# Patient Record
Sex: Male | Born: 1940
Health system: Southern US, Community
[De-identification: ages and names within clinical notes are randomized; demographics above are authoritative.]

## PROBLEM LIST (undated history)

## (undated) DIAGNOSIS — E039 Hypothyroidism, unspecified: Secondary | ICD-10-CM

## (undated) DIAGNOSIS — Z951 Presence of aortocoronary bypass graft: Secondary | ICD-10-CM

## (undated) DIAGNOSIS — Z9889 Other specified postprocedural states: Secondary | ICD-10-CM

## (undated) DIAGNOSIS — R112 Nausea with vomiting, unspecified: Secondary | ICD-10-CM

## (undated) DIAGNOSIS — I2510 Atherosclerotic heart disease of native coronary artery without angina pectoris: Secondary | ICD-10-CM

## (undated) DIAGNOSIS — Z87442 Personal history of urinary calculi: Secondary | ICD-10-CM

## (undated) DIAGNOSIS — M1990 Unspecified osteoarthritis, unspecified site: Secondary | ICD-10-CM

## (undated) DIAGNOSIS — I10 Essential (primary) hypertension: Secondary | ICD-10-CM

## (undated) DIAGNOSIS — E785 Hyperlipidemia, unspecified: Secondary | ICD-10-CM

## (undated) DIAGNOSIS — G4733 Obstructive sleep apnea (adult) (pediatric): Secondary | ICD-10-CM

## (undated) HISTORY — PX: CHOLECYSTECTOMY: SHX55

## (undated) HISTORY — DX: Atherosclerotic heart disease of native coronary artery without angina pectoris: I25.10

## (undated) HISTORY — DX: Unspecified osteoarthritis, unspecified site: M19.90

## (undated) HISTORY — DX: Essential (primary) hypertension: I10

## (undated) HISTORY — DX: Obstructive sleep apnea (adult) (pediatric): G47.33

## (undated) HISTORY — DX: Hyperlipidemia, unspecified: E78.5

## (undated) HISTORY — PX: JOINT REPLACEMENT: SHX530

## (undated) HISTORY — DX: Presence of aortocoronary bypass graft: Z95.1

## (undated) HISTORY — DX: Hypothyroidism, unspecified: E03.9

---

## 1988-05-09 DIAGNOSIS — I2510 Atherosclerotic heart disease of native coronary artery without angina pectoris: Secondary | ICD-10-CM

## 1988-05-09 HISTORY — DX: Atherosclerotic heart disease of native coronary artery without angina pectoris: I25.10

## 1988-08-04 HISTORY — PX: CORONARY ANGIOPLASTY: SHX604

## 1988-10-21 HISTORY — PX: CARDIAC CATHETERIZATION: SHX172

## 1988-12-02 HISTORY — PX: CORONARY ANGIOPLASTY: SHX604

## 2000-03-20 ENCOUNTER — Encounter: Payer: Self-pay | Admitting: Cardiology

## 2000-03-20 ENCOUNTER — Encounter: Admission: RE | Admit: 2000-03-20 | Discharge: 2000-03-20 | Payer: Self-pay | Admitting: Cardiology

## 2000-03-28 ENCOUNTER — Ambulatory Visit (HOSPITAL_COMMUNITY): Admission: RE | Admit: 2000-03-28 | Discharge: 2000-03-28 | Payer: Self-pay | Admitting: Cardiology

## 2000-03-28 HISTORY — PX: CARDIAC CATHETERIZATION: SHX172

## 2000-05-09 DIAGNOSIS — Z951 Presence of aortocoronary bypass graft: Secondary | ICD-10-CM

## 2000-05-09 HISTORY — PX: CORONARY ARTERY BYPASS GRAFT: SHX141

## 2000-05-09 HISTORY — DX: Presence of aortocoronary bypass graft: Z95.1

## 2000-05-15 ENCOUNTER — Encounter: Payer: Self-pay | Admitting: Cardiothoracic Surgery

## 2000-05-17 ENCOUNTER — Inpatient Hospital Stay (HOSPITAL_COMMUNITY): Admission: RE | Admit: 2000-05-17 | Discharge: 2000-05-22 | Payer: Self-pay | Admitting: Cardiothoracic Surgery

## 2000-05-17 ENCOUNTER — Encounter: Payer: Self-pay | Admitting: Cardiothoracic Surgery

## 2000-05-18 ENCOUNTER — Encounter: Payer: Self-pay | Admitting: Cardiothoracic Surgery

## 2000-05-19 ENCOUNTER — Encounter: Payer: Self-pay | Admitting: Cardiothoracic Surgery

## 2000-05-21 ENCOUNTER — Encounter: Payer: Self-pay | Admitting: Cardiothoracic Surgery

## 2000-05-22 ENCOUNTER — Encounter: Payer: Self-pay | Admitting: Cardiothoracic Surgery

## 2000-06-12 ENCOUNTER — Encounter: Payer: Self-pay | Admitting: Cardiology

## 2000-06-12 ENCOUNTER — Encounter: Admission: RE | Admit: 2000-06-12 | Discharge: 2000-06-12 | Payer: Self-pay | Admitting: Cardiology

## 2001-06-06 ENCOUNTER — Ambulatory Visit (HOSPITAL_COMMUNITY): Admission: RE | Admit: 2001-06-06 | Discharge: 2001-06-06 | Payer: Self-pay | Admitting: Gastroenterology

## 2005-12-15 ENCOUNTER — Encounter: Admission: RE | Admit: 2005-12-15 | Discharge: 2005-12-15 | Payer: Self-pay | Admitting: Family Medicine

## 2010-01-07 HISTORY — PX: NM MYOVIEW LTD: HXRAD82

## 2010-03-11 ENCOUNTER — Encounter: Admission: RE | Admit: 2010-03-11 | Discharge: 2010-03-11 | Payer: Self-pay | Admitting: Cardiology

## 2010-03-17 ENCOUNTER — Ambulatory Visit (HOSPITAL_COMMUNITY): Admission: RE | Admit: 2010-03-17 | Discharge: 2010-03-17 | Payer: Self-pay | Admitting: Cardiology

## 2010-03-17 HISTORY — PX: CARDIAC CATHETERIZATION: SHX172

## 2010-07-20 LAB — T3: T3, Total: 67.7 ng/dl — ABNORMAL LOW (ref 80.0–204.0)

## 2010-07-20 LAB — T4: T4, Total: 8.2 ug/dL (ref 5.0–12.5)

## 2010-09-24 NOTE — H&P (Signed)
[place]. [facility]  Patient:    [name] [name], [name] [name]                         MRN: [id number] Adm. Date:  05/17/00 Attending:  Gwenith Daily. Tyrone Sage, M.D. Dictator:   Marlowe Kays, P.A. CC:         Desma Maxim, M.D.             Thereasa Solo. Little, M.D.                         History and Physical  DATE OF BIRTH:  12/07/1940  CHIEF COMPLAINT:  Coronary artery disease.  HISTORY OF PRESENT ILLNESS:  [name] [name] is a pleasant 70 year old white male referred by Dr. Clarene Duke for evaluation of CAD. The patient has known CAD first diagnosed in 1990, having had an angioplasty of the RCA in 1990. In 1999, he had a positive treadmill test, but refused catheterization. Since October 2001, he noticed increase in shortness of breath, especially with exertion. He also notices a burning sensation in his chest without radiation. No nausea or diaphoresis. A cardiac catheterization on March 28, 2000 by Dr. Clarene Duke demonstrated 3-vessel CAD with 80% middle AD, 50-60% proximal circumflex, normal OM1 and 80-90% proximal OM2. The RCA is diffusely diseased in the mid and distal portions with areas of obstruction up to 80% along the acute marginal. The PDA is normal. Overall ______ ventricular function is present. Based on the signs and symptoms, as well as the lab findings, Dr. Tyrone Sage recommended CABG as the best treatment. He complains of occasional tingling bilaterally in his hands. Other than the mention above, no other cardiac complaints are experienced by him.  PAST MEDICAL HISTORY: 1. 3-vessel CAD. 2. Hypertension. 3. Hypercholesterolemia. 4. Ejection fraction 55%. 5. COPD. 6. Status post hyperthyroid crisis, remote. 7. Hypothyroidism. 8. History of pneumonia, 1981.  MEDICATIONS:  Aspirin 81 mg p.o. q.d., lipitor 20 mg p.o. q.d., Monopril 10 mg p.o. q.d., tiazac 180 mg p.o. q.d., Toprol XL 25 mg p.o. q.d., and Levothroid 100 mcg.  PAST SURGICAL HISTORY:  Status  post cardiac catheterization March 28, 2000, status post cholecystectomy 1997, and status post PTCA of the RCA in 1990.  ALLERGIES:  The patient has a questionable reaction to dye (hyperactivity of the thyroid).  REVIEW OF SYSTEMS:  See HPI and past medical history for significant positives. No diabetes. No kidney disease.  FAMILY HISTORY:  Mother dead CAD, hypertension, history of breast cancer. Father dead CAD, hypertension, age 35.  SOCIAL HISTORY:  Married. She has one daughter with MVP. 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 He denies any tobacco or alcohol intake. In fact, he quit in 1990 the use of tobacco, as he used to smoke 1 pack a day for over 40 years.  PHYSICAL EXAMINATION:  GENERAL:  Well-developed, well-nourished, 70 year old, white male in no acute distress. Alert and oriented x 3.  VITAL SIGNS:  Blood pressure 140/80, pulse 64, respirations 16.  HEENT:  Normocephalic, atraumatic. PERRLA. EOMI. Funduscopic exam within normal limits.  NECK:   Supple, no JVD, bruits, thyromegaly or lymphadenopathy.  CHEST:  Symmetrical on inspiration.  LUNGS:  Clear to auscultation bilaterally with distant sounds. Respirations nonlabored.  CARDIOVASCULAR:  Regular rate and rhythm, a 2/6 harsh murmur loudest at the left sternal border between the third and fourth intercostal.  ABDOMEN:  Soft, nontender. Bowel sounds x 4,  no hepatosplenomegaly, masses palpable or abdominal bruits.  GU/RECTAL:  Deferred.  EXTREMITIES:  No cyanosis, clubbing or edema.  SKIN:  Shows no ulcerations or warm temperature. Normal hair pattern. PERIPHERAL PULSES:  Carotid 2+ bilaterally without bruits. Femoral 2+ bilaterally without bruits. Popliteal, dorsalis pedis and posterior tibialis 2+ bilaterally.  NEUROLOGIC:  Grossly normal. Normal gait. DTRs 2+ bilaterally. Muscle strength 5/5.  ASSESSMENT/PLAN:  A 70 year old white male with 3-vessel CAD who will undergo CABG on May 17, 2000 by Dr. Tyrone Sage. Dr. Tyrone Sage has seen and evaluated this patient prior to the admission and has explained the risks and benefits involved in the procedure and the patient has agreed to continue. DD:  05/17/00 TD:  05/17/00 Job: [id number]

## 2010-09-24 NOTE — Op Note (Signed)
[place]. [facility]  Patient:    [name] [name], [name] [name]                       MRN: [id number] Proc. Date: 05/17/00 Adm. Date:  [id number] Attending:  Waldo Laine CC:         Thereasa Solo. Little, M.D.   Operative Report  PREOPERATIVE DIAGNOSIS:  Coronary occlusive disease.  POSTOPERATIVE DIAGNOSIS:  Coronary occlusive disease.  OPERATION PERFORMED:  Coronary artery bypass grafting x 3 with left internal mammary artery to the left anterior descending coronary artery, reversed saphenous vein to the first obtuse marginal coronary artery, reversed saphenous vein graft to the posterior descending coronary artery. SURGEON:  Gwenith Daily. Tyrone Sage, M.D.  FIRST ASSISTANT:  Mikey Bussing, M.D.  SECOND ASSISTANT:  Loura Pardon, P.A.  ANESTHESIA:  General.  INDICATIONS FOR PROCEDURE:  The patient is a 70 year old male who presented with increasing anginal discomfort and sought medical attention from Dr. Clarene Duke who performed cardiac catheterization.  This demonstrated diffuse three vessel coronary artery disease with 70 to 80% stenosis of the LAD, proximal circumflex, a small second obtuse marginal of 80%, a right coronary artery with sequential 70 to 80% stenoses.  Overall ventricular function appeared preserved.  Because of the patients three-vessel coronary artery disease, coronary artery bypass grafting was recommended.  The patient agreed and signed informed consent.  Originally, he had been scheduled for surgery in December; however, called back and requested that we wait until January.  DESCRIPTION OF PROCEDURE:  The patient underwent general endotracheal anesthesia without incident.  The skin of the chest and legs was prepped with Betadine and draped in the usual sterile manner.  Vein was harvested first from the right lower extremity and was of excellent quality and caliiber.  A median sternotomy was performed and the left internal mammary artery  was dissected down as a pedicle graft.  The distal artery was divided and had good free flow.  The pericardium was opened.  Overall ventricular function appeared preserved.  The patient was systemically heparinized.  The ascending aorta and the right atrium were cannulated in the aortic root.  A bent cardioplegia needle was introduced into the ascending aorta.  The patient was placed on cardiopulmonary bypass at 2.4 L per minute per meter squared.  Sites for anastomosis were selected and dissected out of the epicardium.  The patients body temperature was then cooled to 30 degrees.  The aortic crossclamp was applied.  500 cc of cold blood potassium cardioplegia was administered with rapid diastolic arrest of the heart.  Myocardial and septal temperature was monitored throughout the crossclamp period.  Attention was turned first to the circumflex coronary artery.  The first OM was a very large dominant vessel on the lateral wall.  This vessel was opened and admitted a 1.5 mm probe without difficulty.  Using a running 7-0 Prolene and distal anastomosis was performed.  A much smaller second obtuse marginal was evident.  This appeared to be less than 1 mm in size and thought too small for bypass.  Attention was then turned to the posterior descending coronary artery, which was opened and admitted a 1.5 mm probe.  Using a running 7-0 Prolene suture, distal anastomosis was performed with a segment of reversed saphenous vein graft.  Attention was then turned to the left anterior descending coronary artery between the mid and distal third.  The vessel was opened and admitted a 1.5 mm probe.  Using  a running 8.0 Prolene, the left internal mammary artery was anastomosed to the left anterior descending coronary artery.  With release of the Edwards bulldog on the mammary artery, there was appropriate rise in myocardial septal temperature.  The aortic cross clamp was removed.  Total cross clamp time was  46 minutes.  The patient spontaneously converted to a sinus rhythm.   A partial occlusion clamp was placed on the ascending aorta.  Two punch aortotomies were performed.  Each of the two vein grafts was anastomosed to the ascending aorta.  Air was evacuated from the grafts.  The partial occlusion clamp was removed.  Sites of anastomosis were inspected and free of bleeding.  The patient was then ventilated and weaned from cardiopulmonary bypass without difficulty.  He remained hemodynamically stable on low-dose dopamine and was decannulated in the usual fashion.  Protamine sulfate was administered.  With the operative field hemostatic, two atrial and two ventricular pacing wires were applied. Graft markers were applied.  A left pleural tube and two mediastinal tubes were left in place.  The sternum was closed with #6 stainless steel wire, the fascia was closed with interrupted 0 Vicryl and running 3-0 Vicryl in the subcutaneous tissue and 4-0 subcuticular stitch in the skin edges.  Dry dressings were applied.  The patient was transferred to the surgical intensive care unit for further postoperative care.  It should be noted that the patients left pleural space was totally obliterated which is consistent with findings of scarring on his chest x-ray on the left. DD:  05/17/00 TD:  05/17/00 Job: [id number]

## 2010-09-24 NOTE — Cardiovascular Report (Signed)
[place]. [facility]  Patient:    [name] [name], [name] [name]                       MRN: [id number] Proc. Date: 03/28/00 Adm. Date:  [id number] Attending:  Loreli Dollar CC:         Desma Maxim, M.D.  Cardiac Catheterization Lab  CVTS   Cardiac Catheterization  INDICATIONS FOR TEST:  [name] [name] is a 70 year old male who had angioplasty to his RCA in 1990.  He had a positive treadmill in 1999 but refused cardiac catheterization at that time.  He now has developed exertional chest pain and comes in for outpatient cardiac catheterization.  PROCEDURE: 1. Left heart catheterization. 2. Selective right and left coronary arteriography. 3. Ventriculography in the RAO projection.  COMPLICATIONS:  None.  EQUIPMENT:  The 6-French Judkins configuration catheters.  DESCRIPTION OF PROCEDURE:  The patient was prepped and draped in the usual sterile fashion exposing the right groin.  Applying local anesthetic of 1% Xylocaine, the Seldinger technique was employed, and a 6-French introducer sheath was placed into the right femoral artery.  Selective right and left coronary arteriography and ventriculography in the RAO projection was performed.  RESULTS: 1. Hemodynamic monitoring:  Central aortic pressure was 135/68.  Left    ventricular pressure was 135/15 with no aortic valve gradient noted at the    time of pullback. 2. Ventriculography:  Ventriculography in the RAO projection revealed slight    dilatation of the left ventricular cavity.  There was normal left    ventricular systolic function.  The ejection fraction was greater than 55%.    The end diastolic pressure was 16. 3. Coronary arteriography:  There was dense calcification in the left main,    LAD, circumflex, and right coronary artery.    a. Left main:  Normal.    b. LAD:  The LAD had an area of mid 80% narrowing with proximal       irregularities.       The first diagonal also was small with mild  irregularity.    c. Circumflex:  The circumflex had mild irregularities in the mid and       distal portion.  The first OM was a large vessel and was free of       disease.  The second OM was small with a proximal 80-90% narrowing and       diffuse disease within the OM itself.    d. RCA:  The RCA was diffusely diseased in its mid and distal portion with       the maximum obstruction being about 80% at the acute margin of the       heart.  The PDA was free of disease.  The posterolateral branches were       free of disease.  CONCLUSIONS: 1. Dense calcific three-vessel coronary disease. 2. Normal left ventricular systolic function.  At this point, the patient will be referred to CVTS for consideration of bypass surgery.  He will be treated as an outpatient today since his lesions are noncritical although severe.DD:  03/28/00 TD:  03/28/00 Job: [id number]

## 2010-09-24 NOTE — Discharge Summary (Signed)
[name] [name]. [name]  Patient:    [name], [name] [name]                       MRN: [id number] Adm. Date:  [id number] Disc. Date: 05/22/00 Attending:  Waldo Laine Dictator:   Lissa Merlin, P.A. CC:         Thereasa Solo. Little, M.D.  Desma Maxim, M.D.   Discharge Summary  DATE OF BIRTH:  2040-07-06  CARDIOLOGIST:  Thereasa Solo. Little, M.D.  PRIMARY CARE PHYSICIAN:  Desma Maxim, M.D.  PRIMARY ADMISSION DIAGNOSES: 1. Unstable angina. 2. Progressive coronary artery disease. 3. Dyspnea on exertion.  PAST MEDICAL HISTORY:  1. Known three-vessel coronary artery disease with previous percutaneous interventions.  2. Ejection fraction 55%. 3. Hypertension.  4. Hypercholesterolemia.  5. Chronic obstructive pulmonary disease.  6. Hypothyroidism.  7. 40-pack-year smoker.  8. No history of history of hypertension.  PROCEDURES:  CABG x 3 on May 17, 2000, with the following grafts:  Left internal mammary artery to left anterior descending, saphenous vein graft to first obtuse marginal, saphenous vein graft to posterior descending.  NEW DIAGNOSES THIS ADMISSION: 1. Approximately 20% right pneumothorax diagnosed on May 19, 2000,    improved. 2. Mild postoperative anemia, improved.  BRIEF HISTORY:  [name] [name] is a pleasant, 70 year old, white male with known coronary artery disease and previous percutaneous interventions.  Since October of 2001, he complained of increasing shortness of breath, especially with exertion and a burning sensation in his chest.  A repeat cardiac catheterization on March 28, 2000, by Gaspar Garbe B. Little, M.D., showed progression of his disease.  He was referred to [facility] B. Tyrone Sage, M.D., for surgical evaluation.  After reviewing the catheterization data and examining [name] [name] Tyrone Sage, M.D., recommended CABG as the best treatment alternative.  The risks, benefits, details, and alternatives of surgery  were discussed and it was agreed to proceed.  HOSPITAL COURSE:  [name] [name] came to [facility] for the elective procedure as scheduled on May 17, 2000.  There were no complications.  He was transferred to the SICU in stable condition. Postoperatively, [name] [name] responded well to routine care and was transferred to unit 2000 on postoperative day #1.  On postoperative day #2, an approximately 20% right pneumothorax was observed on chest x-ray.  This did not result in any clinical respiratory problems.  [name] [name] continued to do well.  A follow-up chest x-ray showed improvement of the pneumothorax.  By postoperative day #4, [name] [name] is doing very well.  He feels well with no complaints.  He is walking and taking p.o. well.  His bowels are moving.  The vital signs are stable.  He is afebrile.  He is in sinus rhythm.  He is almost back to his preoperative weight.  The anemia is stable.  The physical exam is satisfactory.  The wounds are healing well.  It is anticipated that he will be suitable for discharge pending satisfactory morning rounds on Monday, May 22, 2000.  DISCHARGE MEDICATIONS: 1. Toprol XL 25 mg one p.o. q.a.m. 2. Synthroid 100 mcg one p.o. q.d. 3. Enteric-coated aspirin 81 mg one p.o. q.d. 4. Folic acid 1 mg p.o. q.d. 5. Lipitor 20 mg one p.o. q.d. 6. Tylox one to two p.o. q.4-6h. p.r.n. for pain.  ALLERGIES:  SEPTRA, DOXYCYCLINE, and CONTRAST DYE.  ACTIVITY:  [name] [name] is told to do no heavy lifting over 10 pounds and no  driving.  He is told to walk daily and use his incentive spirometer daily.  DIET:  He is told to maintain a low-fat, low-salt, and low-cholesterol diet.  WOUND CARE:  He is told he can shower and to use mild soap and water only.  To call the office if he notices anything unusual with his wound, such as increasing redness, swelling, drainage, or fever.  He is told to get a chest x-ray when he sees Gaspar Garbe B. Little, M.D., his  cardiologist, in two weeks and to bring it with him to see Ramon Dredge B. Tyrone Sage, M.D.  CONDITION ON DISCHARGE:  He is stable and improved.  FOLLOW-UP:  1. [name] [name] is instructed to call Gaspar Garbe B. Little, M.D., to arrange a two-week appointment.  2. [name] [name] will see Gwenith Daily. Tyrone Sage, M.D., in three weeks.  The office will call with the appointment day and time. He is told to get a chest x-ray when he sees Gaspar Garbe B. Little, M.D., his cardiologist, in two weeks and to bring it with him to see Ramon Dredge B. Tyrone Sage, M.D. DD:  05/21/00 TD:  05/21/00 Job: [id number]

## 2010-09-24 NOTE — Procedures (Signed)
[facility]  Patient:    [name] [name], [name] [name] Visit Number: [id number] MRN: [id number]          Service Type: Attending:  Verlin Grills, M.D. Dictated by:   Verlin Grills, M.D. Proc. Date: 06/06/01                             Procedure Report  REFERRING PHYSICIAN:  ______  PROCEDURE INDICATION:  Mr. [name] [name] is a 70 year old male born June 09, 1940.  Mr. [name] [name] is referred to me for his first screening colonoscopy with polypectomy to prevent colon cancer.  Mr. [name] [name] reviewed our colonoscopy education film in my office, and I discussed with him the complications associated with colonoscopy and polypectomy including a 15 per 1,000 risk of bleeding and 4 per 1,000 risk of colon perforation requiring surgical repair.  Mr. [name] [name] has signed the operative permit.  MEDICATION ALLERGIES:  SULFA, DOXYCYCLINE.  CHRONIC MEDICATIONS:  Levothroid, Lipitor, aspirin, multivitamin, Etodolac, Toprol, vitamin E.  PAST MEDICAL HISTORY:  Cholecystectomy, coronary artery bypass surgery, hypercholesterolemia, hypertension, hypothyroidism.  FAMILY HISTORY: Negative for colon cancer.  ENDOSCOPIST:  Verlin Grills, M.D.  PREMEDICATION:  Versed 7 mg, Demerol 50 mg.  ENDOSCOPE:  Olympus pediatric colonoscope.  PROCEDURE:  After obtaining informed consent, [name] [name] was placed in the left lateral decubitus position.  I administered intravenous Demerol and intravenous Versed to achieve conscious sedation for the procedure.  The patients blood pressure, oxygen saturation, and cardiac rhythm were monitored throughout the procedure and documented in the medical record.  Anal inspection was normal.  Digital rectal exam was normal.  The prostate was non-nodular.  The Olympus pediatric video colonoscope was introduced into the rectum and easily advanced to the cecum.  A normal-appearing ileocecal valve was intubated and the distal ileum inspected.   Colonic preparation for the exam today was excellent.  Rectum:  Normal.  Sigmoid colon and descending colon:  Normal.  Splenic flexure:  Normal.  Transverse colon:  Normal.  Hepatic flexure: Normal.  Ascending colon: Normal.  Cecum and ileocecal valve:  Normal.  Distal ileum:  Normal.  ASSESSMENT: 1. Normal screening proctocolonoscopy to the cecum with intubation of a normal-appearing ileocecal valve and distal ileal inspection.  No endoscopic evidence for the presence of colorectal neoplasia. Dictated by:   Verlin Grills, M.D. Attending:  Verlin Grills, M.D. DD:  06/06/01 TD:  06/06/01 Job: [id number]

## 2010-09-24 NOTE — H&P (Signed)
[place]. [facility]  Patient:    [name] [name], ROUTE                       MRN: [id number] Adm. Date:  [id number] Attending:  Loreli Dollar Dictator:   Kingsley Plan, P.A. CC:         Desma Maxim, M.D.   History and Physical  CHIEF COMPLAINT:  Elective cardiac catheterization.  HISTORY OF PRESENT ILLNESS:  Patient was seen in our office on March 20, 2000 for follow-up of a CAD.  Patient has not been seen since March 1999.  At that time, he had a treadmill test that was positive for inferolateral ischemia.  He was asymptomatic.  He refused any type of cardiac catheterization at that time, and has not been seen since.  He was seen several weeks ago at Dr. Fonnie Birkenhead office and had changes on his EKG, and was referred back here to our office.  He states he has had some burning in his chest, usually with anxiety or if he had a meal and then tried to do some physical activity such as carrying his garbage can to the street.  This burning sensation is similar to what he had in 1990 when he had his angioplasty to his RCA.  Dr. Clarene Duke reviewed his EKG, which showed left anterior fascicular block, but is very similar to prior cardiograms in our chart.  He is active but not exercising regularly.  He is not smoking cigarettes.  He is taking his medications ______.  His antihypertensive drugs, according to the patient, have recently been changed to Tiazac and Monopril because of a gradual increase in his blood pressure.  PAST MEDICAL HISTORY: 1. Cholecystectomy in 1997. 2. Angioplasty in 1999 x 2 with restenosis in three months, with last    angioplasty successful with no current anginal complaints. 3. LV function normal. 4. 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 Dagoberto Ligas. 5. Hyperlipidemia.  FAMILY HISTORY:  Positive for heart disease in his mother and father.  They were also both hypertensive.  Mother had  breast cancer.  SOCIAL HISTORY:  The patient is married, has one adult child.  Has two years of college.  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  No cigarettes, no alcohol.  REVIEW OF SYSTEMS:  Weight is stable.  No claudication.  Rare nocturia.  No dysuria.  Appetite good.  No fever or chills.  PHYSICAL EXAMINATION AT THAT TIME:  GENERAL:  Healthy male, no acute distress.  LUNGS:  Completely clear.  CARDIAC:  Regular rate and rhythm with no ectopic beats.  No murmur.  ABDOMEN:  Soft with no masses, no bruits.  EXTREMITIES:  Has good pulses in lower extremities.  No edema.  NECK:  Neck veins are not distended.  No carotid bruits.  ASSESSMENT AND PLAN: 1. Recurrent anginal pain with minimal activity, suspect angina. 2. Hypertension, under adequate control on current medications. 3. Hyperlipidemia. 4. Hypothyroidism on thyroid replacement.  Patient was seen and evaluated by Dr. Clarene Duke and cardiac catheterization was set up for November 20.  Dr. Clarene Duke discussed with both Dr. Dagoberto Ligas and the patient the potential recurrence of hyperthyroid crisis as a result of the contrast media, but there is no way to prophylaxis this.  PROCEDURE:  Left heart catheterization performed on March 28, 2000 by Dr. Caprice Kluver without any difficulty.  There was dense calcification, left main/LAD/circumflex/RCA.  Left  main was normal.  LAD showed mid 80% stenosis. Distal okay.  First diagonal was small with mild irregularities.  Circumflex with mild irregularities in the circumflex (mid and distally).  OM-1 is large and okay.  OM-2 is small with proximal 80-90% and entire OM diseased.  RCA diffusely diseased with mid and distal 80% stenosis.  PDA okay.  LV with normal systolic function with EDP of 16 and EF 55-60%.  RESULTS:  Three-vessel CAD with dense calcification.  Normal LV systolic function.  Patient is in need of bypass surgery and CVTS will consult today.  DISPOSITION:  Patient will be discharged  on the day of catheterization and will follow up with CVTS in their office and to be scheduled for surgery as soon as possible. DD:  03/28/00 TD:  03/28/00 Job: [id number]

## 2010-09-24 NOTE — Op Note (Signed)
[place]. [facility]  Patient:    RORAN, WEGNER                       MRN: [id number] Adm. Date:  [id number] Attending:  Waldo Laine                           Operative Report  NO DICTATION. DD:  05/17/00 TD:  05/17/00 Job: [id number]

## 2011-03-14 ENCOUNTER — Encounter (HOSPITAL_COMMUNITY): Payer: Medicare Other

## 2011-03-14 ENCOUNTER — Ambulatory Visit (HOSPITAL_COMMUNITY)
Admission: RE | Admit: 2011-03-14 | Discharge: 2011-03-14 | Disposition: A | Discharge status: Home / Self Care | Payer: Medicare Other | Source: Ambulatory Visit | Attending: Orthopedic Surgery | Admitting: Orthopedic Surgery

## 2011-03-14 ENCOUNTER — Encounter (HOSPITAL_COMMUNITY): Payer: Self-pay

## 2011-03-14 DIAGNOSIS — Z9089 Acquired absence of other organs: Secondary | ICD-10-CM | POA: Insufficient documentation

## 2011-03-14 DIAGNOSIS — J984 Other disorders of lung: Secondary | ICD-10-CM | POA: Insufficient documentation

## 2011-03-14 DIAGNOSIS — Z951 Presence of aortocoronary bypass graft: Secondary | ICD-10-CM | POA: Insufficient documentation

## 2011-03-14 DIAGNOSIS — Z01812 Encounter for preprocedural laboratory examination: Secondary | ICD-10-CM | POA: Insufficient documentation

## 2011-03-14 DIAGNOSIS — M1710 Unilateral primary osteoarthritis, unspecified knee: Secondary | ICD-10-CM | POA: Insufficient documentation

## 2011-03-14 LAB — CBC
Hemoglobin: 13.4 g/dL (ref 13.0–17.0)
MCH: 31.8 pg (ref 26.0–34.0)
MCV: 90.7 fL (ref 78.0–100.0)
Platelets: 209 10*3/uL (ref 150–400)
RBC: 4.21 MIL/uL — ABNORMAL LOW (ref 4.22–5.81)
WBC: 6 10*3/uL (ref 4.0–10.5)

## 2011-03-14 LAB — COMPREHENSIVE METABOLIC PANEL
ALT: 26 U/L (ref 0–53)
AST: 23 U/L (ref 0–37)
CO2: 27 mEq/L (ref 19–32)
Chloride: 101 mEq/L (ref 96–112)
GFR calc Af Amer: >90 mL/min (ref >90)
GFR calc non Af Amer: 85 mL/min — ABNORMAL LOW (ref >90)
Glucose, Bld: 90 mg/dL (ref 70–99)
Sodium: 136 mEq/L (ref 135–145)
Total Bilirubin: 0.5 mg/dL (ref 0.3–1.2)

## 2011-03-14 LAB — URINALYSIS, ROUTINE W REFLEX MICROSCOPIC
Glucose, UA: NEGATIVE mg/dL
Ketones, ur: NEGATIVE mg/dL
Leukocytes, UA: NEGATIVE
Nitrite: NEGATIVE
Protein, ur: NEGATIVE mg/dL
pH: 6 (ref 5.0–8.0)

## 2011-03-14 LAB — SURGICAL PCR SCREEN: Staphylococcus aureus: NEGATIVE

## 2011-03-14 NOTE — Pre-Procedure Instructions (Addendum)
ekg 11/24/10 on chart

## 2011-03-14 NOTE — Patient Instructions (Signed)
20 SHAFT CORIGLIANO  03/14/2011   Your procedure is scheduled on: 03/21/11  Report to [facility] at 12:00 PM.  Call this number if you have problems the morning of surgery: [phone]   Remember:   Do not eat food:After Midnight.  Do not drink clear liquids: 4 Hours before arrival.(8:00AM)  Take these medicines the morning of surgery with A SIP OF WATER:LEVOTHYROXINE AND             METOPROLOL   Do not wear jewelry, make-up or nail polish.  Do not wear lotions, powders, or perfumes. You may wear deodorant.  Do not shave 48 hours prior to surgery.  Do not bring valuables to the hospital.  Contacts, dentures or bridgework may not be worn into surgery.  Leave suitcase in the car. After surgery it may be brought to your room.  For patients admitted to the hospital, checkout time is 11:00 AM the day of discharge.   Patients discharged the day of surgery will not be allowed to drive home.  Name and phone number of your driver:   Special Instructions: Incentive Spirometry - Practice and bring it with you on the day of surgery. and CHG Shower Use Special Wash: 1/2 bottle night before surgery and 1/2 bottle morning of surgery.   Please read over the following fact sheets that you were given: Blood Transfusion Information, MRSA Information and Surgical Site Infection Prevention

## 2011-03-18 ENCOUNTER — Other Ambulatory Visit: Payer: Self-pay | Admitting: Orthopedic Surgery

## 2011-03-18 NOTE — H&P (Signed)
[name] [name] JR DOB: 09-28-1940  Date of Admission: 03/21/2011  Chief Complaint:  Left Knee pain  History of Present Illness(DREW L Koen Antilla, PA-C; 03/26/2011 11:45 AM) The patient is a 70 year old male who comes in today for a preoperative History and Physical. The patient is scheduled for a left total knee arthroplasty to be performed by Dr. Gus Rankin. Aluisio, MD at [facility] on 03/21/2011 .The patient is being followed for their left knee pain. Symptoms reported today include: swelling (on lateral side sometimes), giving way (patient states that he feels like it is unstable at times) and pain with weightbearing (or walking).    Allergies(DREW L Cal Gindlesperger, PA-C; 2011-03-26 11:56 AM) Doxy *TETRACYCLINES*. hematuria Iodinated Contrast. "wired"  Medications Levothyroxine Metoprolol ER 25mg  Aspirin 81mg  Enalapril 20mg  Atrovastatin 20mg  Vit D Multivitamin Fish Oil 360mg  Co-Q-10 300mg    Family History(DREW L Arianny Pun, PA-C; 03/26/2011 12:00 PM) Father. Deceased, Heart disease, Myocardial infarction, Alzheimer's disease. 36 Mother. Deceased, Heart disease, Myocardial infarction. 92   Social History(DREW L Damaris Geers, PA-C; 03-26-2011 12:01 PM) Tobacco use. Former smoker. Current work status. Retired. Marital status. Married. Children. 1. Living situation. Lives with spouse. Post-Surgical Plans. Plan for home, wife and brother-in-law   Past Surgical History(DREW L Arlisha Patalano, PA-C; 03/26/2011 11:50 AM) Cholecystectomy. 1997 Coronary Artery Bypass, Three. 2002   Other Problems(DREW L Alixander Rallis, PA-C; 26-Mar-2011 12:10 PM) Hypertension Coronary Artery Disease/Heart Disease Sleep Apnea. does not use CPAP Hypercholesterolemia Coronary Artery Bypass Graft Gallbladder Problems Hypothyroidism. secondary to treatment for hyperthyroidism Hyperthyroidism Osteoarthritis Measles. Childhood Mumps. Childhood History of  Pneumonia   Review of Systems(DREW L Crucita Lacorte, PA-C; 26-Mar-2011 12:03 PM) General:Not Present- Chills, Fever, Night Sweats, Fatigue, Weight Gain, Weight Loss and Memory Loss. Skin:Not Present- Hives, Itching, Rash, Eczema and Lesions. HEENT:Not Present- Tinnitus, Headache, Double Vision, Visual Loss, Hearing Loss and Dentures. Respiratory:Not Present- Shortness of breath with exertion, Shortness of breath at rest, Allergies, Coughing up blood and Chronic Cough. Cardiovascular:Present- Difficulty Breathing Lying Down (relieved once sitting up). Not Present- Chest Pain, Racing/skipping heartbeats, Murmur, Swelling and Palpitations. Gastrointestinal:Not Present- Bloody Stool, Heartburn, Abdominal Pain, Vomiting, Nausea, Constipation, Diarrhea, Difficulty Swallowing, Jaundice and Loss of appetitie. Male Genitourinary:Present- Blood in Urine (3-4 episodes in the past). Not Present- Urinary frequency, Weak urinary stream, Discharge, Flank Pain, Incontinence, Painful Urination, Urgency, Urinary Retention and Urinating at Night. Musculoskeletal:Present- Joint Swelling and Joint Pain. Not Present- Muscle Weakness, Muscle Pain, Back Pain, Morning Stiffness and Spasms. Neurological:Not Present- Tremor, Dizziness, Blackout spells, Paralysis, Difficulty with balance and Weakness. Psychiatric:Not Present- Insomnia. All other systems negative   Vitals(DREW L Adabella Stanis, PA-C; 26-Mar-2011 12:08 PM) Mar 26, 2011 12:07 PM Weight: 170 lb Height: 69 in Weight was reported by patient. Height was reported by patient. Body Surface Area: 1.94 m Body Mass Index: 25.1 kg/m BP: 162/88 (Sitting, Left Arm, Standard)  03/26/2011 12:06 PM Pulse: 64 (Regular) Resp.: 16 (Unlabored) BP: 170/94 (Sitting, Right Arm, Standard)  GENERAL: Patient is a 70 y.o. male, well-nourished, well-developed, no acute distress. Alert, oriented, cooperative. HENT:  Normocephalic, atraumatic. Pupils round and reactive.  EOMs intact. Glasses. NECK:  Supple, no bruits. CHEST:  Clear to anterior and posterior chest walls. No rhonchi, rales, wheezes. HEART:  Regular, rate and rhythm.  No murmurs.  S1 and S2 noted. ABDOMEN:  Soft, nontender, bowel sounds present. RECTAL/BREAST/GENITALIA:  Not done, not pertinent to present illness. EXTREMITIES:  Left Knee - slight varus, no effusion, marked crepitus, medial more tender than lateral. No instability.  Assessment & Plan(DREW L Dorothe Elmore, PA-C;  03/11/2011 12:04 PM) Osteoarthritis, knee (715.96)  Plan is for a left total knee arthroplasty. Risks and benefits of the surgery have been discussed with the patient and they elect to proceed with surgery.  There are on active contraindications to upcoming procedure such as ongoing infection or progressive neurological disease.

## 2011-03-20 ENCOUNTER — Encounter (HOSPITAL_COMMUNITY): Payer: Self-pay | Admitting: Orthopedic Surgery

## 2011-03-20 MED ORDER — BUPIVACAINE 0.25 % ON-Q PUMP DUAL CATH 300 ML
300.0000 mL | INJECTION | Status: DC
  Administered 2011-03-21: 300 mL
  Filled 2011-03-20: qty 300

## 2011-03-21 ENCOUNTER — Encounter (HOSPITAL_COMMUNITY)
Admission: RE | Disposition: A | Discharge status: Home Health | Payer: Self-pay | Source: Ambulatory Visit | Attending: Orthopedic Surgery

## 2011-03-21 ENCOUNTER — Inpatient Hospital Stay (HOSPITAL_COMMUNITY)
Admission: RE | Admit: 2011-03-21 | Discharge: 2011-03-24 | DRG: 470 | Disposition: A | Discharge status: Home Health | Payer: Medicare Other | Source: Ambulatory Visit | Attending: Orthopedic Surgery | Admitting: Orthopedic Surgery

## 2011-03-21 ENCOUNTER — Encounter (HOSPITAL_COMMUNITY): Payer: Self-pay | Admitting: Anesthesiology

## 2011-03-21 ENCOUNTER — Inpatient Hospital Stay (HOSPITAL_COMMUNITY): Payer: Medicare Other | Admitting: Anesthesiology

## 2011-03-21 ENCOUNTER — Encounter (HOSPITAL_COMMUNITY): Payer: Self-pay | Admitting: *Deleted

## 2011-03-21 DIAGNOSIS — T40605A Adverse effect of unspecified narcotics, initial encounter: Secondary | ICD-10-CM | POA: Diagnosis not present

## 2011-03-21 DIAGNOSIS — I2510 Atherosclerotic heart disease of native coronary artery without angina pectoris: Secondary | ICD-10-CM | POA: Diagnosis present

## 2011-03-21 DIAGNOSIS — R112 Nausea with vomiting, unspecified: Secondary | ICD-10-CM | POA: Diagnosis not present

## 2011-03-21 DIAGNOSIS — E039 Hypothyroidism, unspecified: Secondary | ICD-10-CM | POA: Diagnosis present

## 2011-03-21 DIAGNOSIS — G4730 Sleep apnea, unspecified: Secondary | ICD-10-CM | POA: Diagnosis present

## 2011-03-21 DIAGNOSIS — L299 Pruritus, unspecified: Secondary | ICD-10-CM | POA: Diagnosis not present

## 2011-03-21 DIAGNOSIS — M1712 Unilateral primary osteoarthritis, left knee: Secondary | ICD-10-CM

## 2011-03-21 DIAGNOSIS — M1710 Unilateral primary osteoarthritis, unspecified knee: Principal | ICD-10-CM | POA: Diagnosis present

## 2011-03-21 DIAGNOSIS — R319 Hematuria, unspecified: Secondary | ICD-10-CM | POA: Diagnosis not present

## 2011-03-21 DIAGNOSIS — E871 Hypo-osmolality and hyponatremia: Secondary | ICD-10-CM | POA: Diagnosis not present

## 2011-03-21 DIAGNOSIS — D62 Acute posthemorrhagic anemia: Secondary | ICD-10-CM | POA: Diagnosis not present

## 2011-03-21 DIAGNOSIS — I10 Essential (primary) hypertension: Secondary | ICD-10-CM | POA: Diagnosis present

## 2011-03-21 HISTORY — DX: Other specified postprocedural states: Z98.890

## 2011-03-21 HISTORY — DX: Nausea with vomiting, unspecified: R11.2

## 2011-03-21 HISTORY — PX: TOTAL KNEE ARTHROPLASTY: SHX125

## 2011-03-21 LAB — TYPE AND SCREEN
ABO/RH(D): A POS
Antibody Screen: NEGATIVE

## 2011-03-21 SURGERY — ARTHROPLASTY, KNEE, TOTAL
Anesthesia: Spinal | Site: Knee | Laterality: Left | Wound class: Clean

## 2011-03-21 MED ORDER — BISACODYL 5 MG PO TBEC: 10.0000 mg | DELAYED_RELEASE_TABLET | Freq: Every day | ORAL | Status: DC | PRN

## 2011-03-21 MED ORDER — TEMAZEPAM 15 MG PO CAPS: 15.0000 mg | ORAL_CAPSULE | Freq: Every evening | ORAL | Status: DC | PRN

## 2011-03-21 MED ORDER — FLEET ENEMA 7-19 GM/118ML RE ENEM: 1.0000 | ENEMA | Freq: Every day | RECTAL | Status: DC | PRN

## 2011-03-21 MED ORDER — DIPHENHYDRAMINE HCL 50 MG/ML IJ SOLN: 12.5000 mg | Freq: Four times a day (QID) | INTRAMUSCULAR | Status: DC | PRN

## 2011-03-21 MED ORDER — METOPROLOL SUCCINATE ER 25 MG PO TB24
25.0000 mg | ORAL_TABLET | ORAL | Status: DC
  Administered 2011-03-22 – 2011-03-24 (×3): 25 mg via ORAL
  Filled 2011-03-21 (×4): qty 1

## 2011-03-21 MED ORDER — FENTANYL CITRATE 0.05 MG/ML IJ SOLN
INTRAMUSCULAR | Status: DC | PRN
  Administered 2011-03-21: 100 ug via INTRAVENOUS

## 2011-03-21 MED ORDER — LEVOTHYROXINE SODIUM 112 MCG PO TABS
112.0000 ug | ORAL_TABLET | ORAL | Status: DC
  Administered 2011-03-22 – 2011-03-24 (×3): 112 ug via ORAL
  Filled 2011-03-21 (×4): qty 1

## 2011-03-21 MED ORDER — DIPHENHYDRAMINE HCL 12.5 MG/5ML PO ELIX
12.5000 mg | ORAL_SOLUTION | Freq: Four times a day (QID) | ORAL | Status: DC | PRN
  Administered 2011-03-22: 25 mg via ORAL
  Filled 2011-03-21: qty 5

## 2011-03-21 MED ORDER — KETAMINE HCL 10 MG/ML IJ SOLN
INTRAMUSCULAR | Status: DC | PRN
  Administered 2011-03-21: 20 mg via INTRAVENOUS

## 2011-03-21 MED ORDER — FENTANYL CITRATE 0.05 MG/ML IJ SOLN: 25.0000 ug | INTRAMUSCULAR | Status: DC | PRN

## 2011-03-21 MED ORDER — ACETAMINOPHEN 10 MG/ML IV SOLN
INTRAVENOUS | Status: DC | PRN
  Administered 2011-03-21: 1000 mg via INTRAVENOUS

## 2011-03-21 MED ORDER — OXYCODONE HCL 5 MG PO TABS
5.0000 mg | ORAL_TABLET | ORAL | Status: DC | PRN
  Administered 2011-03-22 (×4): 10 mg via ORAL
  Filled 2011-03-21 (×4): qty 2

## 2011-03-21 MED ORDER — BISACODYL 10 MG RE SUPP: 10.0000 mg | Freq: Every day | RECTAL | Status: DC | PRN

## 2011-03-21 MED ORDER — BUPIVACAINE HCL 0.75 % IJ SOLN
INTRAMUSCULAR | Status: DC | PRN
  Administered 2011-03-21: 14 mg

## 2011-03-21 MED ORDER — ONDANSETRON HCL 4 MG/2ML IJ SOLN
4.0000 mg | Freq: Four times a day (QID) | INTRAMUSCULAR | Status: DC | PRN
  Administered 2011-03-21 – 2011-03-22 (×2): 4 mg via INTRAVENOUS
  Filled 2011-03-21: qty 2

## 2011-03-21 MED ORDER — ONDANSETRON HCL 4 MG/2ML IJ SOLN
4.0000 mg | Freq: Four times a day (QID) | INTRAMUSCULAR | Status: DC | PRN
  Filled 2011-03-21: qty 2

## 2011-03-21 MED ORDER — DEXTROSE-NACL 5-0.9 % IV SOLN
INTRAVENOUS | Status: DC
  Administered 2011-03-21 – 2011-03-22 (×2): via INTRAVENOUS

## 2011-03-21 MED ORDER — CEFAZOLIN SODIUM 1-5 GM-% IV SOLN
INTRAVENOUS | Status: DC | PRN
  Administered 2011-03-21: 1 g via INTRAVENOUS

## 2011-03-21 MED ORDER — ACETAMINOPHEN 650 MG RE SUPP: 650.0000 mg | Freq: Four times a day (QID) | RECTAL | Status: DC | PRN

## 2011-03-21 MED ORDER — ONDANSETRON HCL 4 MG PO TABS: 4.0000 mg | ORAL_TABLET | Freq: Four times a day (QID) | ORAL | Status: DC | PRN

## 2011-03-21 MED ORDER — METHOCARBAMOL 100 MG/ML IJ SOLN
500.0000 mg | Freq: Four times a day (QID) | INTRAVENOUS | Status: DC | PRN
  Filled 2011-03-21 (×2): qty 5

## 2011-03-21 MED ORDER — DIPHENHYDRAMINE HCL 12.5 MG/5ML PO ELIX
12.5000 mg | ORAL_SOLUTION | ORAL | Status: DC | PRN
  Filled 2011-03-21 (×2): qty 5

## 2011-03-21 MED ORDER — METOCLOPRAMIDE HCL 5 MG PO TABS
5.0000 mg | ORAL_TABLET | Freq: Three times a day (TID) | ORAL | Status: DC | PRN
  Filled 2011-03-21: qty 2

## 2011-03-21 MED ORDER — MAGNESIUM HYDROXIDE 400 MG/5ML PO SUSP: 30.0000 mL | Freq: Two times a day (BID) | ORAL | Status: DC | PRN

## 2011-03-21 MED ORDER — PHENOL 1.4 % MT LIQD: 1.0000 | OROMUCOSAL | Status: DC | PRN

## 2011-03-21 MED ORDER — CEFAZOLIN SODIUM 1-5 GM-% IV SOLN
1.0000 g | Freq: Once | INTRAVENOUS | Status: DC
  Administered 2011-03-21: 1 g via INTRAVENOUS

## 2011-03-21 MED ORDER — RIVAROXABAN 10 MG PO TABS
10.0000 mg | ORAL_TABLET | ORAL | Status: DC
Start: 2011-03-22 — End: 2011-03-24
  Administered 2011-03-22 – 2011-03-24 (×3): 10 mg via ORAL
  Filled 2011-03-21 (×3): qty 1

## 2011-03-21 MED ORDER — DOCUSATE SODIUM 100 MG PO CAPS
100.0000 mg | ORAL_CAPSULE | Freq: Two times a day (BID) | ORAL | Status: DC
  Administered 2011-03-21 – 2011-03-24 (×6): 100 mg via ORAL
  Filled 2011-03-21 (×7): qty 1

## 2011-03-21 MED ORDER — METOCLOPRAMIDE HCL 5 MG/ML IJ SOLN: 5.0000 mg | Freq: Three times a day (TID) | INTRAMUSCULAR | Status: DC | PRN

## 2011-03-21 MED ORDER — ENALAPRIL MALEATE 20 MG PO TABS
20.0000 mg | ORAL_TABLET | Freq: Every day | ORAL | Status: DC
  Administered 2011-03-21: 20 mg via ORAL
  Filled 2011-03-21 (×2): qty 1

## 2011-03-21 MED ORDER — BUPIVACAINE ON-Q PAIN PUMP (FOR ORDER SET NO CHG)
INJECTION | Status: DC
  Filled 2011-03-21: qty 1

## 2011-03-21 MED ORDER — MORPHINE SULFATE (PF) 1 MG/ML IV SOLN
INTRAVENOUS | Status: DC
  Administered 2011-03-21: 23:00:00 via INTRAVENOUS
  Administered 2011-03-21: 1 mg via INTRAVENOUS
  Administered 2011-03-21: 12 mg via INTRAVENOUS
  Administered 2011-03-22: 3 mg via INTRAVENOUS
  Administered 2011-03-22: 9 mg via INTRAVENOUS
  Administered 2011-03-22: 1 mg via INTRAVENOUS
  Filled 2011-03-21: qty 25

## 2011-03-21 MED ORDER — POLYETHYLENE GLYCOL 3350 17 G PO PACK
17.0000 g | PACK | Freq: Every day | ORAL | Status: DC | PRN
  Filled 2011-03-21: qty 1

## 2011-03-21 MED ORDER — MIDAZOLAM HCL 5 MG/5ML IJ SOLN
INTRAMUSCULAR | Status: DC | PRN
  Administered 2011-03-21: 2 mg via INTRAVENOUS

## 2011-03-21 MED ORDER — ACETAMINOPHEN 10 MG/ML IV SOLN
1000.0000 mg | Freq: Four times a day (QID) | INTRAVENOUS | Status: AC
  Administered 2011-03-21 – 2011-03-22 (×4): 1000 mg via INTRAVENOUS
  Filled 2011-03-21 (×5): qty 100

## 2011-03-21 MED ORDER — CEFAZOLIN SODIUM 1-5 GM-% IV SOLN
1.0000 g | Freq: Four times a day (QID) | INTRAVENOUS | Status: AC
  Administered 2011-03-21 – 2011-03-22 (×3): 1 g via INTRAVENOUS
  Filled 2011-03-21 (×3): qty 50

## 2011-03-21 MED ORDER — METHOCARBAMOL 500 MG PO TABS
500.0000 mg | ORAL_TABLET | Freq: Four times a day (QID) | ORAL | Status: DC | PRN
  Administered 2011-03-22 – 2011-03-23 (×4): 500 mg via ORAL
  Filled 2011-03-21 (×4): qty 1

## 2011-03-21 MED ORDER — MENTHOL 3 MG MT LOZG: 1.0000 | LOZENGE | OROMUCOSAL | Status: DC | PRN

## 2011-03-21 MED ORDER — NALOXONE HCL 0.4 MG/ML IJ SOLN: 0.4000 mg | INTRAMUSCULAR | Status: DC | PRN

## 2011-03-21 MED ORDER — ACETAMINOPHEN 325 MG PO TABS: 650.0000 mg | ORAL_TABLET | Freq: Four times a day (QID) | ORAL | Status: DC | PRN

## 2011-03-21 MED ORDER — SODIUM CHLORIDE 0.9 % IR SOLN
Status: DC | PRN
  Administered 2011-03-21: 1000 mL

## 2011-03-21 MED ORDER — LACTATED RINGERS IV SOLN
INTRAVENOUS | Status: DC
  Administered 2011-03-21: 1000 mL via INTRAVENOUS
  Administered 2011-03-21 (×2): via INTRAVENOUS

## 2011-03-21 MED ORDER — SIMVASTATIN 40 MG PO TABS
40.0000 mg | ORAL_TABLET | Freq: Every day | ORAL | Status: DC
  Administered 2011-03-21 – 2011-03-24 (×3): 40 mg via ORAL
  Filled 2011-03-21 (×4): qty 1

## 2011-03-21 MED ORDER — PROPOFOL 10 MG/ML IV EMUL
INTRAVENOUS | Status: DC | PRN
  Administered 2011-03-21: 50 ug/kg/min via INTRAVENOUS

## 2011-03-21 MED ORDER — SODIUM CHLORIDE 0.9 % IJ SOLN: 9.0000 mL | INTRAMUSCULAR | Status: DC | PRN

## 2011-03-21 SURGICAL SUPPLY — 54 items
BAG SPEC THK2 15X12 ZIP CLS (MISCELLANEOUS) ×1
BAG ZIPLOCK 12X15 (MISCELLANEOUS) ×2 IMPLANT
BANDAGE ELASTIC 6 VELCRO ST LF (GAUZE/BANDAGES/DRESSINGS) ×2 IMPLANT
BANDAGE ESMARK 6X9 LF (GAUZE/BANDAGES/DRESSINGS) ×1 IMPLANT
BLADE SAG 18X100X1.27 (BLADE) ×2 IMPLANT
BLADE SAW SGTL 11.0X1.19X90.0M (BLADE) ×2 IMPLANT
BNDG CMPR 9X6 STRL LF SNTH (GAUZE/BANDAGES/DRESSINGS) ×1
BNDG ESMARK 6X9 LF (GAUZE/BANDAGES/DRESSINGS) ×2
BOWL SMART MIX CTS (DISPOSABLE) ×2 IMPLANT
CATH KIT ON-Q SILVERSOAK 5 (CATHETERS) ×1 IMPLANT
CATH KIT ON-Q SILVERSOAK 5IN (CATHETERS) ×2 IMPLANT
CEMENT HV SMART SET (Cement) ×4 IMPLANT
CLOTH BEACON ORANGE TIMEOUT ST (SAFETY) ×2 IMPLANT
CUFF TOURN SGL QUICK 34 (TOURNIQUET CUFF) ×2
CUFF TRNQT CYL 34X4X40X1 (TOURNIQUET CUFF) ×1 IMPLANT
DRAPE EXTREMITY T 121X128X90 (DRAPE) ×2 IMPLANT
DRAPE POUCH INSTRU U-SHP 10X18 (DRAPES) ×2 IMPLANT
DRAPE U-SHAPE 47X51 STRL (DRAPES) ×2 IMPLANT
DRSG ADAPTIC 3X8 NADH LF (GAUZE/BANDAGES/DRESSINGS) ×2 IMPLANT
DRSG PAD ABDOMINAL 8X10 ST (GAUZE/BANDAGES/DRESSINGS) ×1 IMPLANT
DURAPREP 26ML APPLICATOR (WOUND CARE) ×2 IMPLANT
ELECT REM PT RETURN 9FT ADLT (ELECTROSURGICAL) ×2
ELECTRODE REM PT RTRN 9FT ADLT (ELECTROSURGICAL) ×1 IMPLANT
EVACUATOR 1/8 PVC DRAIN (DRAIN) ×2 IMPLANT
FACESHIELD LNG OPTICON STERILE (SAFETY) ×10 IMPLANT
GLOVE BIO SURGEON STRL SZ7.5 (GLOVE) ×2 IMPLANT
GLOVE BIO SURGEON STRL SZ8 (GLOVE) ×2 IMPLANT
GLOVE BIOGEL PI IND STRL 8 (GLOVE) ×2 IMPLANT
GLOVE BIOGEL PI INDICATOR 8 (GLOVE) ×2
GOWN PREVENTION PLUS XLARGE (GOWN DISPOSABLE) ×2 IMPLANT
GOWN STRL REIN XL XLG (GOWN DISPOSABLE) ×2 IMPLANT
HANDPIECE INTERPULSE COAX TIP (DISPOSABLE) ×2
IMMOBILIZER KNEE 20 (SOFTGOODS) ×2
IMMOBILIZER KNEE 20 THIGH 36 (SOFTGOODS) ×1 IMPLANT
KIT BASIN OR (CUSTOM PROCEDURE TRAY) ×2 IMPLANT
MANIFOLD NEPTUNE II (INSTRUMENTS) ×2 IMPLANT
NS IRRIG 1000ML POUR BTL (IV SOLUTION) ×2 IMPLANT
PACK TOTAL JOINT (CUSTOM PROCEDURE TRAY) ×2 IMPLANT
PAD ABD 7.5X8 STRL (GAUZE/BANDAGES/DRESSINGS) ×2 IMPLANT
PADDING CAST COTTON 6X4 STRL (CAST SUPPLIES) ×6 IMPLANT
PADDING WEBRIL 6 STERILE (GAUZE/BANDAGES/DRESSINGS) ×1 IMPLANT
POSITIONER SURGICAL ARM (MISCELLANEOUS) ×2 IMPLANT
SET HNDPC FAN SPRY TIP SCT (DISPOSABLE) ×1 IMPLANT
SPONGE GAUZE 4X4 12PLY (GAUZE/BANDAGES/DRESSINGS) ×2 IMPLANT
STRIP CLOSURE SKIN 1/2X4 (GAUZE/BANDAGES/DRESSINGS) ×4 IMPLANT
SUCTION FRAZIER 12FR DISP (SUCTIONS) ×2 IMPLANT
SUT MNCRL AB 4-0 PS2 18 (SUTURE) ×2 IMPLANT
SUT PDS AB 1 CT1 27 (SUTURE) ×6 IMPLANT
SUT VIC AB 2-0 CT1 27 (SUTURE) ×6
SUT VIC AB 2-0 CT1 TAPERPNT 27 (SUTURE) ×3 IMPLANT
TOWEL OR 17X26 10 PK STRL BLUE (TOWEL DISPOSABLE) ×4 IMPLANT
TRAY FOLEY CATH 14FRSI W/METER (CATHETERS) ×2 IMPLANT
WATER STERILE IRR 1500ML POUR (IV SOLUTION) ×2 IMPLANT
WRAP KNEE MAXI GEL POST OP (GAUZE/BANDAGES/DRESSINGS) ×3 IMPLANT

## 2011-03-21 NOTE — Interval H&P Note (Signed)
History and Physical Interval Note:   03/21/2011   2:11 PM   Rodman Key  has presented today for surgery, with the diagnosis of Osteoarthritis of Left Knee  The various methods of treatment have been discussed with the patient and family. After consideration of risks, benefits and other options for treatment, the patient has consented to  Procedure(s): TOTAL KNEE ARTHROPLASTY as a surgical intervention .  The patients' history has been reviewed, patient examined, no change in status, stable for surgery.  I have reviewed the patients' chart and labs.  Questions were answered to the patient's satisfaction.     Loanne Drilling  MD  Pt examined. History and physical unchanged  Gus Rankin. Abdulahi Schor, MD    03/21/2011, 2:12 PM

## 2011-03-21 NOTE — Preoperative (Signed)
Beta Blockers   Reason not to administer Beta Blockers:Not Applicable 

## 2011-03-21 NOTE — H&P (View-Only) (Signed)
[name] [name] DOB: 11/28/1940  Date of Admission: 03/21/2011  Chief Complaint:  Left Knee pain  History of Present Illness(DREW L Joe Gee, PA-C; 03/11/2011 11:45 AM) The patient is a 69 year old male who comes in today for a preoperative History and Physical. The patient is scheduled for a left total knee arthroplasty to be performed by Dr. Frank V. Aluisio, MD at [facility] on 03/21/2011 .The patient is being followed for their left knee pain. Symptoms reported today include: swelling (on lateral side sometimes), giving way (patient states that he feels like it is unstable at times) and pain with weightbearing (or walking).    Allergies(DREW L Krissie Merrick, PA-C; 03/11/2011 11:56 AM) Doxy *TETRACYCLINES*. hematuria Iodinated Contrast. "wired"  Medications Levothyroxine 112mcg Metoprolol ER 25mg Aspirin 81mg Enalapril 20mg Atrovastatin 20mg Vit D Multivitamin Fish Oil 360mg Co-Q-10 300mg   Family History(DREW L Damen Windsor, PA-C; 03/11/2011 12:00 PM) Father. Deceased, Heart disease, Myocardial infarction, Alzheimer's disease. 72 Mother. Deceased, Heart disease, Myocardial infarction. 67   Social History(DREW L Alarik Radu, PA-C; 03/11/2011 12:01 PM) Tobacco use. Former smoker. Current work status. Retired. Marital status. Married. Children. 1. Living situation. Lives with spouse. Post-Surgical Plans. Plan for home, wife and brother-in-law   Past Surgical History(DREW L Lilliana Turner, PA-C; 03/11/2011 11:50 AM) Cholecystectomy. 1997 Coronary Artery Bypass, Three. 2002   Other Problems(DREW L Oluwadamilola Deliz, PA-C; 03/11/2011 12:10 PM) Hypertension Coronary Artery Disease/Heart Disease Sleep Apnea. does not use CPAP Hypercholesterolemia Coronary Artery Bypass Graft Gallbladder Problems Hypothyroidism. secondary to treatment for hyperthyroidism Hyperthyroidism Osteoarthritis Measles. Childhood Mumps. Childhood History of  Pneumonia   Review of Systems(DREW L Edwina Grossberg, PA-C; 03/11/2011 12:03 PM) General:Not Present- Chills, Fever, Night Sweats, Fatigue, Weight Gain, Weight Loss and Memory Loss. Skin:Not Present- Hives, Itching, Rash, Eczema and Lesions. HEENT:Not Present- Tinnitus, Headache, Double Vision, Visual Loss, Hearing Loss and Dentures. Respiratory:Not Present- Shortness of breath with exertion, Shortness of breath at rest, Allergies, Coughing up blood and Chronic Cough. Cardiovascular:Present- Difficulty Breathing Lying Down (relieved once sitting up). Not Present- Chest Pain, Racing/skipping heartbeats, Murmur, Swelling and Palpitations. Gastrointestinal:Not Present- Bloody Stool, Heartburn, Abdominal Pain, Vomiting, Nausea, Constipation, Diarrhea, Difficulty Swallowing, Jaundice and Loss of appetitie. Male Genitourinary:Present- Blood in Urine (3-4 episodes in the past). Not Present- Urinary frequency, Weak urinary stream, Discharge, Flank Pain, Incontinence, Painful Urination, Urgency, Urinary Retention and Urinating at Night. Musculoskeletal:Present- Joint Swelling and Joint Pain. Not Present- Muscle Weakness, Muscle Pain, Back Pain, Morning Stiffness and Spasms. Neurological:Not Present- Tremor, Dizziness, Blackout spells, Paralysis, Difficulty with balance and Weakness. Psychiatric:Not Present- Insomnia. All other systems negative   Vitals(DREW L Hilman Kissling, PA-C; 03/11/2011 12:08 PM) 03/11/2011 12:07 PM Weight: 170 lb Height: 69 in Weight was reported by patient. Height was reported by patient. Body Surface Area: 1.94 m Body Mass Index: 25.1 kg/m BP: 162/88 (Sitting, Left Arm, Standard)  03/11/2011 12:06 PM Pulse: 64 (Regular) Resp.: 16 (Unlabored) BP: 170/94 (Sitting, Right Arm, Standard)  GENERAL: Patient is a 69 y.o. male, well-nourished, well-developed, no acute distress. Alert, oriented, cooperative. HENT:  Normocephalic, atraumatic. Pupils round and reactive.  EOMs intact. Glasses. NECK:  Supple, no bruits. CHEST:  Clear to anterior and posterior chest walls. No rhonchi, rales, wheezes. HEART:  Regular, rate and rhythm.  No murmurs.  S1 and S2 noted. ABDOMEN:  Soft, nontender, bowel sounds present. RECTAL/BREAST/GENITALIA:  Not done, not pertinent to present illness. EXTREMITIES:  Left Knee - slight varus, no effusion, marked crepitus, medial more tender than lateral. No instability.  Assessment & Plan(DREW L Vashti Bolanos, PA-C;   03/11/2011 12:04 PM) Osteoarthritis, knee (715.96)  Plan is for a left total knee arthroplasty. Risks and benefits of the surgery have been discussed with the patient and they elect to proceed with surgery.  There are on active contraindications to upcoming procedure such as ongoing infection or progressive neurological disease.  

## 2011-03-21 NOTE — Anesthesia Postprocedure Evaluation (Signed)
  Anesthesia Post-op Note  Patient: [name] [name]  Procedure(s) Performed:  TOTAL KNEE ARTHROPLASTY  Patient Location: PACU  Anesthesia Type: Spinal  Level of Consciousness: awake and alert   Airway and Oxygen Therapy: Patient Spontanous Breathing  Post-op Pain: mild  Post-op Assessment: Post-op Vital signs reviewed, Patient's Cardiovascular Status Stable, Respiratory Function Stable, Patent Airway and No signs of Nausea or vomiting  Post-op Vital Signs: stable  Complications: No apparent anesthesia complications

## 2011-03-21 NOTE — Anesthesia Preprocedure Evaluation (Addendum)
Anesthesia Evaluation  Patient identified by MRN, date of birth, ID band Patient awake    Reviewed: Allergy & Precautions, H&P , NPO status , Patient's Chart, lab work & pertinent test results  Airway Mallampati: III TM Distance: <3 FB Neck ROM: Full    Dental   Pulmonary neg pulmonary ROS, sleep apnea ,    Pulmonary exam normal       Cardiovascular hypertension, + CAD, + CABG and neg cardio ROS Regular Normal    Neuro/Psych Negative Neurological ROS  Negative Psych ROS   GI/Hepatic negative GI ROS, Neg liver ROS,   Endo/Other  Negative Endocrine ROSHypothyroidism   Renal/GU negative Renal ROS  Genitourinary negative   Musculoskeletal negative musculoskeletal ROS (+)   Abdominal   Peds negative pediatric ROS (+)  Hematology negative hematology ROS (+)   Anesthesia Other Findings   Reproductive/Obstetrics negative OB ROS                          Anesthesia Physical Anesthesia Plan  ASA: III  Anesthesia Plan: Spinal   Post-op Pain Management:    Induction:   Airway Management Planned:   Additional Equipment:   Intra-op Plan:   Post-operative Plan:   Informed Consent:   Plan Discussed with:   Anesthesia Plan Comments:         Anesthesia Quick Evaluation

## 2011-03-21 NOTE — Anesthesia Procedure Notes (Signed)

## 2011-03-21 NOTE — Transfer of Care (Signed)
Immediate Anesthesia Transfer of Care Note  Patient: [name] [name]  Procedure(s) Performed:  TOTAL KNEE ARTHROPLASTY  Patient Location: PACU  Anesthesia Type: Regional  Level of Consciousness: alert  and sedated  Airway & Oxygen Therapy: Patient Spontanous Breathing and Patient connected to face mask oxygen  Post-op Assessment: Report given to PACU RN and Post -op Vital signs reviewed and stable  Post vital signs: Reviewed and stable  Complications: No apparent anesthesia complications

## 2011-03-21 NOTE — Op Note (Signed)
Pre-operative diagnosis- Osteoarthritis  Left knee(s)  Post-operative diagnosis- Osteoarthritis Left knee(s)  Surgeon- Gus Rankin. Robbin Escher, MD  Assistant- Avel Peace, PA-C   Anesthesia-  Spinal EBL-* No blood loss amount entered *  Drains Hemovac  Tourniquet time-  Total Tourniquet Time Documented: Thigh (Left) - 40 minutes   Complications- None  Condition-PACU - hemodynamically stable.   Brief Clinical Note   [name] [name] is a 70 y.o. year old male with end stage OA of his left knee with progressively worsening pain and dysfunction. He has constant pain, with activity and at rest and significant functional deficits with difficulties even with ADLs. He has had extensive non-op management including analgesics, injections of cortisone  and home exercise program, but remains in significant pain with significant dysfunction. Radiographs show bone on bone arthritis of the medial and patellofemoral compartments with significant spurs and significant varus deformity. He presents now for left Total Knee Arthroplasty.    Procedure in detail---   The patient is brought into the operating room and positioned supine on the operating table. After successful administration of  Spinal,   a tourniquet is placed high on the  Left thigh(s) and the lower extremity is prepped and draped in the usual sterile fashion. Time out is performed by the operating team and then the  Left lower extremity is wrapped in Esmarch, knee flexed and the tourniquet inflated to 300 mmHg.       A midline incision is made with a ten blade through the subcutaneous tissue to the level of the extensor mechanism. A fresh blade is used to make a medial parapatellar arthrotomy. Soft tissue over the proximal medial tibia is subperiosteally elevated to the joint line with a knife and into the semimembranosus bursa with a Cobb elevator. Soft tissue over the proximal lateral tibia is elevated with attention being paid to avoiding the patellar  tendon on the tibial tubercle. The patella is everted, knee flexed 90 degrees and the ACL and PCL are removed. Findings are bone on bone medial and patellofemoral with large marginal osteophytes medial femur and tibia.        The drill is used to create a starting hole in the distal femur and the canal is thoroughly irrigated with sterile saline to remove the fatty contents. The 5 degree Left  valgus alignment guide is placed into the femoral canal and the distal femoral cutting block is pinned to remove 11 mm off the distal femur. Resection is made with an oscillating saw.      The tibia is subluxed forward and the menisci are removed. The extramedullary alignment guide is placed referencing proximally at the medial aspect of the tibial tubercle and distally along the second metatarsal axis and tibial crest. The block is pinned to remove 2mm off the more deficient medial  side. Resection is made with an oscillating saw. Size 4is the most appropriate size for the tibia and the proximal tibia is prepared with the modular drill and keel punch for that size.      The femoral sizing guide is placed and size 4 is most appropriate. Rotation is marked off the epicondylar axis and confirmed by creating a rectangular flexion gap at 90 degrees. The size 4 cutting block is pinned in this rotation and the anterior, posterior and chamfer cuts are made with the oscillating saw. The intercondylar block is then placed and that cut is made.      Trial size 4 tibial component, trial size 4 posterior  stabilized femur and a 10  mm posterior stabilized rotating platform insert trial is placed. Full extension is achieved with excellent varus/valgus and anterior/posterior balance throughout full range of motion. The patella is everted and thickness measured to be 26  mm. Free hand resection is taken to 14 mm, a 41 template is placed, lug holes are drilled, trial patella is placed, and it tracks normally. Osteophytes are removed off  the posterior femur with the trial in place. All trials are removed and the cut bone surfaces prepared with pulsatile lavage. Cement is mixed and once ready for implantation, the size 4 tibial implant, size   4posterior stabilized femoral component, and the size 41 patella are cemented in place and the patella is held with the clamp. The trial insert is placed and the knee held in full extension. All extruded cement is removed and once the cement is hard the permanent 10 mm posterior stabilized rotating platform insert is placed into the tibial tray.      The wound is copiously irrigated with saline solution and the extensor mechanism closed over a hemovac drain with #1 PDS suture. The tourniquet is released for a total tourniquet time of 42  minutes. Flexion against gravity is 135 degrees and the patella tracks normally. Subcutaneous tissue is closed with 2.0 vicryl and subcuticular with running 4.0 Monocryl. The catheter for the Marcaine pain pump is placed and the pump is initiated. The incision is cleaned and dried and steri-strips and a bulky sterile dressing are applied. The limb is placed into a knee immobilizer and the patient is awakened and transported to recovery in stable condition.      Please note that a surgical assistant was a medical necessity for this procedure in order to perform it in a safe and expeditious manner. Surgical assistant was necessary to retract the ligaments and vital neurovascular structures to prevent injury to them and also necessary for proper positioning of the limb to allow for anatomic placement of the prosthesis.   Gus Rankin Olamide Lahaie, MD    03/21/2011, 3:42 PM

## 2011-03-22 LAB — CBC
MCV: 92.6 fL (ref 78.0–100.0)
Platelets: 182 10*3/uL (ref 150–400)
RDW: 12.2 % (ref 11.5–15.5)
WBC: 9 10*3/uL (ref 4.0–10.5)

## 2011-03-22 LAB — BASIC METABOLIC PANEL
Chloride: 103 mEq/L (ref 96–112)
Creatinine, Ser: 0.76 mg/dL (ref 0.50–1.35)
GFR calc Af Amer: >90 mL/min (ref >90)
Sodium: 134 mEq/L — ABNORMAL LOW (ref 135–145)

## 2011-03-22 LAB — ABO/RH: ABO/RH(D): A POS

## 2011-03-22 MED ORDER — MORPHINE SULFATE 2 MG/ML IJ SOLN
1.0000 mg | INTRAMUSCULAR | Status: DC | PRN
  Administered 2011-03-22: 2 mg via INTRAVENOUS
  Filled 2011-03-22: qty 1

## 2011-03-22 MED ORDER — ENALAPRIL MALEATE 20 MG PO TABS
20.0000 mg | ORAL_TABLET | Freq: Every day | ORAL | Status: DC
  Administered 2011-03-22 – 2011-03-23 (×2): 20 mg via ORAL
  Filled 2011-03-22 (×3): qty 1

## 2011-03-22 MED ORDER — DEXTROSE-NACL 5-0.9 % IV SOLN: INTRAVENOUS | Status: DC

## 2011-03-22 NOTE — Progress Notes (Signed)
Subjective: 1 Day Post-Op Procedure(s) (LRB): TOTAL KNEE ARTHROPLASTY (Left) Patient reports pain as mild.   Patient seen in rounds with Dr. Lequita Halt. Patient has complaints of some pain but doing fairly well this morning.  We will start therapy today. Plan is to go home after hospital stay.  Objective: Vital signs in last 24 hours: Temp:  [97 F (36.1 C)-97.9 F (36.6 C)] 97.6 F (36.4 C) (11/13 0600) Pulse Rate:  [53-91] 91  (11/13 0624) Resp:  [14-18] 18  (11/13 0800) BP: (102-154)/(64-81) 110/64 mmHg (11/13 0624) SpO2:  [97 %-100 %] 97 % (11/13 0800) Weight:  [78.472 kg (173 lb)] 173 lb (78.472 kg) (11/13 0412)  Intake/Output from previous day:  Intake/Output Summary (Last 24 hours) at 03/22/11 0924 Last data filed at 03/22/11 0624  Gross per 24 hour  Intake 4706.67 ml  Output   1275 ml  Net 3431.67 ml    Intake/Output this shift:     Basename 03/22/11 0405  HGB 10.2*    Basename 03/22/11 0405  WBC 9.0  RBC 3.25*  HCT 30.1*  PLT 182    Basename 03/22/11 0405  NA 134*  K 4.7  CL 103  CO2 27  BUN 16  CREATININE 0.76  GLUCOSE 140*  CALCIUM 8.5   No results found for this basename: LABPT:2,INR:2 in the last 72 hours  Exam - Neurovascular intact Sensation intact distally Intact pulses distally Dressing - clean, dry Motor function intact - moving foot and toes well on exam.  Hemovac pulled without difficulty.  Assessment/Plan: 1 Day Post-Op Procedure(s) (LRB): TOTAL KNEE ARTHROPLASTY (Left)  Advance diet Up with therapy Discharge home with home health when met goals Past Medical History  Diagnosis Date  . Nausea   . Coronary artery disease   . Hypertension   . Sleep apnea     UNABLE TO ADJUST TO USING C-PAP  . Hypothyroidism   . Hematuria     OCASIONAL HEMATURIA UNK CAUSE  . Arthritis   . PONV (postoperative nausea and vomiting)     DVT Prophylaxis - Xarelto / Coumadin Protocol Weight-Bearing as tolerated to left leg Keep foley until  tomorrow. No vaccines.  PERKINS, ALEXZANDREW 03/22/2011, 9:24 AM

## 2011-03-22 NOTE — Progress Notes (Signed)
Physical Therapy Treatment Patient Details Name: [name] [name] MRN: [id number] DOB: [birth year] Today's Date: 03/22/2011 [id number] eg PT Assessment/Plan  PT - Assessment/Plan PT Plan: Discharge plan remains appropriate;Frequency remains appropriate PT Frequency: 7X/week Follow Up Recommendations: Home health PT;24 hour supervision/assistance Equipment Recommended: Rolling walker with 5" wheels;3 in 1 bedside comode PT Goals  Acute Rehab PT Goals PT Goal Formulation: With patient Time For Goal Achievement: 7 days Pt will go Supine/Side to Sit: with supervision PT Goal: Supine/Side to Sit - Progress: Progressing toward goal Pt will go Sit to Supine/Side: with supervision PT Goal: Sit to Supine/Side - Progress: Progressing toward goal Pt will Transfer Sit to Stand/Stand to Sit: with supervision;with upper extremity assist PT Transfer Goal: Sit to Stand/Stand to Sit - Progress: Progressing toward goal Pt will Ambulate: >150 feet;with supervision;with rolling walker PT Goal: Ambulate - Progress: Progressing toward goal Pt will Perform Home Exercise Program: with supervision, verbal cues required/provided PT Goal: Perform Home Exercise Program - Progress: Progressing toward goal  PT Treatment Precautions/Restrictions  Precautions Precautions: Knee Required Braces or Orthoses: Yes Knee Immobilizer: Discontinue once straight leg raise with < 10 degree lag Restrictions Weight Bearing Restrictions: No Mobility (including Balance) Bed Mobility Bed Mobility: Yes Supine to Sit: 4: Min assist (30) Sit to Supine - Right: 4: Min assist;HOB flat Transfers Transfers: Yes Sit to Stand: 4: Min assist;With upper extremity assist;From bed Stand to Sit: 4: Min assist;With upper extremity assist;With armrests;To bed Ambulation/Gait Ambulation/Gait: Yes Ambulation/Gait Assistance: 4: Min assist Ambulation/Gait Assistance Details (indicate cue type and reason): VC for sequence Ambulation  Distance (Feet): 80 Feet Assistive device: Rolling walker Gait Pattern: Step-to pattern  Posture/Postural Control Posture/Postural Control: No significant limitations Exercise  Total Joint Exercises Quad Sets: Left;AROM;10 reps;Supine Short Arc Quad: AAROM;Left;10 reps;Supine Heel Slides: AAROM;Left;10 reps;Supine Straight Leg Raises: AAROM;Left;10 reps;Supine End of Session PT - End of Session Equipment Utilized During Treatment: Left knee immobilizer Activity Tolerance: Patient tolerated treatment well;Patient limited by pain Patient left: with call bell in reach;in bed Nurse Communication: Mobility status for transfers;Mobility status for ambulation General Cognition: WFL for tasks performed  [name] 03/22/2011, 5:34 PM

## 2011-03-22 NOTE — Progress Notes (Signed)
Physical Therapy Evaluation Patient Details Name: [name] [name] MRN: [id number] DOB: [birth year] Today's Date: 03/22/2011 [phone] Ev2 Problem List:  Patient Active Problem List  Diagnoses  . Osteoarthritis of left knee    Past Medical History:  Past Medical History  Diagnosis Date  . Nausea   . Coronary artery disease   . Hypertension   . Sleep apnea     UNABLE TO ADJUST TO USING C-PAP  . Hypothyroidism   . Hematuria     OCASIONAL HEMATURIA UNK CAUSE  . Arthritis   . PONV (postoperative nausea and vomiting)    Past Surgical History:  Past Surgical History  Procedure Date  . Coronary artery bypass graft     3 VESSELS 2002  . Cholecystectomy     1997    PT Assessment/Plan/Recommendation PT Assessment PT Recommendation/Assessment: Patient will need skilled PT in the acute care venue PT Problem List: Decreased strength;Decreased range of motion;Decreased activity tolerance;Decreased knowledge of use of DME;Pain;Decreased knowledge of precautions PT Therapy Diagnosis : Difficulty walking;Acute pain PT Plan PT Frequency: 7X/week PT Treatment/Interventions: DME instruction;Gait training;Stair training;Functional mobility training;Patient/family education;Therapeutic exercise PT Recommendation Recommendations for Other Services: OT consult Follow Up Recommendations: Home health PT;24 hour supervision/assistance Equipment Recommended: Rolling walker with 5" wheels;3 in 1 bedside comode PT Goals  Acute Rehab PT Goals PT Goal Formulation: With patient Time For Goal Achievement: 7 days Pt will go Supine/Side to Sit: with supervision PT Goal: Supine/Side to Sit - Progress: Progressing toward goal Pt will go Sit to Supine/Side: with supervision PT Goal: Sit to Supine/Side - Progress: Progressing toward goal Pt will Transfer Sit to Stand/Stand to Sit: with supervision;with upper extremity assist PT Transfer Goal: Sit to Stand/Stand to Sit - Progress: Progressing toward  goal Pt will Ambulate: >150 feet;with supervision;with rolling walker PT Goal: Ambulate - Progress: Progressing toward goal Pt will Perform Home Exercise Program: with supervision, verbal cues required/provided PT Goal: Perform Home Exercise Program - Progress: Progressing toward goal  PT Evaluation Precautions/Restrictions  Precautions Precautions: Knee Required Braces or Orthoses: Yes Knee Immobilizer: Discontinue once straight leg raise with < 10 degree lag Restrictions Weight Bearing Restrictions: No Prior Functioning  Home Living Lives With: Spouse Receives Help From: Family Type of Home: House Home Layout: One level Home Access: Stairs to enter Entrance Stairs-Rails: Right Entrance Stairs-Number of Steps: 5 Home Adaptive Equipment: Straight cane Prior Function Level of Independence: Independent with basic ADLs;Independent with gait;Independent with transfers Able to Take Stairs?: Yes Driving: Yes Vocation: Retired Financial risk analyst Arousal/Alertness: Awake/alert Orientation Level: Oriented X4 Sensation/Coordination Sensation Light Touch: Appears Intact Coordination Gross Motor Movements are Fluid and Coordinated: Yes Fine Motor Movements are Fluid and Coordinated:  (Bil UE) Extremity Assessment RUE Assessment RUE Assessment: Within Functional Limits LUE Assessment LUE Assessment: Within Functional Limits RLE Assessment RLE Assessment: Within Functional Limits LLE Assessment LLE Assessment:  (pt able SLR with no assist,knee flex TBA, pt ready to get OO) Mobility (including Balance) Bed Mobility Bed Mobility: Yes Supine to Sit: 4: Min assist;HOB elevated (Comment degrees) (30) Transfers Transfers: Yes Sit to Stand: 4: Min assist;With upper extremity assist;From bed;From elevated surface Stand to Sit: 4: Min assist;To chair/3-in-1;With upper extremity assist;With armrests Ambulation/Gait Ambulation/Gait: Yes Ambulation/Gait Assistance: 4: Min  assist Ambulation/Gait Assistance Details (indicate cue type and reason): VC for sequence Ambulation Distance (Feet): 100 Feet Assistive device: Rolling walker Gait Pattern: Step-to pattern  Posture/Postural Control Posture/Postural Control: No significant limitations Exercise    End of Session PT - End of Session  Equipment Utilized During Treatment: Left knee immobilizer Activity Tolerance: Patient tolerated treatment well;Patient limited by pain Patient left: in chair;with call bell in reach Nurse Communication: Mobility status for transfers;Mobility status for ambulation General Cognition: WFL for tasks performed  Blanchard Kelch [id number]/13/2012, 5:24 PM

## 2011-03-23 LAB — CBC
MCV: 91.9 fL (ref 78.0–100.0)
Platelets: 155 10*3/uL (ref 150–400)
RDW: 12.2 % (ref 11.5–15.5)
WBC: 8.9 10*3/uL (ref 4.0–10.5)

## 2011-03-23 LAB — BASIC METABOLIC PANEL
Chloride: 100 mEq/L (ref 96–112)
Creatinine, Ser: 0.9 mg/dL (ref 0.50–1.35)
GFR calc Af Amer: >90 mL/min (ref >90)
Potassium: 4.1 mEq/L (ref 3.5–5.1)
Sodium: 132 mEq/L — ABNORMAL LOW (ref 135–145)

## 2011-03-23 MED ORDER — HYDROCODONE-ACETAMINOPHEN 5-325 MG PO TABS
1.0000 | ORAL_TABLET | ORAL | Status: DC | PRN
  Administered 2011-03-23 – 2011-03-24 (×5): 2 via ORAL
  Filled 2011-03-23 (×5): qty 2

## 2011-03-23 NOTE — Progress Notes (Signed)
Occupational Therapy Evaluation Patient Details Name: [name] [name] MRN: [id number] DOB: 05-20-1940 Today's Date: 03/23/2011 Time 10:52-11:25am Problem List:  Patient Active Problem List  Diagnoses  . Osteoarthritis of left knee    Past Medical History:  Past Medical History  Diagnosis Date  . Nausea   . Coronary artery disease   . Hypertension   . Sleep apnea     UNABLE TO ADJUST TO USING C-PAP  . Hypothyroidism   . Hematuria     OCASIONAL HEMATURIA UNK CAUSE  . Arthritis   . PONV (postoperative nausea and vomiting)    Past Surgical History:  Past Surgical History  Procedure Date  . Coronary artery bypass graft     3 VESSELS 2002  . Cholecystectomy     1997    OT Assessment/Plan/Recommendation OT Assessment Clinical Impression Statement: Pt plans to d/c home with 24 hour supervision and PRN assist from wife. Currently requires supervision for transfers and min assist for LB ADL's and self care tasks. Will benefit from 3:1 and otherwise report no further OT needs, will sign off OT. OT Recommendation/Assessment: Patient does not need any further OT services  Equipment Recommended: 3 in 1 bedside comode OT Goals    OT Evaluation Precautions/Restrictions  Precautions Precautions: Knee Required Braces or Orthoses: Yes Knee Immobilizer: Discontinue once straight leg raise with < 10 degree lag Restrictions Weight Bearing Restrictions: No Prior Functioning Home Living Bathroom Shower/Tub: Tub/shower unit;Other (comment) (Pt reports can access tub bench/chair) Bathroom Toilet: Standard Bathroom Accessibility: Yes How Accessible: Accessible via walker Prior Function Level of Independence: Independent with basic ADLs;Independent with transfers ADL ADL Grooming: Performed;Wash/dry hands;Supervision/safety Grooming Details (indicate cue type and reason): VC's for safety with RW and hand positioning Where Assessed - Grooming: Standing at sink;Other (comment) (in  bathroom) Lower Body Bathing: Simulated;Minimal assistance Lower Body Bathing Details (indicate cue type and reason): Pt with decreased ROM L LE, pt/wife educated on LB bathing at [employer] at sink @ home when d/c'd. Pt's wife reports able to provide any needed assistance Where Assessed - Lower Body Bathing: Sitting, bed Upper Body Dressing: Performed;Minimal assistance Upper Body Dressing Details (indicate cue type and reason): Secondary to IV assistance with gown (buttoning around IV) Where Assessed - Upper Body Dressing: Sitting, chair Lower Body Dressing: Performed;Minimal assistance Lower Body Dressing Details (indicate cue type and reason): Don/Doff socks and apply KI L LE Where Assessed - Lower Body Dressing: Supine, head of bed up Toilet Transfer: Performed;Supervision/safety Toilet Transfer Details (indicate cue type and reason): VC's for safety and sequencing with transfer and hand placement Toilet Transfer Method: Ambulating Toilet Transfer Equipment: Raised toilet seat with arms (or 3-in-1 over toilet);Other (comment) (RW) Toileting - Clothing Manipulation: Performed;Supervision/safety Toileting - Clothing Manipulation Details (indicate cue type and reason): VC's for safety and sequencing with transfer and hand placement Where Assessed - Toileting Clothing Manipulation: Sit to stand from 3-in-1 or toilet Toileting - Hygiene: Performed;Supervision/safety Toileting - Hygiene Details (indicate cue type and reason): VC's for safety and sequencing with transfer and hand placement Where Assessed - Toileting Hygiene: Sit on 3-in-1 or toilet Tub/Shower Transfer: Other (comment) (pt has tub/shower plans to sponge bathe initally) Equipment Used: Rolling walker;Other (comment) (L LE KI) ADL Comments: Pt plans to d/c home with 24 hour supervision and PRN assist for lower body ADL's and self care tasks. Pt is currently supervision toileting transfers and Min A LB dress/bathe. They have access to  tub seat/bench and report no further OT needs at this time.  Vision/Perception  Vision - History Baseline Vision: Wears glasses all the time Patient Visual Report: No change from baseline Cognition Cognition Arousal/Alertness: Awake/alert Overall Cognitive Status: Appears within functional limits for tasks assessed Orientation Level: Oriented X4 Sensation/Coordination Sensation Light Touch: Appears Intact Extremity Assessment RUE Assessment RUE Assessment: Within Functional Limits LUE Assessment LUE Assessment: Within Functional Limits Mobility  Bed Mobility Bed Mobility: No Supine to Sit: 4: Min assist Supine to Sit Details (indicate cue type and reason): Min assist to guide L LE to EOB Transfers Transfers: Yes Sit to Stand: 5: Supervision;From elevated surface;With upper extremity assist Sit to Stand Details (indicate cue type and reason): VCs hand placement Stand to Sit: 5: Supervision;To chair/3-in-1 Stand to Sit Details: VCs hand placement Exercises Total Joint Exercises Ankle Circles/Pumps: AROM;15 reps;Supine Quad Sets: AROM;Left;10 reps;Seated Short Arc Quad: AAROM;Left;Strengthening;10 reps;Seated Heel Slides: Left;AAROM;10 reps;Seated Straight Leg Raises: AAROM;Left;10 reps;Seated End of Session OT - End of Session Equipment Utilized During Treatment: Gait belt;Left knee immobilizer;Other (comment) (RW) Activity Tolerance: Patient tolerated treatment well Patient left: in chair;with call bell in reach;with family/visitor present General Behavior During Session: [facility] for tasks performed Cognition: [facility] for tasks performed   Alm Bustard 03/23/2011, 1:40 PM

## 2011-03-23 NOTE — Progress Notes (Signed)
Physical Therapy Treatment Patient Details Name: [name] [name] MRN: [id number] DOB: 1941-04-11 Today's Date: 03/23/2011 13:45-14:00 G  PT Assessment/Plan  PT - Assessment/Plan Comments on Treatment Session: Progressing well PT Plan: Discharge plan remains appropriate PT Frequency: 7X/week Follow Up Recommendations: Home health PT;24 hour supervision/assistance Equipment Recommended: 3 in 1 bedside comode PT Goals  Acute Rehab PT Goals PT Goal Formulation: With patient/family Time For Goal Achievement: 7 days Pt will go Supine/Side to Sit: with supervision PT Goal: Supine/Side to Sit - Progress: Progressing toward goal Pt will go Sit to Supine/Side: with supervision PT Goal: Sit to Supine/Side - Progress: Progressing toward goal Pt will Transfer Sit to Stand/Stand to Sit: with modified independence PT Transfer Goal: Sit to Stand/Stand to Sit - Progress: Met Pt will Ambulate: >150 feet;with rolling walker;with modified independence PT Goal: Ambulate - Progress: Progressing toward goal Pt will Perform Home Exercise Program: with supervision, verbal cues required/provided PT Goal: Perform Home Exercise Program - Progress: Progressing toward goal Additional Goals Additional Goal #1: Pt will ascend/descend 5 stairs with one rail, one crutch and Supervision PT Goal: Additional Goal #1 - Progress: Met  PT Treatment Precautions/Restrictions  Precautions Precautions: Knee Required Braces or Orthoses: Yes Knee Immobilizer: Discontinue once straight leg raise with < 10 degree lag Restrictions Weight Bearing Restrictions: No Mobility (including Balance) Bed Mobility Bed Mobility: Yes Supine to Sit: 4: Min assist Supine to Sit Details (indicate cue type and reason): assist for LLE Sit to Supine - Right: 4: Min assist;HOB flat Sit to Supine - Right Details (indicate cue type and reason): min A for LLE Transfers Transfers: Yes Sit to Stand: From elevated surface;With upper  extremity assist;6: Modified independent (Device/Increase time) Sit to Stand Details (indicate cue type and reason): VCs hand placement Stand to Sit: 6: Modified independent (Device/Increase time);With upper extremity assist;To bed Stand to Sit Details: VCs hand placement Ambulation/Gait Ambulation/Gait: Yes Ambulation/Gait Assistance: 6: Modified independent (Device/Increase time) Ambulation/Gait Assistance Details (indicate cue type and reason): VCs for flexed neck Ambulation Distance (Feet): 35 Feet Assistive device: Rolling walker Gait Pattern: Step-to pattern Stairs: Yes Stairs Assistance: 5: Supervision Stairs Assistance Details (indicate cue type and reason): VCs sequencing Stair Management Technique: One rail Right (with one crutch Left) Number of Stairs: 5     Exercise  Total Joint Exercises Ankle Circles/Pumps: AROM;15 reps;Supine Quad Sets: AROM;Left;10 reps;Seated Short Arc Quad: AAROM;Left;Strengthening;10 reps;Seated Heel Slides: Left;AAROM;10 reps;Seated Straight Leg Raises: AAROM;Left;10 reps;Seated End of Session PT - End of Session Equipment Utilized During Treatment: Gait belt;Left knee immobilizer Activity Tolerance: Patient limited by fatigue Patient left: in bed;with call bell in reach;with family/visitor present General Behavior During Session: [facility] for tasks performed Cognition: [facility] for tasks performed  [name] [name] PT 03/23/2011  [phone]  [name] [name] 03/23/2011, 2:15 PM

## 2011-03-23 NOTE — Progress Notes (Signed)
Physical Therapy Treatment Patient Details Name: [name] [name] MRN: [id number] DOB: 1940/06/13 Today's Date: 03/23/2011 11:35-12:05 G, TE  PT Assessment/Plan  PT - Assessment/Plan Comments on Treatment Session: Increased gait distance, progressing well PT Plan: Discharge plan remains appropriate PT Frequency: 7X/week Follow Up Recommendations: Home health PT;24 hour supervision/assistance Equipment Recommended: Rolling walker with 5" wheels;3 in 1 bedside comode PT Goals  Acute Rehab PT Goals PT Goal Formulation: With patient/family Time For Goal Achievement: 7 days Pt will go Supine/Side to Sit: with supervision PT Goal: Supine/Side to Sit - Progress: Other (comment) Pt will go Sit to Supine/Side: with supervision PT Goal: Sit to Supine/Side - Progress:  (not assessed) Pt will Transfer Sit to Stand/Stand to Sit: with modified independence PT Transfer Goal: Sit to Stand/Stand to Sit - Progress: Progressing toward goal Pt will Ambulate: >150 feet;with rolling walker;with modified independence PT Goal: Ambulate - Progress: Progressing toward goal Pt will Perform Home Exercise Program: with supervision, verbal cues required/provided PT Goal: Perform Home Exercise Program - Progress: Progressing toward goal  PT Treatment Precautions/Restrictions  Precautions Precautions: Knee Required Braces or Orthoses: Yes Knee Immobilizer: Discontinue once straight leg raise with < 10 degree lag Restrictions Weight Bearing Restrictions: No Mobility (including Balance) Bed Mobility Bed Mobility: No Transfers Transfers: Yes Sit to Stand: 5: Supervision;From elevated surface;With upper extremity assist Sit to Stand Details (indicate cue type and reason): VCs hand placement Stand to Sit: 5: Supervision;To chair/3-in-1 Stand to Sit Details: VCs hand placement Ambulation/Gait Ambulation/Gait: Yes Ambulation/Gait Assistance: 5: Supervision Ambulation/Gait Assistance Details (indicate cue  type and reason): VCs for flexed neck Ambulation Distance (Feet): 160 Feet Assistive device: Rolling walker Gait Pattern: Step-to pattern    Exercise  Total Joint Exercises Ankle Circles/Pumps: AROM;15 reps;Supine Quad Sets: AROM;Left;10 reps;Seated Short Arc Quad: AAROM;Left;Strengthening;10 reps;Seated Heel Slides: Left;AAROM;10 reps;Seated Straight Leg Raises: AAROM;Left;10 reps;Seated End of Session PT - End of Session Equipment Utilized During Treatment: Left knee immobilizer Activity Tolerance: Patient tolerated treatment well Patient left: with call bell in reach;with family/visitor present;in chair General Behavior During Session: [facility] for tasks performed Cognition: [facility] for tasks performed  Ralene Bathe Kistler PT 03/23/2011  [phone]   [name] 03/23/2011, 12:19 PM

## 2011-03-23 NOTE — Progress Notes (Signed)
Subjective: 2 Days Post-Op Procedure(s) (LRB): TOTAL KNEE ARTHROPLASTY (Left) Patient reports pain as mild.   Patient seen in rounds with Dr. Lequita Halt. Patient has complaints of itching with oxycodone  Objective: Vital signs in last 24 hours: Temp:  [98 F (36.7 C)-99.3 F (37.4 C)] 99.3 F (37.4 C) (11/14 0525) Pulse Rate:  [62-74] 74  (11/14 0525) Resp:  [14-18] 14  (11/14 0525) BP: (106-138)/(60-71) 138/71 mmHg (11/14 0525) SpO2:  [92 %-97 %] 94 % (11/14 0525)  Intake/Output from previous day:  Intake/Output Summary (Last 24 hours) at 03/23/11 0739 Last data filed at 03/23/11 0640  Gross per 24 hour  Intake    480 ml  Output   1375 ml  Net   -895 ml    Intake/Output this shift:     Basename 03/23/11 0436 03/22/11 0405  HGB 9.5* 10.2*    Basename 03/23/11 0436 03/22/11 0405  WBC 8.9 9.0  RBC 2.98* 3.25*  HCT 27.4* 30.1*  PLT 155 182    Basename 03/23/11 0436 03/22/11 0405  NA 132* 134*  K 4.1 4.7  CL 100 103  CO2 28 27  BUN 14 16  CREATININE 0.90 0.76  GLUCOSE 126* 140*  CALCIUM 8.8 8.5   No results found for this basename: LABPT:2,INR:2 in the last 72 hours  Exam - Neurovascular intact Incision: no drainage Compartment soft Dressing/Incision - clean, no drainage Motor function intact - moving foot and toes well on exam.   Assessment/Plan: 2 Days Post-Op Procedure(s) (LRB): TOTAL KNEE ARTHROPLASTY (Left)  Advance diet Up with therapy D/C IV fluids Plan for discharge tomorrow Change oxycodone to Norco   Past Medical History  Diagnosis Date  . Nausea   . Coronary artery disease   . Hypertension   . Sleep apnea     UNABLE TO ADJUST TO USING C-PAP  . Hypothyroidism   . Hematuria     OCASIONAL HEMATURIA UNK CAUSE  . Arthritis   . PONV (postoperative nausea and vomiting)     DVT Prophylaxis - Xarelto  Protocol Weight-Bearing as tolerated to left leg  Chaise Mahabir V 03/23/2011, 7:39 AM

## 2011-03-23 NOTE — Progress Notes (Signed)
CM consult done. See CM notes in shadow chart.  Lillyauna Jenkinson Wyche RN BSN CCM [phone] 03/23/2011    

## 2011-03-24 ENCOUNTER — Encounter (HOSPITAL_COMMUNITY): Payer: Self-pay | Admitting: Orthopedic Surgery

## 2011-03-24 LAB — CBC
HCT: 27.2 % — ABNORMAL LOW (ref 39.0–52.0)
RBC: 2.96 MIL/uL — ABNORMAL LOW (ref 4.22–5.81)
RDW: 12.3 % (ref 11.5–15.5)
WBC: 9.2 10*3/uL (ref 4.0–10.5)

## 2011-03-24 MED ORDER — RIVAROXABAN 10 MG PO TABS: 10.0000 mg | ORAL_TABLET | ORAL | Status: DC

## 2011-03-24 MED ORDER — HYDROCODONE-ACETAMINOPHEN 5-325 MG PO TABS: 1.0000 | ORAL_TABLET | ORAL | Status: AC | PRN

## 2011-03-24 MED ORDER — METHOCARBAMOL 500 MG PO TABS: 500.0000 mg | ORAL_TABLET | Freq: Four times a day (QID) | ORAL | Status: AC | PRN

## 2011-03-24 NOTE — Progress Notes (Signed)
Patient given discharge instructions and prescriptions. Verbalized understanding of instructions, medications reviewed. Iv site removed, catheter tip intact. No bleeding from site. Patient left unit in wheelchair accompanied by staff and family member

## 2011-03-24 NOTE — Discharge Summary (Signed)
Physician Discharge Summary   Patient ID: [name] [name] MRN: [id number] DOB/AGE: Jan 04, 1941 70 y.o.  Admit date: 03/21/2011 Discharge date: 03/24/2011  Primary Diagnosis: Osteoarthritis left knee   Admission Diagnoses: Past Medical History  Diagnosis Date  . Nausea   . Coronary artery disease   . Hypertension   . Sleep apnea     UNABLE TO ADJUST TO USING C-PAP  . Hypothyroidism   . Hematuria     OCASIONAL HEMATURIA UNK CAUSE  . Arthritis   . PONV (postoperative nausea and vomiting)     Discharge Diagnoses:  Principal Problem:  *Osteoarthritis of left knee Postop Hyponatremia  Postop Acute Blood Loss Anemia  Procedure: Procedure(s) (LRB): TOTAL KNEE ARTHROPLASTY (Left)   Consults: none  HPI:  [name] [name] is a 70 y.o. year old male with end stage OA of his left knee with progressively worsening pain and dysfunction. He has constant pain, with activity and at rest and significant functional deficits with difficulties even with ADLs. He has had extensive non-op management including analgesics, injections of cortisone and home exercise program, but remains in significant pain with significant dysfunction. Radiographs show bone on bone arthritis of the medial and patellofemoral compartments with significant spurs and significant varus deformity. He presents now for left Total Knee Arthroplasty.     Laboratory Data: Results for orders placed during the hospital encounter of 03/21/11  TYPE AND SCREEN      Component Value Range   ABO/RH(D) A POS     Antibody Screen NEG     Sample Expiration 03/24/2011    ABO/RH      Component Value Range   ABO/RH(D) A POS    CBC      Component Value Range   WBC 9.0  4.0 - 10.5 (K/uL)   RBC 3.25 (*) 4.22 - 5.81 (MIL/uL)   Hemoglobin 10.2 (*) 13.0 - 17.0 (g/dL)   HCT 40.9 (*) 81.1 - 52.0 (%)   MCV 92.6  78.0 - 100.0 (fL)   MCH 31.4  26.0 - 34.0 (pg)   MCHC 33.9  30.0 - 36.0 (g/dL)   RDW 91.4  78.2 - 95.6 (%)   Platelets 182   150 - 400 (K/uL)  BASIC METABOLIC PANEL      Component Value Range   Sodium 134 (*) 135 - 145 (mEq/L)   Potassium 4.7  3.5 - 5.1 (mEq/L)   Chloride 103  96 - 112 (mEq/L)   CO2 27  19 - 32 (mEq/L)   Glucose, Bld 140 (*) 70 - 99 (mg/dL)   BUN 16  6 - 23 (mg/dL)   Creatinine, Ser 2.13  0.50 - 1.35 (mg/dL)   Calcium 8.5  8.4 - 08.6 (mg/dL)   GFR calc non Af Amer >90  >90 (mL/min)   GFR calc Af Amer >90  >90 (mL/min)  CBC      Component Value Range   WBC 8.9  4.0 - 10.5 (K/uL)   RBC 2.98 (*) 4.22 - 5.81 (MIL/uL)   Hemoglobin 9.5 (*) 13.0 - 17.0 (g/dL)   HCT 57.8 (*) 46.9 - 52.0 (%)   MCV 91.9  78.0 - 100.0 (fL)   MCH 31.9  26.0 - 34.0 (pg)   MCHC 34.7  30.0 - 36.0 (g/dL)   RDW 62.9  52.8 - 41.3 (%)   Platelets 155  150 - 400 (K/uL)  BASIC METABOLIC PANEL      Component Value Range   Sodium 132 (*) 135 - 145 (mEq/L)  Potassium 4.1  3.5 - 5.1 (mEq/L)   Chloride 100  96 - 112 (mEq/L)   CO2 28  19 - 32 (mEq/L)   Glucose, Bld 126 (*) 70 - 99 (mg/dL)   BUN 14  6 - 23 (mg/dL)   Creatinine, Ser 2.13  0.50 - 1.35 (mg/dL)   Calcium 8.8  8.4 - 08.6 (mg/dL)   GFR calc non Af Amer 84 (*) >90 (mL/min)   GFR calc Af Amer >90  >90 (mL/min)  CBC      Component Value Range   WBC 9.2  4.0 - 10.5 (K/uL)   RBC 2.96 (*) 4.22 - 5.81 (MIL/uL)   Hemoglobin 9.3 (*) 13.0 - 17.0 (g/dL)   HCT 57.8 (*) 46.9 - 52.0 (%)   MCV 91.9  78.0 - 100.0 (fL)   MCH 31.4  26.0 - 34.0 (pg)   MCHC 34.2  30.0 - 36.0 (g/dL)   RDW 62.9  52.8 - 41.3 (%)   Platelets 173  150 - 400 (K/uL)    Office Visit on 03/14/2011  Component Date Value Range Status  . aPTT (seconds) 03/14/2011 35  24-37 Final  . WBC (K/uL) 03/14/2011 6.0  4.0-10.5 Final  . RBC (MIL/uL) 03/14/2011 4.21* 4.22-5.81 Final  . Hemoglobin (g/dL) 24/40/1027 25.3  66.4-40.3 Final  . HCT (%) 03/14/2011 38.2* 39.0-52.0 Final  . MCV (fL) 03/14/2011 90.7  78.0-100.0 Final  . MCH (pg) 03/14/2011 31.8  26.0-34.0 Final  . MCHC (g/dL) 47/42/5956 38.7   56.4-33.2 Final  . RDW (%) 03/14/2011 12.3  11.5-15.5 Final  . Platelets (K/uL) 03/14/2011 209  150-400 Final  . Sodium (mEq/L) 03/14/2011 136  135-145 Final  . Potassium (mEq/L) 03/14/2011 4.5  3.5-5.1 Final  . Chloride (mEq/L) 03/14/2011 101  96-112 Final  . CO2 (mEq/L) 03/14/2011 27  19-32 Final  . Glucose, Bld (mg/dL) 95/18/8416 90  60-63 Final  . BUN (mg/dL) 01/60/1093 27* 2-35 Final  . Creatinine, Ser (mg/dL) 57/32/2025 4.27  0.62-3.76 Final  . Calcium (mg/dL) 28/31/5176 16.0  7.3-71.0 Final  . Total Protein (g/dL) 62/69/4854 7.7  6.2-7.0 Final  . Albumin (g/dL) 35/00/9381 4.0  8.2-9.9 Final  . AST (U/L) 03/14/2011 23  0-37 Final  . ALT (U/L) 03/14/2011 26  0-53 Final  . Alkaline Phosphatase (U/L) 03/14/2011 55  39-117 Final  . Total Bilirubin (mg/dL) 37/16/9678 0.5  9.3-8.1 Final  . GFR calc non Af Amer (mL/min) 03/14/2011 85* >90 Final  . GFR calc Af Amer (mL/min) 03/14/2011 >90  >90 Final   Comment:                                 The eGFR has been calculated                          using the CKD EPI equation.                          This calculation has not been                          validated in all clinical                          situations.  eGFR's persistently                          <90 mL/min signify                          possible Chronic Kidney Disease.  [name] Prothrombin Time (seconds) 03/14/2011 14.0  11.6-15.2 Final  . INR  03/14/2011 1.06  0.00-1.49 Final  . MRSA, PCR  03/14/2011 NEGATIVE  NEGATIVE Final  . Staphylococcus aureus  03/14/2011 NEGATIVE  NEGATIVE Final   Comment:                                 The Xpert SA Assay (FDA                          approved for NASAL specimens                          only), is one component of                          a comprehensive surveillance                          program.  It is not intended                          to diagnose infection nor to                          guide or  monitor treatment.  . Color, Urine  03/14/2011 YELLOW  YELLOW Final  . Appearance  03/14/2011 CLEAR  CLEAR Final  . Specific Gravity, Urine  03/14/2011 1.025  1.005-1.030 Final  . pH  03/14/2011 6.0  5.0-8.0 Final  . Glucose, UA (mg/dL) 96/08/5407 NEGATIVE  NEGATIVE Final  . Hgb urine dipstick  03/14/2011 NEGATIVE  NEGATIVE Final  . Bilirubin Urine  03/14/2011 NEGATIVE  NEGATIVE Final  . Ketones, ur (mg/dL) 81/19/1478 NEGATIVE  NEGATIVE Final  . Protein, ur (mg/dL) 29/56/2130 NEGATIVE  NEGATIVE Final  . Urobilinogen, UA (mg/dL) 86/57/8469 0.2  6.2-9.5 Final  . Nitrite  03/14/2011 NEGATIVE  NEGATIVE Final  . Leukocytes, UA  03/14/2011 NEGATIVE  NEGATIVE Final   MICROSCOPIC NOT DONE ON URINES WITH NEGATIVE PROTEIN, BLOOD, LEUKOCYTES, NITRITE, OR GLUCOSE <1000 mg/dL.    X-Rays:Dg Chest 2 View  03/14/2011  *RADIOLOGY REPORT*  Clinical Data: Preop knee replacement  CHEST - 2 VIEW  Comparison: Chest radiograph 03/11/2010  Findings: Sternotomy wires overlie normal cardiac silhouette. There is scarring at the left lung base which is similar to prior. No effusion, infiltrate, or pneumothorax. Cholecystectomy clips.  IMPRESSION:  1.  No acute cardiopulmonary process. 2. Post CABG and cholecystectomy  Original Report Authenticated By: Genevive Bi, M.D.    EKG:No orders found for this or any previous visit.   Hospital Course: Patient was admitted to [facility] and taken to the OR and underwent the above state procedure without complications.  Patient tolerated the procedure well and was later transferred to the recovery room and then to the orthopaedic floor for postoperative care.  They were given PO and IV analgesics for pain control  following their surgery.  They were given 24 hours of postoperative antibiotics and started on DVT prophylaxis.   PT and OT were ordered for total joint protocol.  Discharge planning consulted to help with postop disposition and equipment needs.  Patient had a  decent night on the evening of surgery and started to get up with therapy on day one.  PCA was discontinued and they were weaned over to PO meds.  Hemovac drain was pulled without difficulty.  Continued to progress with therapy into day two and walking over 30 feet.  Dressing was changed on day two and the incision was healing well.  By day three, the patient had progressed with therapy and meeting goals and walking190 Feet.  Incision was healing well.  Patient was seen in rounds and was ready to go home.    Discharge Medications: Hydrocodone, Robaxin, Xarelto  Diet:general and low sodium  Activity:WBAT   Follow-up:in 2 weeks  Disposition: Home  Discharged Condition: good   Discharge Orders    Future Orders Please Complete By Expires   Diet - low sodium heart healthy      Call MD / Call 911      Comments:   If you experience chest pain or shortness of breath, CALL 911 and be transported to the hospital emergency room.  If you develope a fever above 101 F, pus (white drainage) or increased drainage or redness at the wound, or calf pain, call your surgeon's office.   Constipation Prevention      Comments:   Drink plenty of fluids.  Prune juice may be helpful.  You may use a stool softener, such as Colace (over the counter) 100 mg twice a day.  Use MiraLax (over the counter) for constipation as needed.   Increase activity slowly as tolerated      Weight Bearing as taught in Physical Therapy      Comments:   Use a walker or crutches as instructed.   Discharge instructions      Comments:   Pick up stool softner and laxative for home. Do not submerge incision under water. May shower.    Driving restrictions      Comments:   No driving   Lifting restrictions      Comments:   No lifting   TED hose      Comments:   Use stockings (TED hose) for 2 weeks on both leg(s).  You may remove them at night for sleeping.   Change dressing      Comments:   Change dressing daily with  sterile 4 x 4 inch gauze dressing and apply TED hose.     Current Discharge Medication List    START taking these medications   Details  HYDROcodone-acetaminophen (NORCO) 5-325 MG per tablet Take 1-2 tablets by mouth every 4 (four) hours as needed for pain. Qty: 80 tablet, Refills: 0    methocarbamol (ROBAXIN) 500 MG tablet Take 1 tablet (500 mg total) by mouth every 6 (six) hours as needed. Qty: 80 tablet, Refills: 0    rivaroxaban (XARELTO) 10 MG TABS tablet Take 1 tablet (10 mg total) by mouth daily. Qty: 18 tablet, Refills: 0      CONTINUE these medications which have NOT CHANGED   Details  atorvastatin (LIPITOR) 20 MG tablet Take 20 mg by mouth at bedtime.      enalapril (VASOTEC) 20 MG tablet Take 20 mg by mouth at bedtime.      levothyroxine (SYNTHROID, LEVOTHROID) 112  MCG tablet Take 112 mcg by mouth every morning.      metoprolol succinate (TOPROL-XL) 25 MG 24 hr tablet Take 25 mg by mouth every morning.        STOP taking these medications     aspirin 81 MG chewable tablet      cholecalciferol (VITAMIN D) 1000 UNITS tablet      Coenzyme Q10 (COQ-10 PO)      Multiple Vitamins-Minerals (MULTIVITAMINS THER. W/MINERALS) TABS      Omega-3 Fatty Acids (OMEGA-3 FISH OIL) 1200 MG CAPS        Follow-up Information    Follow up with ALUISIO,FRANK V. Make an appointment on 04/05/2011.   Contact information:   [facility] [address], [address] [place] [address] [phone]          Signed: Patrica Duel 03/24/2011, 10:41 AM

## 2011-03-24 NOTE — Progress Notes (Signed)
Physical Therapy Treatment Patient Details Name: [name] [name] MRN: [id number] DOB: 1940/05/18 Today's Date: 03/24/2011 9:40-10:05, Elouise Munroe PT Assessment/Plan  PT - Assessment/Plan Comments on Treatment Session: Progressing well PT Plan: Discharge plan remains appropriate PT Frequency: 7X/week Follow Up Recommendations: Home health PT;24 hour supervision/assistance Equipment Recommended: 3 in 1 bedside comode;Rolling walker with 5" wheels (Have been issued) PT Goals  Acute Rehab PT Goals PT Goal Formulation: With patient/family Time For Goal Achievement: 7 days Pt will go Supine/Side to Sit: with supervision PT Goal: Supine/Side to Sit - Progress: Progressing toward goal Pt will go Sit to Supine/Side: with supervision PT Goal: Sit to Supine/Side - Progress: Progressing toward goal Pt will Transfer Sit to Stand/Stand to Sit: with modified independence PT Transfer Goal: Sit to Stand/Stand to Sit - Progress: Progressing toward goal Pt will Ambulate: >150 feet;with rolling walker;with modified independence PT Goal: Ambulate - Progress: Progressing toward goal Pt will Perform Home Exercise Program: with supervision, verbal cues required/provided PT Goal: Perform Home Exercise Program - Progress: Met Additional Goals Additional Goal #1: Pt will ascend/descend 5 stairs with one rail, one crutch and Supervision PT Goal: Additional Goal #1 - Progress: Met  PT Treatment Precautions/Restrictions  Precautions Precautions: Knee Required Braces or Orthoses: Yes Knee Immobilizer: Discontinue once straight leg raise with < 10 degree lag Restrictions Weight Bearing Restrictions: No Mobility (including Balance) Transfers Transfers: Yes Sit to Stand: 5: Supervision Stand to Sit: 6: Modified independent (Device/Increase time);With upper extremity assist;To bed Ambulation/Gait Ambulation/Gait: Yes Ambulation/Gait Assistance: 5: Supervision Ambulation/Gait Assistance Details (indicate cue  type and reason): cues for step length and RW placement Ambulation Distance (Feet): 190 Feet Assistive device: Rolling walker Gait Pattern: Step-to pattern    Exercise  Total Joint Exercises Ankle Circles/Pumps: AROM;Both;15 reps Quad Sets: AROM;Strengthening;Left;10 reps Heel Slides: AAROM;Strengthening;Left;10 reps Straight Leg Raises: AAROM;Strengthening;Left;10 reps End of Session PT - End of Session Equipment Utilized During Treatment: Left knee immobilizer Activity Tolerance: Patient tolerated treatment well Patient left: in bed;with call bell in reach Nurse Communication: Mobility status for transfers;Mobility status for ambulation General Behavior During Session: [facility] for tasks performed Cognition: [facility] for tasks performed  [facility] LUBECK 03/24/2011, 10:23 AM

## 2011-04-11 ENCOUNTER — Ambulatory Visit: Payer: Medicare Other | Attending: Orthopedic Surgery | Admitting: Physical Therapy

## 2011-04-11 DIAGNOSIS — Z96659 Presence of unspecified artificial knee joint: Secondary | ICD-10-CM | POA: Insufficient documentation

## 2011-04-11 DIAGNOSIS — M25569 Pain in unspecified knee: Secondary | ICD-10-CM | POA: Insufficient documentation

## 2011-04-11 DIAGNOSIS — M25669 Stiffness of unspecified knee, not elsewhere classified: Secondary | ICD-10-CM | POA: Insufficient documentation

## 2011-04-11 DIAGNOSIS — IMO0001 Reserved for inherently not codable concepts without codable children: Secondary | ICD-10-CM | POA: Insufficient documentation

## 2011-04-12 ENCOUNTER — Ambulatory Visit: Payer: Medicare Other | Admitting: Physical Therapy

## 2011-04-13 ENCOUNTER — Ambulatory Visit: Payer: Medicare Other | Admitting: Physical Therapy

## 2011-04-15 ENCOUNTER — Ambulatory Visit: Payer: Medicare Other | Admitting: Physical Therapy

## 2011-04-18 ENCOUNTER — Ambulatory Visit: Payer: Medicare Other

## 2011-04-20 ENCOUNTER — Ambulatory Visit: Payer: Medicare Other | Admitting: Physical Therapy

## 2011-04-22 ENCOUNTER — Ambulatory Visit: Payer: Medicare Other | Admitting: Physical Therapy

## 2011-04-25 ENCOUNTER — Ambulatory Visit: Payer: Medicare Other

## 2011-04-27 ENCOUNTER — Ambulatory Visit: Payer: Medicare Other | Admitting: Physical Therapy

## 2011-04-29 ENCOUNTER — Ambulatory Visit: Payer: Medicare Other | Admitting: Physical Therapy

## 2011-04-29 ENCOUNTER — Ambulatory Visit (INDEPENDENT_AMBULATORY_CARE_PROVIDER_SITE_OTHER): Payer: Medicare Other

## 2011-04-29 DIAGNOSIS — Z23 Encounter for immunization: Secondary | ICD-10-CM

## 2011-05-04 ENCOUNTER — Ambulatory Visit: Payer: Medicare Other | Admitting: Physical Therapy

## 2011-05-06 ENCOUNTER — Ambulatory Visit: Payer: Medicare Other | Admitting: Physical Therapy

## 2011-05-11 ENCOUNTER — Ambulatory Visit: Payer: Medicare Other | Attending: Orthopedic Surgery

## 2011-05-11 DIAGNOSIS — M25669 Stiffness of unspecified knee, not elsewhere classified: Secondary | ICD-10-CM | POA: Insufficient documentation

## 2011-05-11 DIAGNOSIS — M25569 Pain in unspecified knee: Secondary | ICD-10-CM | POA: Insufficient documentation

## 2011-05-11 DIAGNOSIS — IMO0001 Reserved for inherently not codable concepts without codable children: Secondary | ICD-10-CM | POA: Insufficient documentation

## 2011-05-11 DIAGNOSIS — Z96659 Presence of unspecified artificial knee joint: Secondary | ICD-10-CM | POA: Insufficient documentation

## 2011-05-13 ENCOUNTER — Ambulatory Visit: Payer: Medicare Other

## 2011-05-18 ENCOUNTER — Ambulatory Visit: Payer: Medicare Other

## 2011-05-20 ENCOUNTER — Ambulatory Visit: Payer: Medicare Other | Admitting: Physical Therapy

## 2011-05-24 ENCOUNTER — Ambulatory Visit: Payer: Medicare Other | Admitting: Physical Therapy

## 2011-05-26 ENCOUNTER — Encounter: Payer: Medicare Other | Admitting: Physical Therapy

## 2011-05-26 ENCOUNTER — Ambulatory Visit: Payer: Medicare Other

## 2012-04-27 ENCOUNTER — Ambulatory Visit (INDEPENDENT_AMBULATORY_CARE_PROVIDER_SITE_OTHER): Payer: Medicare Other | Admitting: Family Medicine

## 2012-04-27 DIAGNOSIS — Z23 Encounter for immunization: Secondary | ICD-10-CM

## 2012-04-30 ENCOUNTER — Other Ambulatory Visit: Payer: Self-pay | Admitting: Orthopedic Surgery

## 2012-04-30 MED ORDER — DEXAMETHASONE SODIUM PHOSPHATE 10 MG/ML IJ SOLN: 10.0000 mg | Freq: Once | INTRAMUSCULAR | Status: DC

## 2012-04-30 MED ORDER — BUPIVACAINE LIPOSOME 1.3 % IJ SUSP: 20.0000 mL | Freq: Once | INTRAMUSCULAR | Status: DC

## 2012-04-30 NOTE — Progress Notes (Signed)
Preoperative surgical orders have been place into the Epic hospital system for Rodman Key on 04/30/2012, 1:13 PM  by Patrica Duel for surgery on 06/01/2012.  Preop Total Knee orders including Experal, IV Tylenol, and IV Decadron as long as there are no contraindications to the above medications. Avel Peace, PA-C

## 2012-05-21 ENCOUNTER — Encounter (HOSPITAL_COMMUNITY): Payer: Self-pay | Admitting: Pharmacy Technician

## 2012-05-24 ENCOUNTER — Encounter (HOSPITAL_COMMUNITY): Payer: Self-pay

## 2012-05-24 ENCOUNTER — Encounter (HOSPITAL_COMMUNITY)
Admission: RE | Admit: 2012-05-24 | Discharge: 2012-05-24 | Disposition: A | Discharge status: Home / Self Care | Payer: Medicare Other | Source: Ambulatory Visit | Attending: Orthopedic Surgery | Admitting: Orthopedic Surgery

## 2012-05-24 ENCOUNTER — Ambulatory Visit (HOSPITAL_COMMUNITY)
Admission: RE | Admit: 2012-05-24 | Discharge: 2012-05-24 | Disposition: A | Discharge status: Home / Self Care | Payer: Medicare Other | Source: Ambulatory Visit | Attending: Orthopedic Surgery | Admitting: Orthopedic Surgery

## 2012-05-24 DIAGNOSIS — Z01810 Encounter for preprocedural cardiovascular examination: Secondary | ICD-10-CM | POA: Insufficient documentation

## 2012-05-24 DIAGNOSIS — J984 Other disorders of lung: Secondary | ICD-10-CM | POA: Insufficient documentation

## 2012-05-24 DIAGNOSIS — Z01812 Encounter for preprocedural laboratory examination: Secondary | ICD-10-CM | POA: Insufficient documentation

## 2012-05-24 DIAGNOSIS — M1710 Unilateral primary osteoarthritis, unspecified knee: Secondary | ICD-10-CM | POA: Insufficient documentation

## 2012-05-24 LAB — SURGICAL PCR SCREEN: Staphylococcus aureus: NEGATIVE

## 2012-05-24 LAB — URINALYSIS, ROUTINE W REFLEX MICROSCOPIC
Bilirubin Urine: NEGATIVE
Glucose, UA: NEGATIVE mg/dL
Hgb urine dipstick: NEGATIVE
Ketones, ur: NEGATIVE mg/dL
Protein, ur: NEGATIVE mg/dL

## 2012-05-24 LAB — COMPREHENSIVE METABOLIC PANEL
ALT: 30 U/L (ref 0–53)
CO2: 28 mEq/L (ref 19–32)
Calcium: 10.1 mg/dL (ref 8.4–10.5)
Chloride: 102 mEq/L (ref 96–112)
GFR calc Af Amer: >90 mL/min (ref >90)
GFR calc non Af Amer: 84 mL/min — ABNORMAL LOW (ref >90)
Glucose, Bld: 103 mg/dL — ABNORMAL HIGH (ref 70–99)
Sodium: 138 mEq/L (ref 135–145)
Total Bilirubin: 0.4 mg/dL (ref 0.3–1.2)

## 2012-05-24 LAB — CBC
Hemoglobin: 14 g/dL (ref 13.0–17.0)
MCH: 30.9 pg (ref 26.0–34.0)
MCV: 89.2 fL (ref 78.0–100.0)
RBC: 4.53 MIL/uL (ref 4.22–5.81)
WBC: 6.1 10*3/uL (ref 4.0–10.5)

## 2012-05-24 NOTE — Pre-Procedure Instructions (Signed)
CBC, CMET, PT, PTT, UA, CXR WERE DONE TODAY -PREOP AT [facility] AS PER ORDERS DR. Lequita Halt AND ANESTHESIOLOGIST'S GUIDELINES.  PT HAS EKG REPORT AND CARDIOLOGY OFFICE NOTE ON CHART 12/21/11 DR. A. LITTLE.  PT'S HEART RATE IRREGULAR TODAY-EKG REPEATED-FREQ PVC'S-BIGEMINY.  TODAY'S EKG AND CARDIOLOGIST OFFICE NOTE AND PREVIOUS EKG 12/21/11 SHOWN TO DR. ROSE--DR. ROSE SAID EKG OK FOR SURGERY-BUT TO FAX THE EKG TO PT'S CARDIOLOGIST.  EKG WAS FAXED TO DR. HARDING AT [facility]--DR. A. LITTLE HAS RETIRED- DR. HARDING WILL BE FOLLOWING PT'S CARDIAC CARE--AND HAD ALREADY SENT A NOTE OF CARDIAC CLEARANCE TO DR. Lequita Halt FOR PT'S KNEE SURGERY -THAT NOTE IS ON CHART.

## 2012-05-24 NOTE — Patient Instructions (Signed)
YOUR SURGERY IS SCHEDULED AT [facility]  ON:  Friday 1/24  REPORT TO [facility] AT:  5:15 AM      PHONE # FOR SHORT STAY IS [phone]  DO NOT EAT OR DRINK ANYTHING AFTER MIDNIGHT THE NIGHT BEFORE YOUR SURGERY.  YOU MAY BRUSH YOUR TEETH, RINSE OUT YOUR MOUTH--BUT NO WATER, NO FOOD, NO CHEWING GUM, NO MINTS, NO CANDIES, NO CHEWING TOBACCO.  PLEASE TAKE THE FOLLOWING MEDICATIONS THE AM OF YOUR SURGERY WITH A FEW SIPS OF WATER:  LEVOTHYROXINE AND METOPROLOL  IF YOU USE INHALERS--USE YOUR INHALERS THE AM OF YOUR SURGERY AND BRING INHALERS TO THE HOSPITAL -TAKE TO SURGERY.    IF YOU ARE DIABETIC:  DO NOT TAKE ANY DIABETIC MEDICATIONS THE AM OF YOUR SURGERY.  IF YOU TAKE INSULIN IN THE EVENINGS--PLEASE ONLY TAKE 1/2 NORMAL EVENING DOSE THE NIGHT BEFORE YOUR SURGERY.  NO INSULIN THE AM OF YOUR SURGERY.  IF YOU HAVE SLEEP APNEA AND USE CPAP OR BIPAP--PLEASE BRING THE MASK AND THE TUBING.  DO NOT BRING YOUR MACHINE.  DO NOT BRING VALUABLES, MONEY, CREDIT CARDS.  DO NOT WEAR JEWELRY, MAKE-UP, NAIL POLISH AND NO METAL PINS OR CLIPS IN YOUR HAIR. CONTACT LENS, DENTURES / PARTIALS, GLASSES SHOULD NOT BE WORN TO SURGERY AND IN MOST CASES-HEARING AIDS WILL NEED TO BE REMOVED.  BRING YOUR GLASSES CASE, ANY EQUIPMENT NEEDED FOR YOUR CONTACT LENS. FOR PATIENTS ADMITTED TO THE HOSPITAL--CHECK OUT TIME THE DAY OF DISCHARGE IS 11:00 AM.  ALL INPATIENT ROOMS ARE PRIVATE - WITH BATHROOM, TELEPHONE, TELEVISION AND WIFI INTERNET.  IF YOU ARE BEING DISCHARGED THE SAME DAY OF YOUR SURGERY--YOU CAN NOT DRIVE YOURSELF HOME--AND SHOULD NOT GO HOME ALONE BY TAXI OR BUS.  NO DRIVING OR OPERATING MACHINERY FOR 24 HOURS FOLLOWING ANESTHESIA / PAIN MEDICATIONS.  PLEASE MAKE ARRANGEMENTS FOR SOMEONE TO BE WITH YOU AT HOME THE FIRST 24 HOURS AFTER SURGERY. RESPONSIBLE DRIVER'S NAME___________________________                                               PHONE #   _______________________                       PLEASE READ OVER ANY  FACT SHEETS THAT YOU WERE GIVEN: MRSA INFORMATION, BLOOD TRANSFUSION INFORMATION, INCENTIVE SPIROMETER INFORMATION. FAILURE TO FOLLOW THESE INSTRUCTIONS MAY RESULT IN THE CANCELLATION OF YOUR SURGERY.   PATIENT SIGNATURE_________________________________

## 2012-05-25 NOTE — Pre-Procedure Instructions (Signed)
SPOKE BY PHONE WITH DR. HARDING'S NURSE TO ASK IF EKG REPORT RECEIVED--SHE STATES IT MAY HAVE BEEN RECEIVED IN MEDICAL RECORDS-BUT NOT MADE IT'S WAY TO DR. HARDING--I REFAXED EKG TO DR. HARDING'S NURSE AT THE FAX NUMBER SHE GAVE ME.

## 2012-05-31 ENCOUNTER — Other Ambulatory Visit: Payer: Self-pay | Admitting: Orthopedic Surgery

## 2012-05-31 NOTE — H&P (Signed)
[name] [name]  DOB: [birth year] Married / Language: English / Race: White Male  Date of Admission:  06/01/2012  Chief Complaint:  Right Knee Pain  History of Present Illness The patient is a 72 year old male who comes in for a preoperative History and Physical. The patient is scheduled for a right total knee arthroplasty to be performed by Dr. Gus Rankin. Aluisio, MD at [facility] on 06/01/2012. The patient is being followed for their right knee pain and osteoarthritis. He states the right knee has been bothering him moreso lately. He says of course, it is better today. The right knee is starting to bother him as bad as the left knee did in the past. He is having pain that is limiting his activities. He said with regards to the left knee he has done great but feels like the right knee is holding him back now. Injections never helped with the left knee and have not helped with the right knee. He is at a stage where he is ready to get the knee fixed. They have been treated conservatively in the past for the above stated problem and despite conservative measures, they continue to have progressive pain and severe functional limitations and dysfunction. They have failed non-operative management including home exercise, medications, and injections. It is felt that they would benefit from undergoing total joint replacement. Risks and benefits of the procedure have been discussed with the patient and they elect to proceed with surgery. There are no active contraindications to surgery such as ongoing infection or rapidly progressive neurological disease.   Problem List Primary osteoarthritis of one knee (715.16) S/P Left total knee arthroplasty (V43.65)   Allergies Iodinated Contrast. "wired" Sulfanomides Doxy *TETRACYCLINES*. hematuria   Family History Father. Deceased, Heart disease, Myocardial infarction, Alzheimer's disease. 20 Mother. Deceased, Heart disease,  Myocardial infarction. 67 Heart Disease. mother and father Cancer. mother and sister   Social History Tobacco use. Former smoker. No alcohol use Post-Surgical Plans. Plan for home Alcohol use. former drinker Children. 1. 1 Number of flights of stairs before winded. 2-3 Marital status. Married. 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 Former smoker. former smoker Tobacco / smoke exposure. no Living situation. Lives with spouse. live with spouse Drug/Alcohol Rehab (Currently). 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 no Exercise. Exercises weekly; does gym / weights   Medication History Enalapril Maleate ( Oral) Specific dose unknown - Active. Synthroid ( Oral) Specific dose unknown - Active. Metoprolol Succinate ( Oral) Specific dose unknown - Active. Lipitor ( Oral) Specific dose unknown - Active. Aspirin EC Low Strength ( Oral) Specific dose unknown - Active. Multivitamins ( Oral) Specific dose unknown - Active. Fish Oil Burp-Less ( Oral) Specific dose unknown - Active. Co Q-10 (300MG  Capsule, Oral) Active.   Past Surgical History Cholecystectomy. Date: 01/1996. Coronary Artery Bypass, Three. 2002 Gallbladder Surgery. laporoscopic Coronary Artery Bypass Graft. Date: 05/1999. 3 vessels Total Knee Replacement - Left. Date: 03/21/2011.   Medical History Measles. Childhood Mumps. Childhood Sleep Apnea. does not use CPAP History of Pneumonia Coronary Artery Disease/Heart Disease Osteoarthritis Hypothyroidism. secondary to treatment for hyperthyroidism Coronary Artery Bypass Graft Hypercholesterolemia Hypertension Gallbladder Problems Coronary artery disease   Review of Systems General:Not Present- Chills, Fever, Night Sweats, Fatigue, Weight Gain, Weight Loss and Memory Loss. Skin:Not Present- Hives, Itching, Rash, Eczema and Lesions. HEENT:Not Present- Tinnitus, Headache,  Double Vision, Visual Loss, Hearing Loss and Dentures. Respiratory:Not Present- Shortness of breath with exertion, Shortness  of breath at rest, Allergies, Coughing up blood and Chronic Cough. Cardiovascular:Not Present- Chest Pain, Racing/skipping heartbeats, Difficulty Breathing Lying Down, Murmur, Swelling and Palpitations. Gastrointestinal:Not Present- Bloody Stool, Heartburn, Abdominal Pain, Vomiting, Nausea, Constipation, Diarrhea, Difficulty Swallowing, Jaundice and Loss of appetitie. Male Genitourinary:Not Present- Urinary frequency, Blood in Urine, Weak urinary stream, Discharge, Flank Pain, Incontinence, Painful Urination, Urgency, Urinary Retention and Urinating at Night. Musculoskeletal:Not Present- Muscle Weakness, Muscle Pain, Joint Swelling, Joint Pain, Back Pain, Morning Stiffness and Spasms. Neurological:Not Present- Tremor, Dizziness, Blackout spells, Paralysis, Difficulty with balance and Weakness. Psychiatric:Not Present- Insomnia.   Vitals Weight: 176 lb Height: 68 in Weight was reported by patient. Height was reported by patient. Body Surface Area: 1.96 m Body Mass Index: 26.76 kg/m Pulse: 72 (Irregular) Resp.: 12 (Unlabored) BP: 144/82 (Sitting, Left Arm, Standard) Pulse rate is regular except for an occassional skipped or dropped beat on palpation.   Physical Exam The physical exam findings are as follows:  Note: Patient is a 72 year old male with continued right knee pain.   General Mental Status - Alert, cooperative and good historian. General Appearance- pleasant. Not in acute distress. Orientation- Oriented X3. Build & Nutrition- Well nourished and Well developed.   Head and Neck Head- normocephalic, atraumatic . Neck Global Assessment- supple. no bruit auscultated on the right and no bruit auscultated on the left.   Eye Vision- Wears corrective lenses. Pupil- Bilateral- Regular and Round. Motion- Bilateral-  EOMI.   Chest and Lung Exam Auscultation: Breath sounds:- clear at anterior chest wall and - clear at posterior chest wall. Adventitious sounds:- No Adventitious sounds.   Cardiovascular Auscultation:Rhythm- Regular rate and rhythm. Heart Sounds- S1 WNL and S2 WNL. Murmurs & Other Heart Sounds:Auscultation of the heart reveals - No Murmurs.   Abdomen Palpation/Percussion:Tenderness- Abdomen is non-tender to palpation. Rigidity (guarding)- Abdomen is soft. Auscultation:Auscultation of the abdomen reveals - Bowel sounds normal.   Male Genitourinary  Not done, not pertinent to present illness  Musculoskeletal  Well developed male alert and oriented in no apparent distress. Left knee looks fantastic. Absolutely no swelling. Range of motion 0 to 125 degrees. There is no instability. The right knee slight varus. Range 5 to 120. Tenderness medial greater than lateral with no instability noted. Pulses, sensation and motor are intact both lower extremities.  RADIOGRAPHS: AP both knees and lateral show the prosthesis on the left is in excellent position with no periprosthetic abnormalities. On the right he has bone on bone arthritis in the medial and patellofemoral compartments.  Assessment & Plan Primary osteoarthritis of one knee (715.16) Impression: Right Knee  Note: Plan is for a Right Total Knee Replacement by Dr. Lequita Halt.  Plan is to go to home.  PCP - Dr. Lupita Raider Cardiology - Dr. Herbie Baltimore - Patient has been seen preoperatively and felt to be stable for surgery. Urology - Dr. Isabel Caprice  Signed electronically by Roberts Gaudy, PA-C

## 2012-06-01 ENCOUNTER — Inpatient Hospital Stay (HOSPITAL_COMMUNITY): Payer: Medicare Other | Admitting: Registered Nurse

## 2012-06-01 ENCOUNTER — Encounter (HOSPITAL_COMMUNITY): Payer: Self-pay | Admitting: *Deleted

## 2012-06-01 ENCOUNTER — Inpatient Hospital Stay (HOSPITAL_COMMUNITY)
Admission: RE | Admit: 2012-06-01 | Discharge: 2012-06-03 | DRG: 470 | Disposition: A | Discharge status: Home Health | Payer: Medicare Other | Source: Ambulatory Visit | Attending: Orthopedic Surgery | Admitting: Orthopedic Surgery

## 2012-06-01 ENCOUNTER — Encounter (HOSPITAL_COMMUNITY): Payer: Self-pay | Admitting: Registered Nurse

## 2012-06-01 ENCOUNTER — Encounter (HOSPITAL_COMMUNITY)
Admission: RE | Disposition: A | Discharge status: Home Health | Payer: Self-pay | Source: Ambulatory Visit | Attending: Orthopedic Surgery

## 2012-06-01 DIAGNOSIS — Z87891 Personal history of nicotine dependence: Secondary | ICD-10-CM

## 2012-06-01 DIAGNOSIS — G4730 Sleep apnea, unspecified: Secondary | ICD-10-CM | POA: Diagnosis present

## 2012-06-01 DIAGNOSIS — E7800 Pure hypercholesterolemia, unspecified: Secondary | ICD-10-CM | POA: Diagnosis present

## 2012-06-01 DIAGNOSIS — I2510 Atherosclerotic heart disease of native coronary artery without angina pectoris: Secondary | ICD-10-CM | POA: Diagnosis present

## 2012-06-01 DIAGNOSIS — Z951 Presence of aortocoronary bypass graft: Secondary | ICD-10-CM

## 2012-06-01 DIAGNOSIS — Z9089 Acquired absence of other organs: Secondary | ICD-10-CM

## 2012-06-01 DIAGNOSIS — M1710 Unilateral primary osteoarthritis, unspecified knee: Principal | ICD-10-CM | POA: Diagnosis present

## 2012-06-01 DIAGNOSIS — Y92009 Unspecified place in unspecified non-institutional (private) residence as the place of occurrence of the external cause: Secondary | ICD-10-CM

## 2012-06-01 DIAGNOSIS — Z882 Allergy status to sulfonamides status: Secondary | ICD-10-CM

## 2012-06-01 DIAGNOSIS — Z79899 Other long term (current) drug therapy: Secondary | ICD-10-CM

## 2012-06-01 DIAGNOSIS — Z8249 Family history of ischemic heart disease and other diseases of the circulatory system: Secondary | ICD-10-CM

## 2012-06-01 DIAGNOSIS — E032 Hypothyroidism due to medicaments and other exogenous substances: Secondary | ICD-10-CM | POA: Diagnosis present

## 2012-06-01 DIAGNOSIS — I10 Essential (primary) hypertension: Secondary | ICD-10-CM | POA: Diagnosis present

## 2012-06-01 DIAGNOSIS — M179 Osteoarthritis of knee, unspecified: Secondary | ICD-10-CM | POA: Diagnosis present

## 2012-06-01 DIAGNOSIS — Z888 Allergy status to other drugs, medicaments and biological substances status: Secondary | ICD-10-CM

## 2012-06-01 DIAGNOSIS — Z96659 Presence of unspecified artificial knee joint: Secondary | ICD-10-CM

## 2012-06-01 DIAGNOSIS — Z7982 Long term (current) use of aspirin: Secondary | ICD-10-CM

## 2012-06-01 HISTORY — PX: TOTAL KNEE ARTHROPLASTY: SHX125

## 2012-06-01 LAB — TYPE AND SCREEN

## 2012-06-01 SURGERY — ARTHROPLASTY, KNEE, TOTAL
Anesthesia: Spinal | Site: Knee | Laterality: Right | Wound class: Clean

## 2012-06-01 MED ORDER — METHOCARBAMOL 500 MG PO TABS
500.0000 mg | ORAL_TABLET | Freq: Four times a day (QID) | ORAL | Status: DC | PRN
  Administered 2012-06-02 (×2): 500 mg via ORAL
  Filled 2012-06-01 (×2): qty 1

## 2012-06-01 MED ORDER — ACETAMINOPHEN 650 MG RE SUPP: 650.0000 mg | Freq: Four times a day (QID) | RECTAL | Status: DC | PRN

## 2012-06-01 MED ORDER — FLEET ENEMA 7-19 GM/118ML RE ENEM: 1.0000 | ENEMA | Freq: Once | RECTAL | Status: AC | PRN

## 2012-06-01 MED ORDER — ATORVASTATIN CALCIUM 20 MG PO TABS
20.0000 mg | ORAL_TABLET | Freq: Every day | ORAL | Status: DC
  Administered 2012-06-02: 20 mg via ORAL
  Filled 2012-06-01 (×3): qty 1

## 2012-06-01 MED ORDER — ONDANSETRON HCL 4 MG/2ML IJ SOLN
4.0000 mg | Freq: Four times a day (QID) | INTRAMUSCULAR | Status: DC | PRN
  Administered 2012-06-01: 4 mg via INTRAVENOUS
  Filled 2012-06-01: qty 2

## 2012-06-01 MED ORDER — CEFAZOLIN SODIUM 1-5 GM-% IV SOLN
1.0000 g | Freq: Four times a day (QID) | INTRAVENOUS | Status: AC
Start: 2012-06-01 — End: 2012-06-01
  Administered 2012-06-01 (×2): 1 g via INTRAVENOUS
  Filled 2012-06-01 (×2): qty 50

## 2012-06-01 MED ORDER — DIPHENHYDRAMINE HCL 12.5 MG/5ML PO ELIX: 12.5000 mg | ORAL_SOLUTION | ORAL | Status: DC | PRN

## 2012-06-01 MED ORDER — STERILE WATER FOR IRRIGATION IR SOLN
Status: DC | PRN
  Administered 2012-06-01: 3000 mL

## 2012-06-01 MED ORDER — BUPIVACAINE ON-Q PAIN PUMP (FOR ORDER SET NO CHG)
INJECTION | Status: DC
  Filled 2012-06-01: qty 1

## 2012-06-01 MED ORDER — LACTATED RINGERS IV SOLN: INTRAVENOUS | Status: DC

## 2012-06-01 MED ORDER — LACTATED RINGERS IV SOLN
INTRAVENOUS | Status: DC | PRN
  Administered 2012-06-01 (×2): via INTRAVENOUS

## 2012-06-01 MED ORDER — SODIUM CHLORIDE 0.9 % IR SOLN
Status: DC | PRN
  Administered 2012-06-01: 1000 mL

## 2012-06-01 MED ORDER — DOCUSATE SODIUM 100 MG PO CAPS
100.0000 mg | ORAL_CAPSULE | Freq: Two times a day (BID) | ORAL | Status: DC
  Administered 2012-06-01 – 2012-06-03 (×4): 100 mg via ORAL

## 2012-06-01 MED ORDER — ACETAMINOPHEN 10 MG/ML IV SOLN
1000.0000 mg | Freq: Four times a day (QID) | INTRAVENOUS | Status: AC
  Administered 2012-06-01 – 2012-06-02 (×3): 1000 mg via INTRAVENOUS
  Filled 2012-06-01 (×7): qty 100

## 2012-06-01 MED ORDER — MEPERIDINE HCL 50 MG/ML IJ SOLN: 6.2500 mg | INTRAMUSCULAR | Status: DC | PRN

## 2012-06-01 MED ORDER — DEXAMETHASONE 6 MG PO TABS
10.0000 mg | ORAL_TABLET | Freq: Once | ORAL | Status: AC
  Administered 2012-06-02: 10 mg via ORAL
  Filled 2012-06-01: qty 1

## 2012-06-01 MED ORDER — POLYETHYLENE GLYCOL 3350 17 G PO PACK: 17.0000 g | PACK | Freq: Every day | ORAL | Status: DC | PRN

## 2012-06-01 MED ORDER — SODIUM CHLORIDE 0.9 % IV SOLN
INTRAVENOUS | Status: DC
  Administered 2012-06-01: 23:00:00 via INTRAVENOUS

## 2012-06-01 MED ORDER — LEVOTHYROXINE SODIUM 125 MCG PO TABS
125.0000 ug | ORAL_TABLET | Freq: Every day | ORAL | Status: DC
  Administered 2012-06-02 – 2012-06-03 (×2): 125 ug via ORAL
  Filled 2012-06-01 (×3): qty 1

## 2012-06-01 MED ORDER — DEXAMETHASONE SODIUM PHOSPHATE 10 MG/ML IJ SOLN: 10.0000 mg | Freq: Once | INTRAMUSCULAR | Status: AC

## 2012-06-01 MED ORDER — RIVAROXABAN 10 MG PO TABS
10.0000 mg | ORAL_TABLET | Freq: Every day | ORAL | Status: DC
  Administered 2012-06-02 – 2012-06-03 (×2): 10 mg via ORAL
  Filled 2012-06-01 (×4): qty 1

## 2012-06-01 MED ORDER — LEVOTHYROXINE SODIUM 112 MCG PO TABS: 112.0000 ug | ORAL_TABLET | ORAL | Status: DC

## 2012-06-01 MED ORDER — HETASTARCH-ELECTROLYTES 6 % IV SOLN
INTRAVENOUS | Status: DC | PRN
  Administered 2012-06-01: 08:00:00 via INTRAVENOUS

## 2012-06-01 MED ORDER — MENTHOL 3 MG MT LOZG
1.0000 | LOZENGE | OROMUCOSAL | Status: DC | PRN
  Filled 2012-06-01: qty 9

## 2012-06-01 MED ORDER — ONDANSETRON HCL 4 MG PO TABS: 4.0000 mg | ORAL_TABLET | Freq: Four times a day (QID) | ORAL | Status: DC | PRN

## 2012-06-01 MED ORDER — HYDROMORPHONE HCL PF 1 MG/ML IJ SOLN: 0.2500 mg | INTRAMUSCULAR | Status: DC | PRN

## 2012-06-01 MED ORDER — CEFAZOLIN SODIUM-DEXTROSE 2-3 GM-% IV SOLR
2.0000 g | INTRAVENOUS | Status: AC
  Administered 2012-06-01: 2 g via INTRAVENOUS

## 2012-06-01 MED ORDER — METHOCARBAMOL 100 MG/ML IJ SOLN
500.0000 mg | Freq: Four times a day (QID) | INTRAVENOUS | Status: DC | PRN
  Filled 2012-06-01: qty 5

## 2012-06-01 MED ORDER — METOCLOPRAMIDE HCL 10 MG PO TABS: 5.0000 mg | ORAL_TABLET | Freq: Three times a day (TID) | ORAL | Status: DC | PRN

## 2012-06-01 MED ORDER — PROPOFOL 10 MG/ML IV EMUL
INTRAVENOUS | Status: DC | PRN
  Administered 2012-06-01: 50 ug/kg/min via INTRAVENOUS

## 2012-06-01 MED ORDER — METOCLOPRAMIDE HCL 5 MG/ML IJ SOLN
5.0000 mg | Freq: Three times a day (TID) | INTRAMUSCULAR | Status: DC | PRN
  Administered 2012-06-01: 10 mg via INTRAVENOUS
  Filled 2012-06-01: qty 2

## 2012-06-01 MED ORDER — PROPOFOL 10 MG/ML IV BOLUS
INTRAVENOUS | Status: DC | PRN
  Administered 2012-06-01: 40 mg via INTRAVENOUS

## 2012-06-01 MED ORDER — ACETAMINOPHEN 325 MG PO TABS: 650.0000 mg | ORAL_TABLET | Freq: Four times a day (QID) | ORAL | Status: DC | PRN

## 2012-06-01 MED ORDER — OXYCODONE HCL 5 MG PO TABS
5.0000 mg | ORAL_TABLET | ORAL | Status: DC | PRN
  Administered 2012-06-01: 10 mg via ORAL
  Administered 2012-06-01: 5 mg via ORAL
  Administered 2012-06-02 (×4): 10 mg via ORAL
  Administered 2012-06-03: 5 mg via ORAL
  Filled 2012-06-01 (×2): qty 2
  Filled 2012-06-01 (×3): qty 1
  Filled 2012-06-01 (×2): qty 2
  Filled 2012-06-01: qty 1

## 2012-06-01 MED ORDER — BUPIVACAINE IN DEXTROSE 0.75-8.25 % IT SOLN
INTRATHECAL | Status: DC | PRN
  Administered 2012-06-01: 2 mL via INTRATHECAL

## 2012-06-01 MED ORDER — MORPHINE SULFATE 2 MG/ML IJ SOLN
1.0000 mg | INTRAMUSCULAR | Status: DC | PRN
  Administered 2012-06-01: 1 mg via INTRAVENOUS
  Filled 2012-06-01: qty 1

## 2012-06-01 MED ORDER — SODIUM CHLORIDE 0.9 % IJ SOLN
INTRAMUSCULAR | Status: DC | PRN
  Administered 2012-06-01: 08:00:00

## 2012-06-01 MED ORDER — TRAMADOL HCL 50 MG PO TABS: 50.0000 mg | ORAL_TABLET | Freq: Four times a day (QID) | ORAL | Status: DC | PRN

## 2012-06-01 MED ORDER — MIDAZOLAM HCL 5 MG/5ML IJ SOLN
INTRAMUSCULAR | Status: DC | PRN
  Administered 2012-06-01 (×2): 1 mg via INTRAVENOUS

## 2012-06-01 MED ORDER — BUPIVACAINE LIPOSOME 1.3 % IJ SUSP
20.0000 mL | Freq: Once | INTRAMUSCULAR | Status: DC
  Filled 2012-06-01: qty 20

## 2012-06-01 MED ORDER — ENALAPRIL MALEATE 20 MG PO TABS
20.0000 mg | ORAL_TABLET | Freq: Every day | ORAL | Status: DC
  Administered 2012-06-01 – 2012-06-02 (×2): 20 mg via ORAL
  Filled 2012-06-01 (×3): qty 1

## 2012-06-01 MED ORDER — FENTANYL CITRATE 0.05 MG/ML IJ SOLN
INTRAMUSCULAR | Status: DC | PRN
  Administered 2012-06-01: 50 ug via INTRAVENOUS

## 2012-06-01 MED ORDER — PHENOL 1.4 % MT LIQD
1.0000 | OROMUCOSAL | Status: DC | PRN
  Filled 2012-06-01: qty 177

## 2012-06-01 MED ORDER — METOPROLOL SUCCINATE ER 25 MG PO TB24
25.0000 mg | ORAL_TABLET | Freq: Every day | ORAL | Status: DC
  Administered 2012-06-02 – 2012-06-03 (×2): 25 mg via ORAL
  Filled 2012-06-01 (×2): qty 1

## 2012-06-01 MED ORDER — PHENYLEPHRINE HCL 10 MG/ML IJ SOLN
10.0000 mg | INTRAVENOUS | Status: DC | PRN
  Administered 2012-06-01: 20 ug/min via INTRAVENOUS

## 2012-06-01 MED ORDER — BISACODYL 10 MG RE SUPP: 10.0000 mg | Freq: Every day | RECTAL | Status: DC | PRN

## 2012-06-01 MED ORDER — PROMETHAZINE HCL 25 MG/ML IJ SOLN: 6.2500 mg | INTRAMUSCULAR | Status: DC | PRN

## 2012-06-01 MED ORDER — PROPOFOL 10 MG/ML IV EMUL: INTRAVENOUS | Status: DC | PRN

## 2012-06-01 MED ORDER — ACETAMINOPHEN 10 MG/ML IV SOLN
1000.0000 mg | Freq: Once | INTRAVENOUS | Status: AC
  Administered 2012-06-01: 1000 mg via INTRAVENOUS

## 2012-06-01 MED ORDER — CHLORHEXIDINE GLUCONATE 4 % EX LIQD
60.0000 mL | Freq: Once | CUTANEOUS | Status: DC
  Filled 2012-06-01: qty 60

## 2012-06-01 MED ORDER — 0.9 % SODIUM CHLORIDE (POUR BTL) OPTIME
TOPICAL | Status: DC | PRN
  Administered 2012-06-01: 1000 mL

## 2012-06-01 MED ORDER — SODIUM CHLORIDE 0.9 % IV SOLN: INTRAVENOUS | Status: DC

## 2012-06-01 SURGICAL SUPPLY — 56 items
BAG SPEC THK2 15X12 ZIP CLS (MISCELLANEOUS) ×1
BAG ZIPLOCK 12X15 (MISCELLANEOUS) ×2 IMPLANT
BANDAGE ELASTIC 4 VELCRO ST LF (GAUZE/BANDAGES/DRESSINGS) ×1 IMPLANT
BANDAGE ELASTIC 6 VELCRO ST LF (GAUZE/BANDAGES/DRESSINGS) ×2 IMPLANT
BANDAGE ESMARK 6X9 LF (GAUZE/BANDAGES/DRESSINGS) ×1 IMPLANT
BLADE SAG 18X100X1.27 (BLADE) ×2 IMPLANT
BLADE SAW SGTL 11.0X1.19X90.0M (BLADE) ×2 IMPLANT
BNDG CMPR 9X6 STRL LF SNTH (GAUZE/BANDAGES/DRESSINGS) ×1
BNDG ESMARK 6X9 LF (GAUZE/BANDAGES/DRESSINGS) ×2
BOWL SMART MIX CTS (DISPOSABLE) ×2 IMPLANT
CATH KIT ON-Q SILVERSOAK 5 (CATHETERS) ×1 IMPLANT
CATH KIT ON-Q SILVERSOAK 5IN (CATHETERS) IMPLANT
CEMENT HV SMART SET (Cement) ×3 IMPLANT
CLOTH BEACON ORANGE TIMEOUT ST (SAFETY) ×2 IMPLANT
CUFF TOURN SGL QUICK 34 (TOURNIQUET CUFF) ×2
CUFF TRNQT CYL 34X4X40X1 (TOURNIQUET CUFF) ×1 IMPLANT
DRAPE EXTREMITY T 121X128X90 (DRAPE) ×2 IMPLANT
DRAPE POUCH INSTRU U-SHP 10X18 (DRAPES) ×2 IMPLANT
DRAPE U-SHAPE 47X51 STRL (DRAPES) ×2 IMPLANT
DRSG ADAPTIC 3X8 NADH LF (GAUZE/BANDAGES/DRESSINGS) ×2 IMPLANT
DURAPREP 26ML APPLICATOR (WOUND CARE) ×2 IMPLANT
ELECT REM PT RETURN 9FT ADLT (ELECTROSURGICAL) ×2
ELECTRODE REM PT RTRN 9FT ADLT (ELECTROSURGICAL) ×1 IMPLANT
EVACUATOR 1/8 PVC DRAIN (DRAIN) ×2 IMPLANT
FACESHIELD LNG OPTICON STERILE (SAFETY) ×10 IMPLANT
GLOVE BIO SURGEON STRL SZ8 (GLOVE) ×2 IMPLANT
GLOVE BIOGEL PI IND STRL 8 (GLOVE) ×2 IMPLANT
GLOVE BIOGEL PI INDICATOR 8 (GLOVE) ×2
GLOVE ECLIPSE 8.0 STRL XLNG CF (GLOVE) ×2 IMPLANT
GLOVE SURG SS PI 6.5 STRL IVOR (GLOVE) ×4 IMPLANT
GOWN STRL NON-REIN LRG LVL3 (GOWN DISPOSABLE) ×4 IMPLANT
GOWN STRL REIN XL XLG (GOWN DISPOSABLE) ×2 IMPLANT
HANDPIECE INTERPULSE COAX TIP (DISPOSABLE) ×2
IMMOBILIZER KNEE 20 (SOFTGOODS) ×2
IMMOBILIZER KNEE 20 THIGH 36 (SOFTGOODS) ×1 IMPLANT
KIT BASIN OR (CUSTOM PROCEDURE TRAY) ×2 IMPLANT
MANIFOLD NEPTUNE II (INSTRUMENTS) ×2 IMPLANT
NS IRRIG 1000ML POUR BTL (IV SOLUTION) ×2 IMPLANT
PACK TOTAL JOINT (CUSTOM PROCEDURE TRAY) ×2 IMPLANT
PAD ABD 7.5X8 STRL (GAUZE/BANDAGES/DRESSINGS) ×2 IMPLANT
PADDING CAST ABS 6INX4YD NS (CAST SUPPLIES) ×1
PADDING CAST ABS COTTON 6X4 NS (CAST SUPPLIES) IMPLANT
PADDING CAST COTTON 6X4 STRL (CAST SUPPLIES) ×6 IMPLANT
POSITIONER SURGICAL ARM (MISCELLANEOUS) ×2 IMPLANT
SET HNDPC FAN SPRY TIP SCT (DISPOSABLE) ×1 IMPLANT
SPONGE GAUZE 4X4 12PLY (GAUZE/BANDAGES/DRESSINGS) ×2 IMPLANT
STRIP CLOSURE SKIN 1/2X4 (GAUZE/BANDAGES/DRESSINGS) ×4 IMPLANT
SUCTION FRAZIER 12FR DISP (SUCTIONS) ×2 IMPLANT
SUT MNCRL AB 4-0 PS2 18 (SUTURE) ×2 IMPLANT
SUT VIC AB 2-0 CT1 27 (SUTURE) ×6
SUT VIC AB 2-0 CT1 TAPERPNT 27 (SUTURE) ×3 IMPLANT
SUT VLOC 180 0 24IN GS25 (SUTURE) ×2 IMPLANT
TOWEL OR 17X26 10 PK STRL BLUE (TOWEL DISPOSABLE) ×4 IMPLANT
TRAY FOLEY CATH 14FRSI W/METER (CATHETERS) ×2 IMPLANT
WATER STERILE IRR 1500ML POUR (IV SOLUTION) ×2 IMPLANT
WRAP KNEE MAXI GEL POST OP (GAUZE/BANDAGES/DRESSINGS) ×4 IMPLANT

## 2012-06-01 NOTE — Evaluation (Signed)
Physical Therapy Evaluation Patient Details Name: [name] [name] MRN: [id number] DOB: 02/24/1941 Today's Date: 06/01/2012 Time: 4132-4401 PT Time Calculation (min): 37 min  PT Assessment / Plan / Recommendation Clinical Impression  72 y.o. male POD #0 for R TKA. Pt ambulated 7' with RW, min A for transfers. HHPT recommended, no DME needed. Pt doing very well with mobility. He would benefit from acute PT to maximize safety and independence with mobility.     PT Assessment  Patient needs continued PT services    Follow Up Recommendations  Home health PT    Does the patient have the potential to tolerate intense rehabilitation      Barriers to Discharge None      Equipment Recommendations  None recommended by PT    Recommendations for Other Services OT consult   Frequency 7X/week    Precautions / Restrictions     Pertinent Vitals/Pain **4/10 R knee pain with walking Premedicated, ice applied*      Mobility  Bed Mobility Bed Mobility: Supine to Sit Supine to Sit: 4: Min assist;HOB elevated Details for Bed Mobility Assistance: min A RLE Transfers Transfers: Sit to Stand;Stand to Sit Sit to Stand: 4: Min assist;From bed Stand to Sit: 4: Min guard;To chair/3-in-1 Details for Transfer Assistance: VCs hand placement Ambulation/Gait Ambulation/Gait Assistance: 4: Min guard Ambulation Distance (Feet): 80 Feet Assistive device: Rolling walker Gait Pattern: Step-to pattern General Gait Details: min/guard for safety    Shoulder Instructions     Exercises Total Joint Exercises Ankle Circles/Pumps: AROM;Both;10 reps;Supine Quad Sets: AROM;Both;5 reps;Supine Heel Slides: AAROM;Right;10 reps;Supine   PT Diagnosis: Difficulty walking;Acute pain  PT Problem List: Decreased strength;Decreased range of motion;Decreased mobility;Pain PT Treatment Interventions: Gait training;Stair training;Functional mobility training;Therapeutic activities;Therapeutic exercise;Patient/family  education   PT Goals Acute Rehab PT Goals PT Goal Formulation: With patient Time For Goal Achievement: 06/08/12 Potential to Achieve Goals: Good Pt will go Supine/Side to Sit: Independently;with HOB 0 degrees PT Goal: Supine/Side to Sit - Progress: Goal set today Pt will go Sit to Stand: with modified independence Pt will Ambulate: >150 feet;with modified independence PT Goal: Ambulate - Progress: Goal set today Pt will Go Up / Down Stairs: 3-5 stairs;with min assist;with rail(s) Pt will Perform Home Exercise Program: Independently PT Goal: Perform Home Exercise Program - Progress: Goal set today  Visit Information  Last PT Received On: 06/01/12    Subjective Data  Subjective: This one (TKA)  doesn't seem as bad as the first one.  Patient Stated Goal: play golf   Prior Functioning  Home Living Lives With: Spouse Available Help at Discharge: Family;Available 24 hours/day Home Access: Stairs to enter [employer] of Steps: 4 Entrance Stairs-Rails: Right Home Layout: One level Home Adaptive Equipment: Straight cane;Bedside commode/3-in-1;Walker - rolling Prior Function Level of Independence: Independent Able to Take Stairs?: Yes Driving: Yes Vocation: Retired Musician: No difficulties    Cognition  Overall Cognitive Status: Appears within functional limits for tasks assessed/performed Arousal/Alertness: Awake/alert Orientation Level: Appears intact for tasks assessed Behavior During Session: [facility] for tasks performed    Extremity/Trunk Assessment Right Upper Extremity Assessment RUE ROM/Strength/Tone: [facility] for tasks assessed Left Upper Extremity Assessment LUE ROM/Strength/Tone: WFL for tasks assessed Right Lower Extremity Assessment RLE ROM/Strength/Tone: Deficits;Due to pain RLE ROM/Strength/Tone Deficits: knee flexion AAROM approx 35*; ankle WNL, SLR -3/5 RLE Sensation: WFL - Light Touch RLE Coordination: WFL - gross/fine motor Left  Lower Extremity Assessment LLE ROM/Strength/Tone: Within functional levels LLE Sensation: WFL - Light Touch LLE Coordination:  WFL - gross/fine motor Trunk Assessment Trunk Assessment: Normal   Balance Balance Balance Assessed: Yes Static Sitting Balance Static Sitting - Balance Support: No upper extremity supported;Feet supported Static Sitting - Level of Assistance: 7: Independent Static Sitting - Comment/# of Minutes: 2  End of Session PT - End of Session Equipment Utilized During Treatment: Gait belt;Right knee immobilizer Activity Tolerance: Patient tolerated treatment well Patient left: in chair;with call bell/phone within reach Nurse Communication: Mobility status CPM Right Knee CPM Right Knee: Off  GP     Ralene Bathe Kistler 06/01/2012, 2:25 PM  [phone]

## 2012-06-01 NOTE — Addendum Note (Signed)
Addendum  created 06/01/12 1210 by Illene Silver, CRNA   Modules edited:Anesthesia Blocks and Procedures, Inpatient Notes

## 2012-06-01 NOTE — Transfer of Care (Signed)
Immediate Anesthesia Transfer of Care Note  Patient: [name] [name]  Procedure(s) Performed: Procedure(s) (LRB) with comments: TOTAL KNEE ARTHROPLASTY (Right)  Patient Location: PACU  Anesthesia Type:Spinal  Level of Consciousness: awake, alert , oriented and patient cooperative  Airway & Oxygen Therapy: Patient Spontanous Breathing and Patient connected to face mask oxygen  Post-op Assessment: Report given to PACU RN and Post -op Vital signs reviewed and stable  Post vital signs: stable  Complications: No apparent anesthesia complications T 12 level

## 2012-06-01 NOTE — H&P (View-Only) (Signed)
[name] [name]  DOB: 11/16/1940 Married / Language: English / Race: White Male  Date of Admission:  06/01/2012  Chief Complaint:  Right Knee Pain  History of Present Illness The patient is a 71 year old male who comes in for a preoperative History and Physical. The patient is scheduled for a right total knee arthroplasty to be performed by Dr. Frank V. Aluisio, MD at [facility] on 06/01/2012. The patient is being followed for their right knee pain and osteoarthritis. He states the right knee has been bothering him moreso lately. He says of course, it is better today. The right knee is starting to bother him as bad as the left knee did in the past. He is having pain that is limiting his activities. He said with regards to the left knee he has done great but feels like the right knee is holding him back now. Injections never helped with the left knee and have not helped with the right knee. He is at a stage where he is ready to get the knee fixed. They have been treated conservatively in the past for the above stated problem and despite conservative measures, they continue to have progressive pain and severe functional limitations and dysfunction. They have failed non-operative management including home exercise, medications, and injections. It is felt that they would benefit from undergoing total joint replacement. Risks and benefits of the procedure have been discussed with the patient and they elect to proceed with surgery. There are no active contraindications to surgery such as ongoing infection or rapidly progressive neurological disease.   Problem List Primary osteoarthritis of one knee (715.16) S/P Left total knee arthroplasty (V43.65)   Allergies Iodinated Contrast. "wired" Sulfanomides Doxy *TETRACYCLINES*. hematuria   Family History Father. Deceased, Heart disease, Myocardial infarction, Alzheimer's disease. 72 Mother. Deceased, Heart disease,  Myocardial infarction. 67 Heart Disease. mother and father Cancer. mother and sister   Social History Tobacco use. Former smoker. No alcohol use Post-Surgical Plans. Plan for home Alcohol use. former drinker Children. 1. 1 Number of flights of stairs before winded. 2-3 Marital status. Married. 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 Former smoker. former smoker Tobacco / smoke exposure. no Living situation. Lives with spouse. live with spouse Drug/Alcohol Rehab (Currently). no Current work status. Retired. retired Drug/Alcohol Rehab (Previously). no Illicit drug use. no Exercise. Exercises weekly; does gym / weights   Medication History Enalapril Maleate ( Oral) Specific dose unknown - Active. Synthroid ( Oral) Specific dose unknown - Active. Metoprolol Succinate ( Oral) Specific dose unknown - Active. Lipitor ( Oral) Specific dose unknown - Active. Aspirin EC Low Strength ( Oral) Specific dose unknown - Active. Multivitamins ( Oral) Specific dose unknown - Active. Fish Oil Burp-Less ( Oral) Specific dose unknown - Active. Co Q-10 (300MG Capsule, Oral) Active.   Past Surgical History Cholecystectomy. Date: 01/1996. Coronary Artery Bypass, Three. 2002 Gallbladder Surgery. laporoscopic Coronary Artery Bypass Graft. Date: 05/1999. 3 vessels Total Knee Replacement - Left. Date: 03/21/2011.   Medical History Measles. Childhood Mumps. Childhood Sleep Apnea. does not use CPAP History of Pneumonia Coronary Artery Disease/Heart Disease Osteoarthritis Hypothyroidism. secondary to treatment for hyperthyroidism Coronary Artery Bypass Graft Hypercholesterolemia Hypertension Gallbladder Problems Coronary artery disease   Review of Systems General:Not Present- Chills, Fever, Night Sweats, Fatigue, Weight Gain, Weight Loss and Memory Loss. Skin:Not Present- Hives, Itching, Rash, Eczema and Lesions. HEENT:Not Present- Tinnitus, Headache,  Double Vision, Visual Loss, Hearing Loss and Dentures. Respiratory:Not Present- Shortness of breath with exertion, Shortness   of breath at rest, Allergies, Coughing up blood and Chronic Cough. Cardiovascular:Not Present- Chest Pain, Racing/skipping heartbeats, Difficulty Breathing Lying Down, Murmur, Swelling and Palpitations. Gastrointestinal:Not Present- Bloody Stool, Heartburn, Abdominal Pain, Vomiting, Nausea, Constipation, Diarrhea, Difficulty Swallowing, Jaundice and Loss of appetitie. Male Genitourinary:Not Present- Urinary frequency, Blood in Urine, Weak urinary stream, Discharge, Flank Pain, Incontinence, Painful Urination, Urgency, Urinary Retention and Urinating at Night. Musculoskeletal:Not Present- Muscle Weakness, Muscle Pain, Joint Swelling, Joint Pain, Back Pain, Morning Stiffness and Spasms. Neurological:Not Present- Tremor, Dizziness, Blackout spells, Paralysis, Difficulty with balance and Weakness. Psychiatric:Not Present- Insomnia.   Vitals Weight: 176 lb Height: 68 in Weight was reported by patient. Height was reported by patient. Body Surface Area: 1.96 m Body Mass Index: 26.76 kg/m Pulse: 72 (Irregular) Resp.: 12 (Unlabored) BP: 144/82 (Sitting, Left Arm, Standard) Pulse rate is regular except for an occassional skipped or dropped beat on palpation.   Physical Exam The physical exam findings are as follows:  Note: Patient is a 71 year old male with continued right knee pain.   General Mental Status - Alert, cooperative and good historian. General Appearance- pleasant. Not in acute distress. Orientation- Oriented X3. Build & Nutrition- Well nourished and Well developed.   Head and Neck Head- normocephalic, atraumatic . Neck Global Assessment- supple. no bruit auscultated on the right and no bruit auscultated on the left.   Eye Vision- Wears corrective lenses. Pupil- Bilateral- Regular and Round. Motion- Bilateral-  EOMI.   Chest and Lung Exam Auscultation: Breath sounds:- clear at anterior chest wall and - clear at posterior chest wall. Adventitious sounds:- No Adventitious sounds.   Cardiovascular Auscultation:Rhythm- Regular rate and rhythm. Heart Sounds- S1 WNL and S2 WNL. Murmurs & Other Heart Sounds:Auscultation of the heart reveals - No Murmurs.   Abdomen Palpation/Percussion:Tenderness- Abdomen is non-tender to palpation. Rigidity (guarding)- Abdomen is soft. Auscultation:Auscultation of the abdomen reveals - Bowel sounds normal.   Male Genitourinary  Not done, not pertinent to present illness  Musculoskeletal  Well developed male alert and oriented in no apparent distress. Left knee looks fantastic. Absolutely no swelling. Range of motion 0 to 125 degrees. There is no instability. The right knee slight varus. Range 5 to 120. Tenderness medial greater than lateral with no instability noted. Pulses, sensation and motor are intact both lower extremities.  RADIOGRAPHS: AP both knees and lateral show the prosthesis on the left is in excellent position with no periprosthetic abnormalities. On the right he has bone on bone arthritis in the medial and patellofemoral compartments.  Assessment & Plan Primary osteoarthritis of one knee (715.16) Impression: Right Knee  Note: Plan is for a Right Total Knee Replacement by Dr. Aluisio.  Plan is to go to home.  PCP - Dr. Kimberlee Shaw Cardiology - Dr. Harding - Patient has been seen preoperatively and felt to be stable for surgery. Urology - Dr. Grapey  Signed electronically by DREW L PERKINS, PA-C 

## 2012-06-01 NOTE — Anesthesia Postprocedure Evaluation (Signed)
  Anesthesia Post-op Note  Patient: [name] [name]  Procedure(s) Performed: Procedure(s) (LRB): TOTAL KNEE ARTHROPLASTY (Right)  Patient Location: PACU  Anesthesia Type: Spinal  Level of Consciousness: awake and alert   Airway and Oxygen Therapy: Patient Spontanous Breathing  Post-op Pain: mild  Post-op Assessment: Post-op Vital signs reviewed, Patient's Cardiovascular Status Stable, Respiratory Function Stable, Patent Airway and No signs of Nausea or vomiting  Last Vitals:  Filed Vitals:   06/01/12 0915  BP: 123/59  Pulse: 52  Temp:   Resp: 15    Post-op Vital Signs: stable   Complications: No apparent anesthesia complications

## 2012-06-01 NOTE — Addendum Note (Signed)
Addendum  created 06/01/12 1210 by Sharra Cayabyab E Varie Machamer, CRNA   Modules edited:Anesthesia Blocks and Procedures, Inpatient Notes    

## 2012-06-01 NOTE — Anesthesia Procedure Notes (Addendum)
Spinal  Patient location during procedure: OR Start time: 06/01/2012 7:18 AM End time: 06/01/2012 7:24 AM Staffing CRNA/Resident: Hadden Steig E Performed by: resident/CRNA  Preanesthetic Checklist Completed: patient identified, site marked, surgical consent, pre-op evaluation, timeout performed, IV checked, risks and benefits discussed and monitors and equipment checked Spinal Block Patient position: sitting Prep: Betadine Patient monitoring: continuous pulse ox and blood pressure Approach: left paramedian Location: L3-4 Injection technique: single-shot Needle Needle gauge: 22 G Additional Notes CSF x 3, negative heme, negative paresthesia. Pt tolerated well.Lot [id number], expires12/2014  Bupivacaine .75%.  2cc's or 15mg 

## 2012-06-01 NOTE — Anesthesia Preprocedure Evaluation (Addendum)
Anesthesia Evaluation  Patient identified by MRN, date of birth, ID band Patient awake    Reviewed: Allergy & Precautions, H&P , NPO status , Patient's Chart, lab work & pertinent test results  History of Anesthesia Complications (+) PONV  Airway Mallampati: II TM Distance: >3 FB Neck ROM: Full    Dental No notable dental hx.    Pulmonary neg pulmonary ROS, sleep apnea and Continuous Positive Airway Pressure Ventilation ,  breath sounds clear to auscultation  Pulmonary exam normal       Cardiovascular hypertension, Pt. on medications + CAD and + CABG (2002) negative cardio ROS  Rhythm:Regular Rate:Normal     Neuro/Psych negative neurological ROS  negative psych ROS   GI/Hepatic negative GI ROS, Neg liver ROS,   Endo/Other  negative endocrine ROS  Renal/GU negative Renal ROS  negative genitourinary   Musculoskeletal negative musculoskeletal ROS (+)   Abdominal   Peds negative pediatric ROS (+)  Hematology negative hematology ROS (+)   Anesthesia Other Findings   Reproductive/Obstetrics negative OB ROS                          Anesthesia Physical Anesthesia Plan  ASA: III  Anesthesia Plan: Spinal   Post-op Pain Management:    Induction:   Airway Management Planned: Simple Face Mask  Additional Equipment:   Intra-op Plan:   Post-operative Plan:   Informed Consent: I have reviewed the patients History and Physical, chart, labs and discussed the procedure including the risks, benefits and alternatives for the proposed anesthesia with the patient or authorized representative who has indicated his/her understanding and acceptance.   Dental advisory given  Plan Discussed with: CRNA  Anesthesia Plan Comments:         Anesthesia Quick Evaluation

## 2012-06-01 NOTE — Interval H&P Note (Signed)
History and Physical Interval Note:  06/01/2012 7:11 AM  [name] [name]  has presented today for surgery, with the diagnosis of Osteoathritis of the Right Knee  The various methods of treatment have been discussed with the patient and family. After consideration of risks, benefits and other options for treatment, the patient has consented to  Procedure(s) (LRB) with comments: TOTAL KNEE ARTHROPLASTY (Right) as a surgical intervention .  The patient's history has been reviewed, patient examined, no change in status, stable for surgery.  I have reviewed the patient's chart and labs.  Questions were answered to the patient's satisfaction.     Loanne Drilling

## 2012-06-01 NOTE — Op Note (Signed)
Pre-operative diagnosis- Osteoarthritis  Right knee(s)  Post-operative diagnosis- Osteoarthritis Right knee(s)  Procedure-  Right  Total Knee Arthroplasty  Surgeon- Gus Rankin. Janell Keeling, MD  Assistant- Avel Peace, PA-C   Anesthesia-  Spinal EBL-* No blood loss amount entered *  Drains Hemovac  Tourniquet time-  Total Tourniquet Time Documented: area (laterality) - 35 minutes   Complications- None  Condition-PACU - hemodynamically stable.   Brief Clinical Note  [name] [name] is a 72 y.o. year old male with end stage OA of his right knee with progressively worsening pain and dysfunction. He has constant pain, with activity and at rest and significant functional deficits with difficulties even with ADLs. He has had extensive non-op management including analgesics, injections of cortisone and viscosupplements, and home exercise program, but remains in significant pain with significant dysfunction. Radiographs show bone on bone arthritis medial compartment with varus deformity and osteophytes.He presents now for right Total Knee Arthroplasty.    Procedure in detail---   The patient is brought into the operating room and positioned supine on the operating table. After successful administration of  Spinal,   a tourniquet is placed high on the  Right thigh(s) and the lower extremity is prepped and draped in the usual sterile fashion. Time out is performed by the operating team and then the  Right lower extremity is wrapped in Esmarch, knee flexed and the tourniquet inflated to 300 mmHg.       A midline incision is made with a ten blade through the subcutaneous tissue to the level of the extensor mechanism. A fresh blade is used to make a medial parapatellar arthrotomy. Soft tissue over the proximal medial tibia is subperiosteally elevated to the joint line with a knife and into the semimembranosus bursa with a Cobb elevator. Soft tissue over the proximal lateral tibia is elevated with attention  being paid to avoiding the patellar tendon on the tibial tubercle. The patella is everted, knee flexed 90 degrees and the ACL and PCL are removed. Findings are bone on bone medial with large medial osteophytes.        The drill is used to create a starting hole in the distal femur and the canal is thoroughly irrigated with sterile saline to remove the fatty contents. The 5 degree Right  valgus alignment guide is placed into the femoral canal and the distal femoral cutting block is pinned to remove 10 mm off the distal femur. Resection is made with an oscillating saw.      The tibia is subluxed forward and the menisci are removed. The extramedullary alignment guide is placed referencing proximally at the medial aspect of the tibial tubercle and distally along the second metatarsal axis and tibial crest. The block is pinned to remove 2mm off the more deficient medial  side. Resection is made with an oscillating saw. Size 4is the most appropriate size for the tibia and the proximal tibia is prepared with the modular drill and keel punch for that size.      The femoral sizing guide is placed and size 4 is most appropriate. Rotation is marked off the epicondylar axis and confirmed by creating a rectangular flexion gap at 90 degrees. The size 4 cutting block is pinned in this rotation and the anterior, posterior and chamfer cuts are made with the oscillating saw. The intercondylar block is then placed and that cut is made.      Trial size 4 tibial component, trial size 4 posterior stabilized femur and a 10  mm posterior stabilized rotating platform insert trial is placed. Full extension is achieved with excellent varus/valgus and anterior/posterior balance throughout full range of motion. The patella is everted and thickness measured to be 27  mm. Free hand resection is taken to 15 mm, a 41 template is placed, lug holes are drilled, trial patella is placed, and it tracks normally. Osteophytes are removed off the  posterior femur with the trial in place. All trials are removed and the cut bone surfaces prepared with pulsatile lavage. Cement is mixed and once ready for implantation, the size 4 tibial implant, size  4 posterior stabilized femoral component, and the size 41 patella are cemented in place and the patella is held with the clamp. The trial insert is placed and the knee held in full extension. The Exparel (20 ml mixed with 50 ml saline) is injected into the extensor mechanism, posterior capsule, medial and lateral gutters and subcutaneous tissues.  All extruded cement is removed and once the cement is hard the permanent 10 mm posterior stabilized rotating platform insert is placed into the tibial tray.      The wound is copiously irrigated with saline solution and the extensor mechanism closed over a hemovac drain with #1 PDS suture. The tourniquet is released for a total tourniquet time of 35  minutes. Flexion against gravity is 140 degrees and the patella tracks normally. Subcutaneous tissue is closed with 2.0 vicryl and subcuticular with running 4.0 Monocryl. The incision is cleaned and dried and steri-strips and a bulky sterile dressing are applied. The limb is placed into a knee immobilizer and the patient is awakened and transported to recovery in stable condition.      Please note that a surgical assistant was a medical necessity for this procedure in order to perform it in a safe and expeditious manner. Surgical assistant was necessary to retract the ligaments and vital neurovascular structures to prevent injury to them and also necessary for proper positioning of the limb to allow for anatomic placement of the prosthesis.   Gus Rankin Martita Brumm, MD    06/01/2012, 8:32 AM

## 2012-06-02 LAB — BASIC METABOLIC PANEL
CO2: 27 mEq/L (ref 19–32)
Calcium: 8.2 mg/dL — ABNORMAL LOW (ref 8.4–10.5)
Chloride: 100 mEq/L (ref 96–112)
Glucose, Bld: 119 mg/dL — ABNORMAL HIGH (ref 70–99)
Potassium: 4.8 mEq/L (ref 3.5–5.1)
Sodium: 132 mEq/L — ABNORMAL LOW (ref 135–145)

## 2012-06-02 LAB — CBC
Hemoglobin: 9.9 g/dL — ABNORMAL LOW (ref 13.0–17.0)
MCH: 30.9 pg (ref 26.0–34.0)
Platelets: 165 10*3/uL (ref 150–400)
RBC: 3.2 MIL/uL — ABNORMAL LOW (ref 4.22–5.81)
WBC: 9 10*3/uL (ref 4.0–10.5)

## 2012-06-02 MED ORDER — RIVAROXABAN 10 MG PO TABS: 10.0000 mg | ORAL_TABLET | Freq: Every day | ORAL | Status: DC

## 2012-06-02 MED ORDER — METHOCARBAMOL 500 MG PO TABS: 500.0000 mg | ORAL_TABLET | Freq: Four times a day (QID) | ORAL | Status: DC | PRN

## 2012-06-02 MED ORDER — OXYCODONE HCL 5 MG PO TABS: 5.0000 mg | ORAL_TABLET | ORAL | Status: DC | PRN

## 2012-06-02 MED ORDER — TRAMADOL HCL 50 MG PO TABS: 50.0000 mg | ORAL_TABLET | Freq: Four times a day (QID) | ORAL | Status: DC | PRN

## 2012-06-02 NOTE — Evaluation (Signed)
Occupational Therapy Evaluation Patient Details Name: [name] [name] MRN: [id number] DOB: 13-Apr-1941 Today's Date: 06/02/2012 Time: 4098-1191 OT Time Calculation (min): 24 min  OT Assessment / Plan / Recommendation Clinical Impression  Pt admitted for R TKA, this being his second.  He is progressing well in mobility for ADL.  He has all necessary equipment at home.  Wife will be available to assist with LB ADL until he is able to perform independently.  No further OT needs.    OT Assessment  Patient does not need any further OT services    Follow Up Recommendations  No OT follow up    Barriers to Discharge      Equipment Recommendations  None recommended by OT    Recommendations for Other Services    Frequency       Precautions / Restrictions Precautions Precautions: Fall;Knee Required Braces or Orthoses: Knee Immobilizer - Right Restrictions Weight Bearing Restrictions: No   Pertinent Vitals/Pain 3/10 r knee, premedicated, ice applied    ADL  Eating/Feeding: Independent Where Assessed - Eating/Feeding: Chair Grooming: Wash/dry hands;Wash/dry face;Min guard Where Assessed - Grooming: Unsupported standing Upper Body Bathing: Set up Where Assessed - Upper Body Bathing: Unsupported sitting Lower Body Bathing: Minimal assistance Where Assessed - Lower Body Bathing: Unsupported sitting;Supported sit to stand Upper Body Dressing: Set up Where Assessed - Upper Body Dressing: Unsupported sitting Lower Body Dressing: Moderate assistance Where Assessed - Lower Body Dressing: Unsupported sitting;Supported sit to stand Toilet Transfer: Min Pension scheme manager Method: Sit to stand Tub/Shower Transfer: Other (comment) (verbally instructed in technique) Tub/Shower Transfer Method: Science writer: Other (comment) (will use 3 in 1) Equipment Used: Rolling walker;Gait belt Transfers/Ambulation Related to ADLs: Pt ambulating with RW and min guard assist.   c/o light headedness, but BP ok. ADL Comments: Pt had assist of his wife for LB ADL until he was able to perform with his last knee replacement, will do the same this time.      OT Diagnosis:    OT Problem List:   OT Treatment Interventions:     OT Goals    Visit Information  Last OT Received On: 06/02/12 Assistance Needed: +1    Subjective Data  Subjective: "This one is so much easier than my last knee replacement." Patient Stated Goal: Home with assist of wife.   Prior Functioning     Home Living Lives With: Spouse Available Help at Discharge: Family;Available 24 hours/day Type of Home: House Home Access: Stairs to enter [employer] of Steps: 4 Entrance Stairs-Rails: Right Home Layout: One level Bathroom Shower/Tub: Walk-in shower;Door [employer] Toilet: Handicapped height Bathroom Accessibility: Yes How Accessible: Accessible via walker Home Adaptive Equipment: Straight cane;Bedside commode/3-in-1;Walker - rolling Prior Function Level of Independence: Independent Able to Take Stairs?: Yes Driving: Yes Vocation: Retired Comments: wants to Psychologist, counselling: No difficulties Dominant Hand: Right         Vision/Perception     Cognition  Overall Cognitive Status: Appears within functional limits for tasks assessed/performed Arousal/Alertness: Awake/alert Orientation Level: Appears intact for tasks assessed Behavior During Session: [facility] for tasks performed    Extremity/Trunk Assessment Right Upper Extremity Assessment RUE ROM/Strength/Tone: [facility] for tasks assessed Left Upper Extremity Assessment LUE ROM/Strength/Tone: [facility] for tasks assessed Trunk Assessment Trunk Assessment: Normal     Mobility Bed Mobility Bed Mobility: Supine to Sit;Sitting - Scoot to Edge of Bed Supine to Sit: 5: Supervision;HOB elevated Sitting - Scoot to Edge of Bed: 5: Supervision Details for Bed Mobility  Assistance: used UEs to move R LE, immobilizer  on Transfers Transfers: Sit to Stand;Stand to Sit Sit to Stand: 4: Min guard;With upper extremity assist;From bed Stand to Sit: 4: Min guard;With upper extremity assist;To chair/3-in-1 Details for Transfer Assistance: VCs hand placement     Shoulder Instructions     Exercise     Balance     End of Session OT - End of Session Activity Tolerance: Patient tolerated treatment well Patient left: in chair;with call bell/phone within reach;with nursing in room Nurse Communication: Other (comment) (pt with dizziness)   GO     Evern Bio 06/02/2012, 10:24 AM [phone]

## 2012-06-02 NOTE — Progress Notes (Addendum)
Subjective: 1 Day Post-Op Procedure(s) (LRB): TOTAL KNEE ARTHROPLASTY (Right) Patient reports pain as 3 on 0-10 scale.    Objective: Vital signs in last 24 hours: Temp:  [97.7 F (36.5 C)-99.5 F (37.5 C)] 99.5 F (37.5 C) (01/25 0630) Pulse Rate:  [50-72] 71  (01/25 0630) Resp:  [11-16] 16  (01/25 0630) BP: (101-152)/(59-80) 152/69 mmHg (01/25 0630) SpO2:  [100 %] 100 % (01/25 0630) Weight:  [79.379 kg (175 lb)] 79.379 kg (175 lb) (01/24 1030)  Intake/Output from previous day: 01/24 0701 - 01/25 0700 In: 3468.8 [P.O.:480; I.V.:2238.8; IV Piggyback:750] Out: 2030 [Urine:1500; Drains:455; Blood:75] Intake/Output this shift:     Basename 06/02/12 0518  HGB 9.9*    Basename 06/02/12 0518  WBC 9.0  RBC 3.20*  HCT 28.5*  PLT 165    Basename 06/02/12 0518  NA 132*  K 4.8  CL 100  CO2 27  BUN 17  CREATININE 0.87  GLUCOSE 119*  CALCIUM 8.2*   No results found for this basename: LABPT:2,INR:2 in the last 72 hours  Neurologically intact Sensation intact distally Intact pulses distally Incision: dressing C/D/I No DVT. HV D/C'd tip ok Assessment/Plan: 1 Day Post-Op Procedure(s) (LRB): TOTAL KNEE ARTHROPLASTY (Right) doing well. Advance diet Up with therapy D/C IV fluids  BEANE,JEFFREY C 06/02/2012, 7:09 AM  Possible discharge home tomorrow if doing well medically and if PT goals accomplished.

## 2012-06-02 NOTE — Progress Notes (Signed)
Physical Therapy Treatment Patient Details Name: [name] [name] MRN: [id number] DOB: Apr 27, 1941 Today's Date: 06/02/2012 Time: 4098-1191 PT Time Calculation (min): 24 min  PT Assessment / Plan / Recommendation Comments on Treatment Session  continues to improve, R knee tends to stay flexed, propped to increase stretching.    Follow Up Recommendations  Home health PT     Does the patient have the potential to tolerate intense rehabilitation     Barriers to Discharge        Equipment Recommendations  None recommended by PT    Recommendations for Other Services    Frequency 7X/week   Plan Discharge plan remains appropriate;Frequency remains appropriate    Precautions / Restrictions Precautions Precautions: Knee Required Braces or Orthoses: Knee Immobilizer - Right   Pertinent Vitals/Pain     Mobility  Bed Mobility Supine to Sit: 5: Supervision Sitting - Scoot to Edge of Bed: 4: Min guard Details for Bed Mobility Assistance: assist RLE onto bed. Transfers Sit to Stand: 4: Min guard;From chair/3-in-1;With upper extremity assist Stand to Sit: To bed;4: Min guard Details for Transfer Assistance: vcs hand and RLE placement Ambulation/Gait Ambulation/Gait Assistance: 4: Min guard Ambulation Distance (Feet): 200 Feet Assistive device: Rolling walker Ambulation/Gait Assistance Details: cues for sequence Gait Pattern: Step-to pattern;Decreased stance time - right;Antalgic    Exercises Total Joint Exercises Quad Sets: AROM;Right;10 reps Short Arc Quad: AROM;Right;10 reps Heel Slides: AAROM;Right;10 reps Straight Leg Raises: AAROM;10 reps;Right;Supine Knee Flexion: AROM;Right;Seated   PT Diagnosis:    PT Problem List:   PT Treatment Interventions:     PT Goals Acute Rehab PT Goals Pt will go Sit to Stand: with modified independence PT Goal: Sit to Stand - Progress: Progressing toward goal Pt will Ambulate: >150 feet;with modified independence;with rolling walker PT  Goal: Ambulate - Progress: Progressing toward goal Pt will Go Up / Down Stairs: 3-5 stairs;with min assist;with rail(s) PT Goal: Up/Down Stairs - Progress: Progressing toward goal PT Goal: Perform Home Exercise Program - Progress: Progressing toward goal  Visit Information  Last PT Received On: 06/02/12 Assistance Needed: +1    Subjective Data  Subjective: I am ready to go   Cognition  Overall Cognitive Status: Appears within functional limits for tasks assessed/performed    Balance     End of Session PT - End of Session Equipment Utilized During Treatment:  (no KI) Activity Tolerance: Patient tolerated treatment well Patient left: in bed;with call bell/phone within reach Nurse Communication: Mobility status   GP     [name] 06/02/2012, 4:11 PM

## 2012-06-02 NOTE — Progress Notes (Signed)
Physical Therapy Treatment Patient Details Name: [name] [name] MRN: [id number] DOB: 02/01/1941 Today's Date: 06/02/2012 Time: 4098-1191 PT Time Calculation (min): 24 min  PT Assessment / Plan / Recommendation Comments on Treatment Session  Pt. tolerated well, improved strength of R quads.    Follow Up Recommendations  Home health PT     Does the patient have the potential to tolerate intense rehabilitation     Barriers to Discharge        Equipment Recommendations  None recommended by PT    Recommendations for Other Services    Frequency 7X/week   Plan Discharge plan remains appropriate;Frequency remains appropriate    Precautions / Restrictions Precautions Precautions: Knee Required Braces or Orthoses: Knee Immobilizer - Right   Pertinent Vitals/Pain 4 R knee    Mobility  Transfers Sit to Stand: 4: Min guard;From chair/3-in-1;With upper extremity assist Stand to Sit: 4: Min guard;With upper extremity assist;To chair/3-in-1 Details for Transfer Assistance: vcs hand and RLE placement Ambulation/Gait Ambulation/Gait Assistance: 4: Min guard Ambulation Distance (Feet): 100 Feet Assistive device: Rolling walker Ambulation/Gait Assistance Details: cues for sequence Gait Pattern: Step-to pattern;Decreased stance time - right;Antalgic    Exercises Total Joint Exercises Quad Sets: AROM;Right;10 reps Short Arc Quad: AROM;Right;10 reps Heel Slides: AAROM;Right;10 reps Straight Leg Raises: AAROM;Right;10 reps Knee Flexion: AROM;Right;Seated   PT Diagnosis:    PT Problem List:   PT Treatment Interventions:     PT Goals Acute Rehab PT Goals Pt will go Sit to Stand: with modified independence PT Goal: Sit to Stand - Progress: Progressing toward goal Pt will Ambulate: >150 feet;with modified independence;with rolling walker PT Goal: Ambulate - Progress: Progressing toward goal Pt will Go Up / Down Stairs: 3-5 stairs;with min assist;with rail(s) PT Goal: Up/Down Stairs  - Progress: Progressing toward goal PT Goal: Perform Home Exercise Program - Progress: Progressing toward goal  Visit Information  Last PT Received On: 06/02/12 Assistance Needed: +1    Subjective Data  Subjective: I was a little woozy, I am better.   Cognition  Overall Cognitive Status: Appears within functional limits for tasks assessed/performed    Balance     End of Session PT - End of Session Activity Tolerance: Patient tolerated treatment well Patient left: in chair;with call bell/phone within reach Nurse Communication: Mobility status   GP     [name] 06/02/2012, 4:08 PM

## 2012-06-03 LAB — BASIC METABOLIC PANEL
BUN: 19 mg/dL (ref 6–23)
CO2: 24 mEq/L (ref 19–32)
Chloride: 98 mEq/L (ref 96–112)
Creatinine, Ser: 0.94 mg/dL (ref 0.50–1.35)
Glucose, Bld: 156 mg/dL — ABNORMAL HIGH (ref 70–99)
Potassium: 4.4 mEq/L (ref 3.5–5.1)

## 2012-06-03 LAB — CBC
HCT: 27.9 % — ABNORMAL LOW (ref 39.0–52.0)
Hemoglobin: 9.9 g/dL — ABNORMAL LOW (ref 13.0–17.0)
MCV: 87.7 fL (ref 78.0–100.0)
RBC: 3.18 MIL/uL — ABNORMAL LOW (ref 4.22–5.81)
RDW: 12.5 % (ref 11.5–15.5)
WBC: 13.9 10*3/uL — ABNORMAL HIGH (ref 4.0–10.5)

## 2012-06-03 NOTE — Plan of Care (Signed)
Problem: Discharge Progression Outcomes Goal: Anticoagulant follow-up in place Outcome: Not Applicable Date Met:  06/03/12 xarelto

## 2012-06-03 NOTE — Care Management (Signed)
Pt plan for d/c today and [facility] liaison Lupita Leash is in the hospital helping the pt to get set up with disposition needs. No further needs from CM at this time. Thanks Gala Lewandowsky, RN,BSN [phone]

## 2012-06-03 NOTE — Progress Notes (Signed)
   Subjective: 2 Days Post-Op Procedure(s) (LRB): TOTAL KNEE ARTHROPLASTY (Right)   Patient reports pain as mild, pain well controlled. No events throughout the night. Ready to be discharged home.  Objective:   VITALS:   Filed Vitals:   06/03/12 0600  BP: 152/78  Pulse: 74  Temp: 98.4 F (36.9 C)  Resp: 18    Neurovascular intact Dorsiflexion/Plantar flexion intact Incision: dressing C/D/I No cellulitis present Compartment soft  LABS  Basename 06/03/12 0433 06/02/12 0518  HGB 9.9* 9.9*  HCT 27.9* 28.5*  WBC 13.9* 9.0  PLT 192 165     Basename 06/03/12 0433 06/02/12 0518  NA 132* 132*  K 4.4 4.8  BUN 19 17  CREATININE 0.94 0.87  GLUCOSE 156* 119*     Assessment/Plan: 2 Days Post-Op Procedure(s) (LRB): TOTAL KNEE ARTHROPLASTY (Right)  States that he felt weird after taking 2 oxycodone, instructed to take the least amount needed to help control pain. Dressing changed Up with therapy Discharge home with home health Follow up in 2 weeks at [facility]. Follow up with Dr. Lequita Halt  in 2 weeks.  Contact information:  [facility] [address], [address] [place] [address] [phone]       Anastasio Auerbach. Daphnee Preiss   PAC  06/03/2012, 9:28 AM

## 2012-06-03 NOTE — Progress Notes (Signed)
Physical Therapy Treatment Patient Details Name: [name] [name] MRN: [id number] DOB: 29-Apr-1941 Today's Date: 06/03/2012 Time: 7829-5621 PT Time Calculation (min): 17 min  PT Assessment / Plan / Recommendation Comments on Treatment Session  Pt is ready for DC    Follow Up Recommendations  Home health PT     Does the patient have the potential to tolerate intense rehabilitation     Barriers to Discharge        Equipment Recommendations  None recommended by PT    Recommendations for Other Services    Frequency     Plan      Precautions / Restrictions Precautions Precautions: Knee Required Braces or Orthoses: Knee Immobilizer - Right       Mobility  Transfers Sit to Stand: 5: Supervision;From chair/3-in-1 Stand to Sit: To chair/3-in-1;With upper extremity assist;5: Supervision Ambulation/Gait Ambulation/Gait Assistance: 5: Supervision Ambulation Distance (Feet): 200 Feet Assistive device: Rolling walker Gait Pattern: Step-to pattern;Antalgic Stairs: Yes Stairs Assistance: 4: Min assist Stairs Assistance Details (indicate cue type and reason): sequence  use of Rail and cane. tried sideways but pt unable to fully WB RLE Stair Management Technique: One rail Right;Step to pattern;Forwards Number of Stairs: 2     Exercises     PT Diagnosis:    PT Problem List:   PT Treatment Interventions:     PT Goals Acute Rehab PT Goals Pt will go Sit to Stand: with modified independence PT Goal: Sit to Stand - Progress: Progressing toward goal Pt will Ambulate: >150 feet;with modified independence;with rolling walker PT Goal: Ambulate - Progress: Progressing toward goal Pt will Go Up / Down Stairs: 3-5 stairs;with min assist;with rail(s) PT Goal: Up/Down Stairs - Progress: Met  Visit Information  Last PT Received On: 06/03/12 Assistance Needed: +1    Subjective Data  Subjective: I can use my cane and the rail.   Cognition  Overall Cognitive Status: Appears within  functional limits for tasks assessed/performed    Balance     End of Session PT - End of Session Equipment Utilized During Treatment: Right knee immobilizer Activity Tolerance: Patient tolerated treatment well Patient left: in chair;with call bell/phone within reach Nurse Communication: Mobility status   GP     [name] 06/03/2012, 3:46 PM

## 2012-06-03 NOTE — Progress Notes (Signed)
Discharged from floor via w/c, family with pt. No changes in assessment. [name] [name]  

## 2012-06-03 NOTE — Care Management Note (Signed)
    Page 1 of 1   06/03/2012     12:38:12 PM   CARE MANAGEMENT NOTE 06/03/2012  Patient:  [name] [name], [name] [name]   Account Number:  [id number]  Date Initiated:  06/03/2012  Documentation initiated by:  GRAVES-BIGELOW,Teretha Chalupa  Subjective/Objective Assessment:   Pt admitted for R TKA.  Plan for d/c home today.     Action/Plan:   CM referral for [facility] services and plan for [facility].   Anticipated DC Date:  06/03/2012   Anticipated DC Plan:  HOME W HOME HEALTH SERVICES      DC Planning Services  CM consult      [facility] Choice  HOME HEALTH   Choice offered to / List presented to:  C-1 Patient        HH arranged  HH-2 PT      [facility] agency  [facility]   Status of service:  Completed, signed off Medicare Important Message given?   (If response is "NO", the following Medicare IM given date fields will be blank) Date Medicare IM given:   Date Additional Medicare IM given:    Discharge Disposition:  HOME W HOME HEALTH SERVICES  Per UR Regulation:  Reviewed for med. necessity/level of care/duration of stay  If discussed at Long Length of Stay Meetings, dates discussed:    Comments:

## 2012-06-04 ENCOUNTER — Encounter (HOSPITAL_COMMUNITY): Payer: Self-pay | Admitting: Orthopedic Surgery

## 2012-06-07 NOTE — Discharge Summary (Signed)
Physician Discharge Summary   Patient ID: [name] [name] MRN: [id number] DOB/AGE: March 15, 1941 72 y.o.  Admit date: 06/01/2012 Discharge date: 06/03/2012  Primary Diagnosis:  Osteoarthritis Right knee  Admission Diagnoses:  Past Medical History  Diagnosis Date  . Coronary artery disease   . Hypertension   . Sleep apnea     UNABLE TO ADJUST TO USING C-PAP  . Hypothyroidism   . Hematuria     OCASIONAL HEMATURIA UNK CAUSE  . Arthritis   . PONV (postoperative nausea and vomiting)    Discharge Diagnoses:   Principal Problem:  *OA (osteoarthritis) of knee  Estimated Body mass index is 26.61 kg/(m^2) as calculated from the following:   Height as of this encounter: 5\' 8" (1.727 m).   Weight as of this encounter: 175 lb(79.379 kg).  Classification of overweight in adults according to BMI (WHO, 1998)   Procedure:  Procedure(s) (LRB): TOTAL KNEE ARTHROPLASTY (Right)   Consults: None  HPI: [name] [name] is a 72 y.o. year old male with end stage OA of his right knee with progressively worsening pain and dysfunction. He has constant pain, with activity and at rest and significant functional deficits with difficulties even with ADLs. He has had extensive non-op management including analgesics, injections of cortisone and viscosupplements, and home exercise program, but remains in significant pain with significant dysfunction. Radiographs show bone on bone arthritis medial compartment with varus deformity and osteophytes.He presents now for right Total Knee Arthroplasty.  Laboratory Data: Admission on 06/01/2012, Discharged on 06/03/2012  Component Date Value Range Status  . ABO/RH(D) 06/01/2012 A POS   Final  . Antibody Screen 06/01/2012 NEG   Final  . Sample Expiration 06/01/2012 06/04/2012   Final  . WBC 06/02/2012 9.0  4.0 - 10.5 K/uL Final  . RBC 06/02/2012 3.20* 4.22 - 5.81 MIL/uL Final  . Hemoglobin 06/02/2012 9.9* 13.0 - 17.0 g/dL Final  . HCT 11/91/4782 28.5* 39.0 - 52.0 %  Final  . MCV 06/02/2012 89.1  78.0 - 100.0 fL Final  . MCH 06/02/2012 30.9  26.0 - 34.0 pg Final  . MCHC 06/02/2012 34.7  30.0 - 36.0 g/dL Final  . RDW 95/62/1308 12.4  11.5 - 15.5 % Final  . Platelets 06/02/2012 165  150 - 400 K/uL Final  . Sodium 06/02/2012 132* 135 - 145 mEq/L Final  . Potassium 06/02/2012 4.8  3.5 - 5.1 mEq/L Final  . Chloride 06/02/2012 100  96 - 112 mEq/L Final  . CO2 06/02/2012 27  19 - 32 mEq/L Final  . Glucose, Bld 06/02/2012 119* 70 - 99 mg/dL Final  . BUN 65/78/4696 17  6 - 23 mg/dL Final  . Creatinine, Ser 06/02/2012 0.87  0.50 - 1.35 mg/dL Final  . Calcium 29/52/8413 8.2* 8.4 - 10.5 mg/dL Final  . GFR calc non Af Amer 06/02/2012 84* >90 mL/min Final  . GFR calc Af Amer 06/02/2012 >90  >90 mL/min Final   Comment:                                 The eGFR has been calculated                          using the CKD EPI equation.                          This calculation has not  been                          validated in all clinical                          situations.                          eGFR's persistently                          <90 mL/min signify                          possible Chronic Kidney Disease.  . WBC 06/03/2012 13.9* 4.0 - 10.5 K/uL Final  . RBC 06/03/2012 3.18* 4.22 - 5.81 MIL/uL Final  . Hemoglobin 06/03/2012 9.9* 13.0 - 17.0 g/dL Final  . HCT 16/02/9603 27.9* 39.0 - 52.0 % Final  . MCV 06/03/2012 87.7  78.0 - 100.0 fL Final  . MCH 06/03/2012 31.1  26.0 - 34.0 pg Final  . MCHC 06/03/2012 35.5  30.0 - 36.0 g/dL Final  . RDW 54/01/8118 12.5  11.5 - 15.5 % Final  . Platelets 06/03/2012 192  150 - 400 K/uL Final  . Sodium 06/03/2012 132* 135 - 145 mEq/L Final  . Potassium 06/03/2012 4.4  3.5 - 5.1 mEq/L Final  . Chloride 06/03/2012 98  96 - 112 mEq/L Final  . CO2 06/03/2012 24  19 - 32 mEq/L Final  . Glucose, Bld 06/03/2012 156* 70 - 99 mg/dL Final  . BUN 14/78/2956 19  6 - 23 mg/dL Final  . Creatinine, Ser 06/03/2012 0.94  0.50 -  1.35 mg/dL Final  . Calcium 21/30/8657 9.4  8.4 - 10.5 mg/dL Final  . GFR calc non Af Amer 06/03/2012 82* >90 mL/min Final  . GFR calc Af Amer 06/03/2012 >90  >90 mL/min Final   Comment:                                 The eGFR has been calculated                          using the CKD EPI equation.                          This calculation has not been                          validated in all clinical                          situations.                          eGFR's persistently                          <90 mL/min signify                          possible Chronic Kidney Disease.  Hospital Outpatient Visit on 05/24/2012  Component Date Value Range Status  .  MRSA, PCR 05/24/2012 NEGATIVE  NEGATIVE Final  . Staphylococcus aureus 05/24/2012 NEGATIVE  NEGATIVE Final   Comment:                                 The Xpert SA Assay (FDA                          approved for NASAL specimens                          in patients over 8 years of age),                          is one component of                          a comprehensive surveillance                          program.  Test performance has                          been validated by [employer] for patients greater                          than or equal to 61 year old.                          It is not intended                          to diagnose infection nor to                          guide or monitor treatment.  [name] aPTT 05/24/2012 31  24 - 37 seconds Final  . WBC 05/24/2012 6.1  4.0 - 10.5 K/uL Final  . RBC 05/24/2012 4.53  4.22 - 5.81 MIL/uL Final  . Hemoglobin 05/24/2012 14.0  13.0 - 17.0 g/dL Final  . HCT 16/02/9603 40.4  39.0 - 52.0 % Final  . MCV 05/24/2012 89.2  78.0 - 100.0 fL Final  . MCH 05/24/2012 30.9  26.0 - 34.0 pg Final  . MCHC 05/24/2012 34.7  30.0 - 36.0 g/dL Final  . RDW 54/01/8118 12.1  11.5 - 15.5 % Final  . Platelets 05/24/2012 238  150 - 400 K/uL Final  . Sodium  05/24/2012 138  135 - 145 mEq/L Final  . Potassium 05/24/2012 4.5  3.5 - 5.1 mEq/L Final  . Chloride 05/24/2012 102  96 - 112 mEq/L Final  . CO2 05/24/2012 28  19 - 32 mEq/L Final  . Glucose, Bld 05/24/2012 103* 70 - 99 mg/dL Final  . BUN 14/78/2956 22  6 - 23 mg/dL Final  . Creatinine, Ser 05/24/2012 0.88  0.50 - 1.35 mg/dL Final  . Calcium 21/30/8657 10.1  8.4 - 10.5 mg/dL Final  . Total Protein 05/24/2012 8.0  6.0 - 8.3 g/dL Final  . Albumin 84/69/6295 4.0  3.5 - 5.2 g/dL Final  . AST 63/05/6008 23  0 - 37 U/L Final  . ALT 05/24/2012 30  0 - 53 U/L Final  . Alkaline Phosphatase 05/24/2012 58  39 - 117 U/L Final  . Total Bilirubin 05/24/2012 0.4  0.3 - 1.2 mg/dL Final  . GFR calc non Af Amer 05/24/2012 84* >90 mL/min Final  . GFR calc Af Amer 05/24/2012 >90  >90 mL/min Final   Comment:                                 The eGFR has been calculated                          using the CKD EPI equation.                          This calculation has not been                          validated in all clinical                          situations.                          eGFR's persistently                          <90 mL/min signify                          possible Chronic Kidney Disease.  [name] Prothrombin Time 05/24/2012 12.7  11.6 - 15.2 seconds Final  . INR 05/24/2012 0.96  0.00 - 1.49 Final  . Color, Urine 05/24/2012 YELLOW  YELLOW Final  . APPearance 05/24/2012 CLEAR  CLEAR Final  . Specific Gravity, Urine 05/24/2012 1.015  1.005 - 1.030 Final  . pH 05/24/2012 6.0  5.0 - 8.0 Final  . Glucose, UA 05/24/2012 NEGATIVE  NEGATIVE mg/dL Final  . Hgb urine dipstick 05/24/2012 NEGATIVE  NEGATIVE Final  . Bilirubin Urine 05/24/2012 NEGATIVE  NEGATIVE Final  . Ketones, ur 05/24/2012 NEGATIVE  NEGATIVE mg/dL Final  . Protein, ur 93/23/5573 NEGATIVE  NEGATIVE mg/dL Final  . Urobilinogen, UA 05/24/2012 0.2  0.0 - 1.0 mg/dL Final  . Nitrite 22/06/5425 NEGATIVE  NEGATIVE Final  . Leukocytes, UA  05/24/2012 NEGATIVE  NEGATIVE Final   MICROSCOPIC NOT DONE ON URINES WITH NEGATIVE PROTEIN, BLOOD, LEUKOCYTES, NITRITE, OR GLUCOSE <1000 mg/dL.     X-Rays:Dg Chest 2 View  05/24/2012  *RADIOLOGY REPORT*  Clinical Data: Preop right knee arthroplasty  CHEST - 2 VIEW  Comparison: 03/14/2011  Findings: Postop CABG.  Negative for heart failure.  Mild scarring in the left lung base is unchanged.  Negative for pneumonia.  No mass lesion identified.  IMPRESSION: Chronic scarring left lung base.  No acute abnormality.   Original Report Authenticated By: Janeece Riggers, M.D.     EKG: Orders placed during the hospital encounter of 06/01/12  . EKG     Hospital Course: JEREMYAH JELLEY is a 72 y.o. who was admitted to [facility]. They were brought to the operating room on 06/01/2012 and underwent Procedure(s): TOTAL KNEE ARTHROPLASTY.  Patient  tolerated the procedure well and was later transferred to the recovery room and then to the orthopaedic floor for postoperative care.  They were given PO and IV analgesics for pain control following their surgery.  They were given 24 hours of postoperative antibiotics of  Anti-infectives     Start     Dose/Rate Route Frequency Ordered Stop   06/01/12 1400   ceFAZolin (ANCEF) IVPB 1 g/50 mL premix        1 g 100 mL/hr over 30 Minutes Intravenous Every 6 hours 06/01/12 1125 06/01/12 2015   06/01/12 0600   ceFAZolin (ANCEF) IVPB 2 g/50 mL premix        2 g 100 mL/hr over 30 Minutes Intravenous 60 min pre-op 06/01/12 0517 06/01/12 0739         and started on DVT prophylaxis in the form of Xarelto.   PT and OT were ordered for total joint protocol.  Discharge planning consulted to help with postop disposition and equipment needs.  Patient had a decent night on the evening of surgery and started to get up OOB with therapy on day one. Hemovac drain was pulled without difficulty.  Continued to work with therapy into day two.  Dressing was changed on day two and the  incision was healing well.   Patient was seen in rounds and was ready to go home later that same day on POD 2.   Discharge Medications: Prior to Admission medications   Medication Sig Start Date End Date Taking? Authorizing Provider  atorvastatin (LIPITOR) 20 MG tablet Take 20 mg by mouth at bedtime.    Yes Historical Provider, MD  enalapril (VASOTEC) 20 MG tablet Take 20 mg by mouth at bedtime.    Yes Historical Provider, MD  levothyroxine (SYNTHROID, LEVOTHROID) 112 MCG tablet Take 112 mcg by mouth every morning.    Yes Historical Provider, MD  levothyroxine (SYNTHROID, LEVOTHROID) 125 MCG tablet Take 125 mcg by mouth. AM BEFORE BREAKFAST   Yes Historical Provider, MD  metoprolol succinate (TOPROL-XL) 25 MG 24 hr tablet Take 25 mg by mouth every morning.    Yes Historical Provider, MD  triamcinolone cream (KENALOG) 0.1 % Apply 1 application topically 2 (two) times daily as needed. apply to hand for dry skin   Yes Historical Provider, MD  methocarbamol (ROBAXIN) 500 MG tablet Take 1 tablet (500 mg total) by mouth every 6 (six) hours as needed. 06/02/12   Loanne Drilling, MD  oxyCODONE (OXY IR/ROXICODONE) 5 MG immediate release tablet Take 1-2 tablets (5-10 mg total) by mouth every 3 (three) hours as needed. 06/02/12   Loanne Drilling, MD  rivaroxaban (XARELTO) 10 MG TABS tablet Take 1 tablet (10 mg total) by mouth daily with breakfast. 06/02/12   Loanne Drilling, MD  traMADol (ULTRAM) 50 MG tablet Take 1-2 tablets (50-100 mg total) by mouth every 6 (six) hours as needed. 06/02/12   Loanne Drilling, MD    Diet: Cardiac diet Activity:WBAT Follow-up:in 2 weeks Disposition - Home Discharged Condition: good      Medication List     As of 06/07/2012 10:07 AM    STOP taking these medications         aspirin EC 81 MG tablet      Co Q-10 300 MG Caps      Fish Oil 1200 MG Caps      multivitamin with minerals Tabs      TAKE these medications  atorvastatin 20 MG tablet   Commonly  known as: LIPITOR   Take 20 mg by mouth at bedtime.      enalapril 20 MG tablet   Commonly known as: VASOTEC   Take 20 mg by mouth at bedtime.      levothyroxine 112 MCG tablet   Commonly known as: SYNTHROID, LEVOTHROID   Take 112 mcg by mouth every morning.      levothyroxine 125 MCG tablet   Commonly known as: SYNTHROID, LEVOTHROID   Take 125 mcg by mouth. AM BEFORE BREAKFAST      methocarbamol 500 MG tablet   Commonly known as: ROBAXIN   Take 1 tablet (500 mg total) by mouth every 6 (six) hours as needed.      metoprolol succinate 25 MG 24 hr tablet   Commonly known as: TOPROL-XL   Take 25 mg by mouth every morning.      oxyCODONE 5 MG immediate release tablet   Commonly known as: Oxy IR/ROXICODONE   Take 1-2 tablets (5-10 mg total) by mouth every 3 (three) hours as needed.      rivaroxaban 10 MG Tabs tablet   Commonly known as: XARELTO   Take 1 tablet (10 mg total) by mouth daily with breakfast.      traMADol 50 MG tablet   Commonly known as: ULTRAM   Take 1-2 tablets (50-100 mg total) by mouth every 6 (six) hours as needed.      triamcinolone cream 0.1 %   Commonly known as: KENALOG   Apply 1 application topically 2 (two) times daily as needed. apply to hand for dry skin           Follow-up Information    Follow up with Loanne Drilling, MD. Schedule an appointment as soon as possible for a visit on 06/14/2012. (Call [phone] Monday to schedule the appointment)    Contact information:   [address] [place] [address] [phone]          Signed: Patrica Duel 06/07/2012, 10:07 AM

## 2012-06-20 ENCOUNTER — Encounter: Payer: Medicare Other | Admitting: Physical Therapy

## 2012-06-22 ENCOUNTER — Encounter: Payer: Medicare Other | Admitting: Physical Therapy

## 2012-06-25 ENCOUNTER — Ambulatory Visit: Payer: Medicare Other | Attending: Orthopedic Surgery

## 2012-06-25 DIAGNOSIS — IMO0001 Reserved for inherently not codable concepts without codable children: Secondary | ICD-10-CM | POA: Insufficient documentation

## 2012-06-25 DIAGNOSIS — Z96659 Presence of unspecified artificial knee joint: Secondary | ICD-10-CM | POA: Insufficient documentation

## 2012-06-25 DIAGNOSIS — M25569 Pain in unspecified knee: Secondary | ICD-10-CM | POA: Insufficient documentation

## 2012-06-25 DIAGNOSIS — M6281 Muscle weakness (generalized): Secondary | ICD-10-CM | POA: Insufficient documentation

## 2012-06-25 DIAGNOSIS — M25669 Stiffness of unspecified knee, not elsewhere classified: Secondary | ICD-10-CM | POA: Insufficient documentation

## 2012-06-25 DIAGNOSIS — R269 Unspecified abnormalities of gait and mobility: Secondary | ICD-10-CM | POA: Insufficient documentation

## 2012-06-27 ENCOUNTER — Ambulatory Visit: Payer: Medicare Other | Admitting: Physical Therapy

## 2012-06-29 ENCOUNTER — Ambulatory Visit: Payer: Medicare Other | Admitting: Physical Therapy

## 2012-07-02 ENCOUNTER — Ambulatory Visit: Payer: Medicare Other | Admitting: Physical Therapy

## 2012-07-04 ENCOUNTER — Ambulatory Visit: Payer: Medicare Other

## 2012-07-06 ENCOUNTER — Encounter: Payer: Medicare Other | Admitting: Physical Therapy

## 2012-07-09 ENCOUNTER — Ambulatory Visit: Payer: Medicare Other | Attending: Orthopedic Surgery | Admitting: Physical Therapy

## 2012-07-09 DIAGNOSIS — M25569 Pain in unspecified knee: Secondary | ICD-10-CM | POA: Insufficient documentation

## 2012-07-09 DIAGNOSIS — M25669 Stiffness of unspecified knee, not elsewhere classified: Secondary | ICD-10-CM | POA: Insufficient documentation

## 2012-07-09 DIAGNOSIS — R269 Unspecified abnormalities of gait and mobility: Secondary | ICD-10-CM | POA: Insufficient documentation

## 2012-07-09 DIAGNOSIS — M6281 Muscle weakness (generalized): Secondary | ICD-10-CM | POA: Insufficient documentation

## 2012-07-09 DIAGNOSIS — IMO0001 Reserved for inherently not codable concepts without codable children: Secondary | ICD-10-CM | POA: Insufficient documentation

## 2012-07-11 ENCOUNTER — Ambulatory Visit: Payer: Medicare Other

## 2012-07-13 ENCOUNTER — Ambulatory Visit: Payer: Medicare Other | Admitting: Physical Therapy

## 2012-07-16 ENCOUNTER — Ambulatory Visit: Payer: Medicare Other | Admitting: Physical Therapy

## 2012-07-18 ENCOUNTER — Ambulatory Visit: Payer: Medicare Other

## 2012-07-20 ENCOUNTER — Ambulatory Visit: Payer: Medicare Other | Admitting: Physical Therapy

## 2012-07-23 ENCOUNTER — Ambulatory Visit: Payer: Medicare Other | Admitting: Physical Therapy

## 2012-07-27 ENCOUNTER — Ambulatory Visit: Payer: Medicare Other | Admitting: Physical Therapy

## 2012-07-30 ENCOUNTER — Ambulatory Visit: Payer: Medicare Other

## 2012-08-01 ENCOUNTER — Ambulatory Visit: Payer: Medicare Other | Admitting: Physical Therapy

## 2012-08-03 ENCOUNTER — Encounter: Payer: Medicare Other | Admitting: Physical Therapy

## 2012-08-06 ENCOUNTER — Encounter: Payer: Medicare Other | Admitting: Physical Therapy

## 2012-08-08 ENCOUNTER — Ambulatory Visit: Payer: Medicare Other | Attending: Orthopedic Surgery

## 2012-08-08 DIAGNOSIS — M25669 Stiffness of unspecified knee, not elsewhere classified: Secondary | ICD-10-CM | POA: Insufficient documentation

## 2012-08-08 DIAGNOSIS — M6281 Muscle weakness (generalized): Secondary | ICD-10-CM | POA: Insufficient documentation

## 2012-08-08 DIAGNOSIS — R269 Unspecified abnormalities of gait and mobility: Secondary | ICD-10-CM | POA: Insufficient documentation

## 2012-08-08 DIAGNOSIS — M25569 Pain in unspecified knee: Secondary | ICD-10-CM | POA: Insufficient documentation

## 2012-08-08 DIAGNOSIS — IMO0001 Reserved for inherently not codable concepts without codable children: Secondary | ICD-10-CM | POA: Insufficient documentation

## 2012-09-25 ENCOUNTER — Telehealth: Payer: Self-pay | Admitting: *Deleted

## 2012-09-25 NOTE — Telephone Encounter (Signed)
Received lab results from Dr Tor Netters office. Dr Herbie Baltimore  Reviewed labs. Results given. No change in medication. (CMP,LIPID)

## 2012-10-04 ENCOUNTER — Encounter: Payer: Self-pay | Admitting: Cardiology

## 2013-01-03 ENCOUNTER — Encounter: Payer: Self-pay | Admitting: Cardiology

## 2013-01-03 ENCOUNTER — Ambulatory Visit (INDEPENDENT_AMBULATORY_CARE_PROVIDER_SITE_OTHER): Payer: Medicare Other | Admitting: Cardiology

## 2013-01-03 VITALS — BP 138/72 | HR 57 | Ht 68.0 in | Wt 169.2 lb

## 2013-01-03 DIAGNOSIS — I2510 Atherosclerotic heart disease of native coronary artery without angina pectoris: Secondary | ICD-10-CM

## 2013-01-03 DIAGNOSIS — G4730 Sleep apnea, unspecified: Secondary | ICD-10-CM

## 2013-01-03 DIAGNOSIS — E785 Hyperlipidemia, unspecified: Secondary | ICD-10-CM | POA: Insufficient documentation

## 2013-01-03 DIAGNOSIS — E039 Hypothyroidism, unspecified: Secondary | ICD-10-CM

## 2013-01-03 NOTE — Patient Instructions (Addendum)
Please schedule a follow-up appointment in: 6 months with Dr. Herbie Baltimore. You will receive a reminder letter in the mail two months in advance. If you don't receive a letter, please call our office to schedule the follow-up appointment.

## 2013-01-03 NOTE — Assessment & Plan Note (Signed)
Unable to tolerate cpap

## 2013-01-03 NOTE — Assessment & Plan Note (Signed)
Followed by PCP, last LDL in 09/2012= 66, HDL 47, TG 56, T Chol 124, normal LFTs.

## 2013-01-03 NOTE — Progress Notes (Signed)
Patient ID: [name] [name], male   DOB: 12/05/1940, 72 y.o.   MRN: [id number]       01/03/2013   PCP: Lupita Raider, MD   Chief Complaint  Patient presents with  . 6 months    no complaints    Primary Cardiologist: Dr. Ranae Palms   HPI:  72 year old white married male, previous patient of Dr. Gaspar Garbe and now Dr. Bryan Lemma here today for follow up of coronary artery disease.  His history dates back to at least 1990 when he had angioplasty.  He underwent bypass grafting 05/17/2000 with LIMA to the LAD reverse vein graft to the first obtuse marginal and reverse vein graft to the posterior descending coronary artery.  In 2011 he underwent stress test which was mildly positive he then underwent cardiac catheterization with patent grafts, EF of 60%, a 99% mid circumflex with retrograde filling of the OM circumflex graft and 70% narrowing in a small caliber vessel the circumflex distal which Dr. Clarene Duke felt was the reason for his abnormal nuc study.  Since that time patient has done very well. He's undergone total knee replacement within the last year.  Patient's lipid panel is stable followed by his primary care Dr. Philipp Deputy. He has a history of sleep apnea but unable to tolerate his CPAP. Today he has no chest pain no shortness of breath and really no complaints at all.   Allergies  Allergen Reactions  . Contrast Media [Iodinated Diagnostic Agents] Other (See Comments)    Unknown   . Crestor [Rosuvastatin]     SEVERE LEG CRAMPS  . Sulfa Antibiotics     REACTION WAS YRS AGO - SWELLING, SKIN REDNESS  . Bactrim [Sulfamethoxazole-Tmp Ds] Rash  . Doxycycline Rash    Current Outpatient Prescriptions  Medication Sig Dispense Refill  . aspirin 81 MG tablet Take 81 mg by mouth daily.      [name] atorvastatin (LIPITOR) 20 MG tablet Take 20 mg by mouth at bedtime.       . Coenzyme Q10 (CO Q 10 PO) Take by mouth.      . enalapril (VASOTEC) 20 MG tablet Take 20 mg by mouth at bedtime.       [name]  levothyroxine (SYNTHROID, LEVOTHROID) 125 MCG tablet Take 125 mcg by mouth. AM BEFORE BREAKFAST      . metoprolol succinate (TOPROL-XL) 25 MG 24 hr tablet Take 25 mg by mouth every morning.       . Multiple Vitamin (MULTIVITAMIN) tablet Take 1 tablet by mouth daily.      . Omega-3 Fatty Acids (FISH OIL PO) Take by mouth.      . triamcinolone cream (KENALOG) 0.1 % Apply 1 application topically 2 (two) times daily as needed. apply to hand for dry skin       No current facility-administered medications for this visit.    Past Medical History  Diagnosis Date  . Coronary artery disease     NUC, 01/20/2010 - mild ischemia in basal inerolateral, mid inferolateral, and apical regions, post-stress EF 61%, EKG negative for ischemia, low-risk scan; Arterial Evaluation, 03/28/2000 - normal-mild eval  . Hypertension   . Sleep apnea     AHI-18.3/hr, AHI REM-3.1/hr UNABLE TO ADJUST TO USING C-PAP  . Hypothyroidism   . Hematuria     OCASIONAL HEMATURIA UNK CAUSE  . Arthritis   . PONV (postoperative nausea and vomiting)     Past Surgical History  Procedure Laterality Date  . Coronary artery bypass graft  3 VESSELS 2002  . Cholecystectomy      1997  . Total knee arthroplasty  03/21/2011    Procedure: TOTAL KNEE ARTHROPLASTY;  Surgeon: Loanne Drilling;  Location: [facility];  Service: Orthopedics;  Laterality: Left;  . Joint replacement    . Total knee arthroplasty  06/01/2012    Procedure: TOTAL KNEE ARTHROPLASTY;  Surgeon: Loanne Drilling, MD;  Location: [facility];  Service: Orthopedics;  Laterality: Right;  . Cardiac catheterization  03/17/2010    Recommended medial management  . Cardiac catheterization  03/28/2000    Recommended CABG  . Cardiac catheterization  12/02/1988    Proximal RCA 95% stenosis inflated with a 2.76mm balloo, exchanged for a 3 Piccolino balloon resulting in a 20-30% irregularity; Distal RCA 90% stenosis inflated with a 2.5 balloon results were normal  . Cardiac catheterization   10/21/1988    Severe disease in Mid and Distal RCA. Mid portion-90%, distal portion-80%  . Cardiac catheterization  08/04/1988    Distal RCA 95% occluded, inflated with a 2.5 Monorel balloon. There is a 30% irregularity    EAV:WUJWJXB:JY colds or fevers, no weight changes Skin:no rashes or ulcers HEENT:no blurred vision, no congestion CV:see HPI PUL:see HPI GI:no diarrhea constipation or melena, no indigestion GU:no hematuria, no dysuria MS:no joint pain, no claudication Neuro:no syncope, no lightheadedness Endo:no diabetes, no thyroid disease  Patient exercises 3 times a week at the gym he also plays golf and walks the golf course. His wife ensures that he eats a healthy diet.  PHYSICAL EXAM BP 138/72  Pulse 57  Ht 5\' 8"  (1.727 m)  Wt 76.749 kg (169 lb 3.2 oz)  BMI 25.73 kg/m2 General:Pleasant affect, NAD Skin:Warm and dry, brisk capillary refill HEENT:normocephalic, sclera clear, mucus membranes moist Neck:supple, no JVD, no bruits  Heart:S1S2 RRR without murmur, gallup, rub or click Lungs:clear without rales, rhonchi, or wheezes NWG:NFAO, non tender, + BS, do not palpate liver spleen or masses Ext:no lower ext edema, 2+ pedal pulses, 2+ radial pulses Neuro:alert and oriented, MAE, follows commands, + facial symmetry  EKG: Sinus bradycardia rate of 57 right bundle branch block left axis deviation EKG is grossly abnormal but it has looked the same way for several years. Previously he has PVCs no ectopy on this EKG.   ASSESSMENT AND PLAN Coronary artery disease Stable no chest pain or SOB.  EKG stable with RBBB, LAFB.  TO FOLLOW UP WITH Dr. Herbie Baltimore in 6 months unless problems before then.  Last nuc in 2011, last cath 2011 with patent grafts- though there is non grafted segment of distal LCX WITH 70% NARROWING FELT TO BE REASON FOR ABNORMAL STRESS TEST IN 2011, HX CABG 2002   Hyperlipidemia LDL goal < 70 Followed by PCP, last LDL in 09/2012= 66, HDL 47, TG 56, T Chol 124, normal  LFTs.  Sleep apnea Unable to tolerate cpap

## 2013-01-03 NOTE — Assessment & Plan Note (Signed)
Stable no chest pain or SOB.  EKG stable with RBBB, LAFB.  TO FOLLOW UP WITH Dr. Herbie Baltimore in 6 months unless problems before then.  Last nuc in 2011, last cath 2011 with patent grafts- though there is non grafted segment of distal LCX WITH 70% NARROWING FELT TO BE REASON FOR ABNORMAL STRESS TEST IN 2011, HX CABG 2002

## 2013-02-08 ENCOUNTER — Other Ambulatory Visit: Payer: Self-pay | Admitting: *Deleted

## 2013-02-08 MED ORDER — ATORVASTATIN CALCIUM 20 MG PO TABS: 20.0000 mg | ORAL_TABLET | Freq: Every day | ORAL | Status: DC

## 2013-02-08 MED ORDER — METOPROLOL SUCCINATE ER 25 MG PO TB24: 25.0000 mg | ORAL_TABLET | ORAL | Status: DC

## 2013-02-08 NOTE — Telephone Encounter (Signed)
Rx was sent to pharmacy electronically. 

## 2013-02-15 ENCOUNTER — Telehealth: Payer: Self-pay | Admitting: Cardiology

## 2013-02-15 MED ORDER — METOPROLOL SUCCINATE ER 25 MG PO TB24: 25.0000 mg | ORAL_TABLET | ORAL | Status: DC

## 2013-02-15 MED ORDER — ENALAPRIL MALEATE 20 MG PO TABS: 20.0000 mg | ORAL_TABLET | Freq: Every day | ORAL | Status: DC

## 2013-02-15 MED ORDER — ATORVASTATIN CALCIUM 20 MG PO TABS: 20.0000 mg | ORAL_TABLET | Freq: Every day | ORAL | Status: DC

## 2013-02-15 NOTE — Telephone Encounter (Signed)
Patient said [facility] has not received approval on his rx's  Looks like was called to [facility] by error  Says he needs for meds approved on 10/03 to go thru [facility].

## 2013-02-15 NOTE — Telephone Encounter (Signed)
Resent patient's prescriptions to [facility] per his request.

## 2013-02-19 ENCOUNTER — Other Ambulatory Visit: Payer: Self-pay | Admitting: *Deleted

## 2013-02-20 ENCOUNTER — Telehealth: Payer: Self-pay | Admitting: Cardiology

## 2013-02-20 IMAGING — CR DG CHEST 2V
2 series · 2 of 2 positions shown · non-contrast
Comparison: Chest radiograph 03/11/2010

CLINICAL DATA: Preop knee replacement

CHEST - 2 VIEW

[w chest pa]
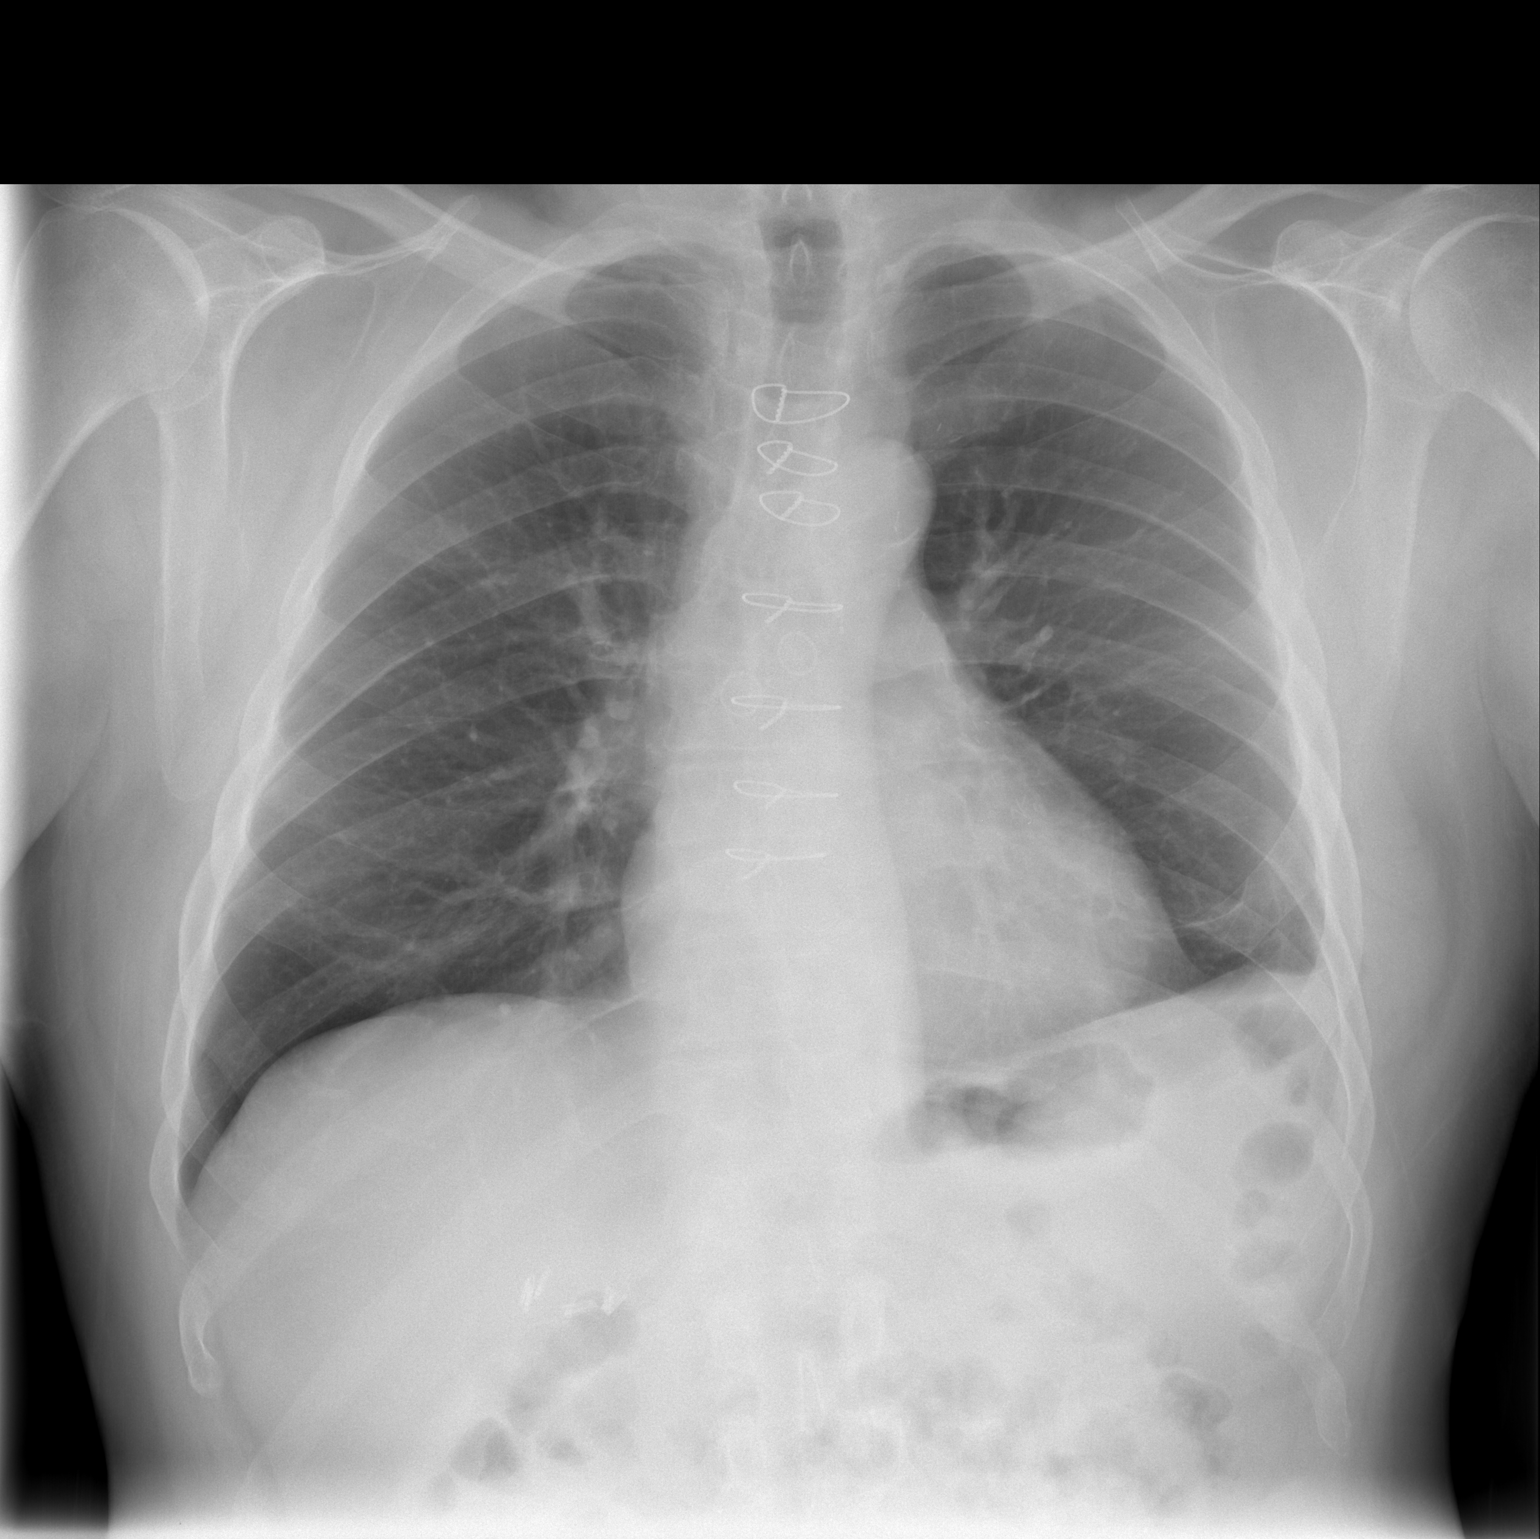

[w chest lat]
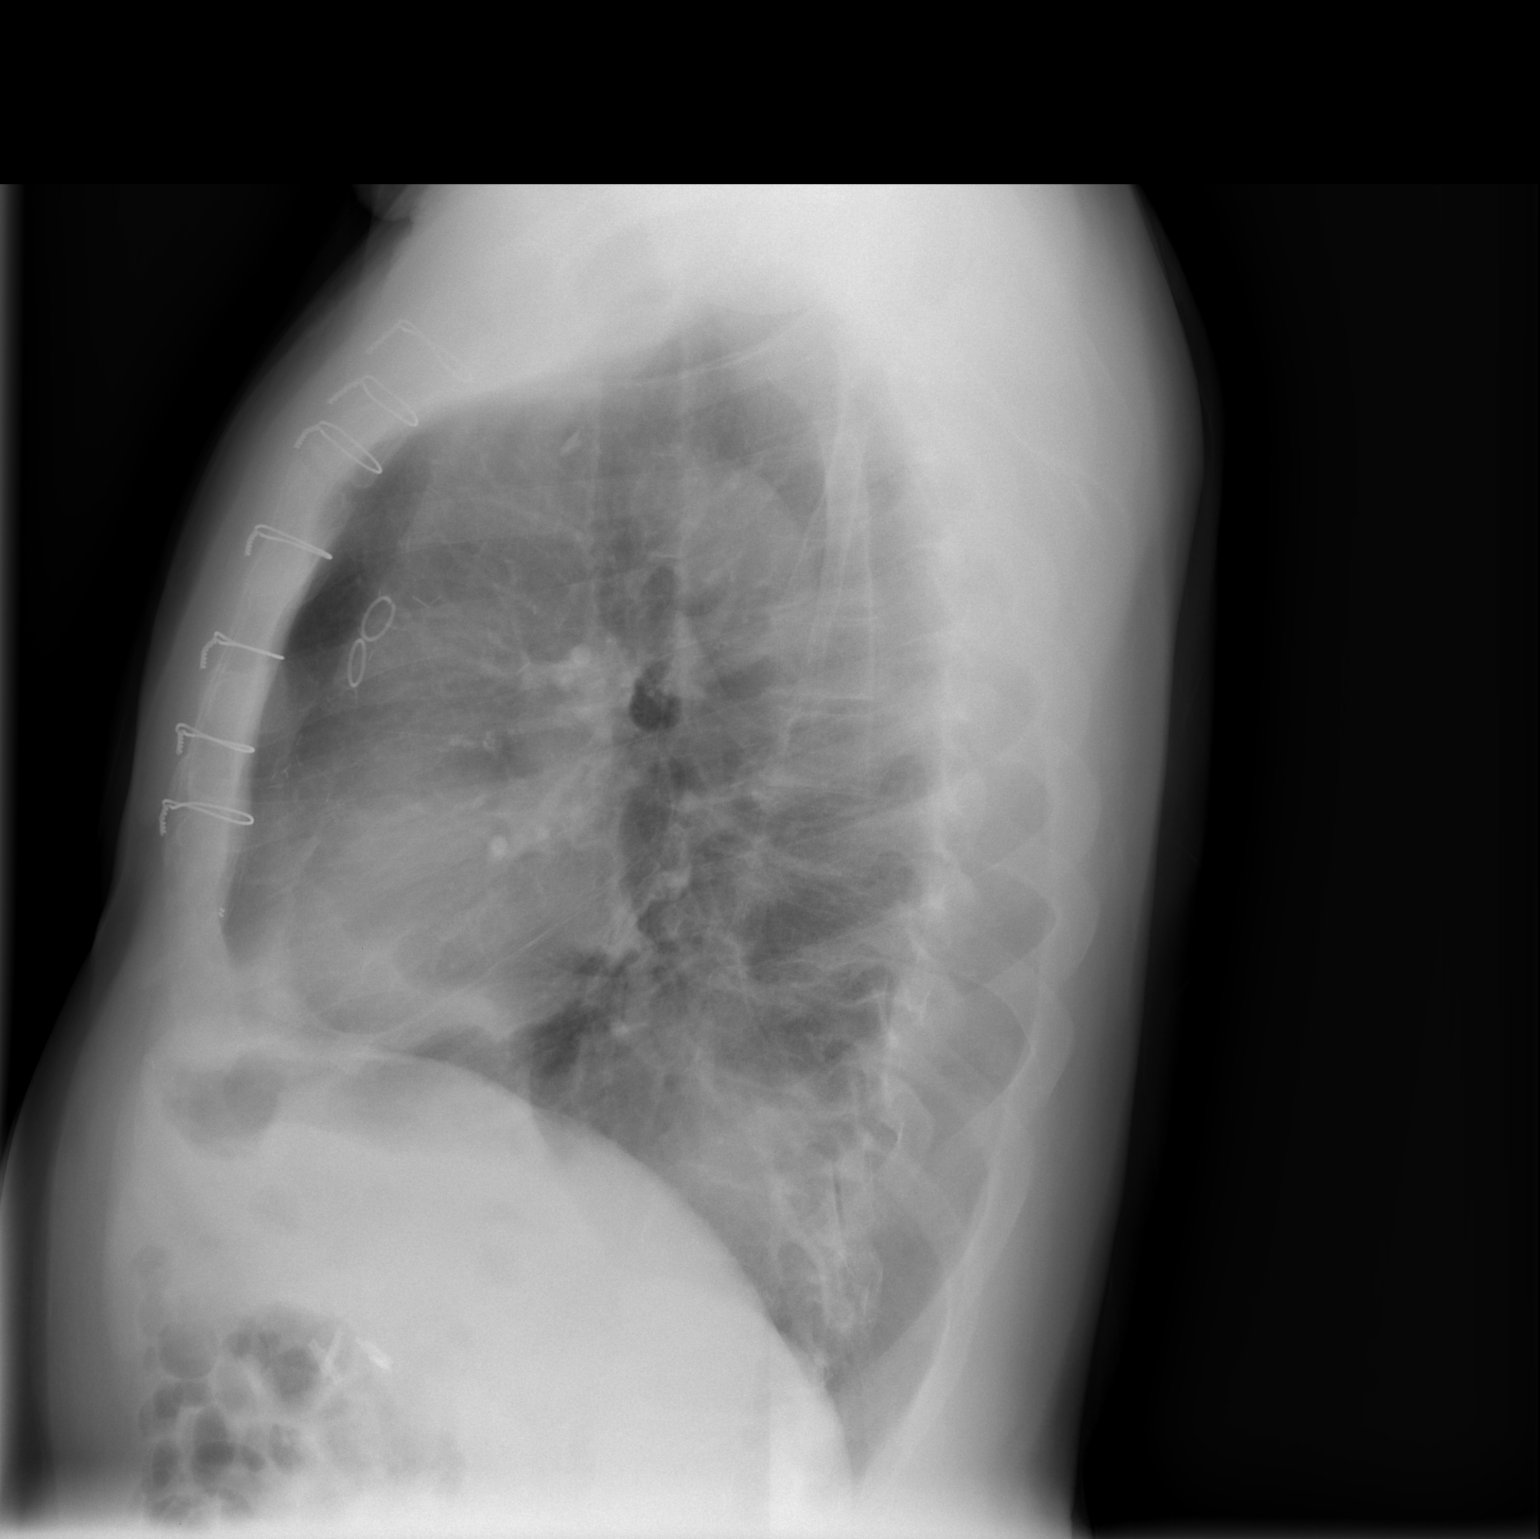

[2 of 2 positions shown; findings below may reference images not displayed]

FINDINGS: Sternotomy wires overlie normal cardiac silhouette.
There is scarring at the left lung base which is similar to prior.
No effusion, infiltrate, or pneumothorax. Cholecystectomy clips.
IMPRESSION: 1.  No acute cardiopulmonary process.
2. Post CABG and cholecystectomy

## 2013-02-20 MED ORDER — ATORVASTATIN CALCIUM 20 MG PO TABS: 20.0000 mg | ORAL_TABLET | Freq: Every day | ORAL | Status: DC

## 2013-02-20 MED ORDER — METOPROLOL SUCCINATE ER 25 MG PO TB24: 25.0000 mg | ORAL_TABLET | ORAL | Status: DC

## 2013-02-20 NOTE — Telephone Encounter (Signed)
Rx was sent to pharmacy electronically. 

## 2013-02-20 NOTE — Telephone Encounter (Signed)
Need refill on Metoprolol 25 mg # 90 and Atorvastatin 20 mg #90

## 2013-03-04 ENCOUNTER — Ambulatory Visit (INDEPENDENT_AMBULATORY_CARE_PROVIDER_SITE_OTHER): Payer: Medicare Other

## 2013-03-04 DIAGNOSIS — Z23 Encounter for immunization: Secondary | ICD-10-CM

## 2013-03-18 ENCOUNTER — Encounter: Payer: Self-pay | Admitting: Cardiology

## 2013-07-02 ENCOUNTER — Encounter: Payer: Self-pay | Admitting: Cardiology

## 2013-07-02 ENCOUNTER — Ambulatory Visit (INDEPENDENT_AMBULATORY_CARE_PROVIDER_SITE_OTHER): Payer: Medicare HMO | Admitting: Cardiology

## 2013-07-02 VITALS — BP 150/96 | HR 84 | Ht 69.0 in | Wt 180.4 lb

## 2013-07-02 DIAGNOSIS — I2510 Atherosclerotic heart disease of native coronary artery without angina pectoris: Secondary | ICD-10-CM

## 2013-07-02 DIAGNOSIS — I10 Essential (primary) hypertension: Secondary | ICD-10-CM

## 2013-07-02 DIAGNOSIS — E785 Hyperlipidemia, unspecified: Secondary | ICD-10-CM

## 2013-07-02 NOTE — Patient Instructions (Signed)
Your seem to be doing well.   Your Blood Pressure is a bit high today -- I will ask your PCP to keep an eye on it.   As long as you are doing well, we will leave medications alone.  We can discuss rechecking a follow-up stress test at our next visit in ~ 1 year.    We will continue with the alternating 6 month visits - between Mickel Baas & myself.  Leonie Man, MD  Your physician wants you to follow-up in: 6 months with Cecilie Kicks, NP.   You will receive a reminder letter in the mail two months in advance. If you don't receive a letter, please call our office to schedule the follow-up appointment.

## 2013-07-04 ENCOUNTER — Encounter: Payer: Self-pay | Admitting: Cardiology

## 2013-07-04 DIAGNOSIS — I10 Essential (primary) hypertension: Secondary | ICD-10-CM | POA: Insufficient documentation

## 2013-07-04 NOTE — Assessment & Plan Note (Signed)
Followed by PCP. He was essentially a goal back in May of 2014. He is on a stable dose of atorvastatin.

## 2013-07-04 NOTE — Assessment & Plan Note (Signed)
He tends to have a white coat phenomenon. He tells me at home his blood pressures range from 017-510 mmHg systolic with 25E to 52D mmHg diastolic. He is on a good dose of ACE inhibitor as well as beta blocker. If he continues to have a mildly elevated blood pressure in the future, my recommendation would be to increase the Toprol dose to 50 mg.

## 2013-07-04 NOTE — Assessment & Plan Note (Addendum)
He continued to be stable with routine exercise. No angina or heart failure symptoms. I saw he is on the appropriate regimen including: aspirin, statin, ACE inhibitor and beta blocker. He is close to being due for another followup Myoview which we can order at his next followup appointment with Cecilie Kicks, NP  in 6 months.

## 2013-07-04 NOTE — Progress Notes (Signed)
PATIENT: [name] [name] MRN: [id number]  DOB: November 11, 1940   DOV:07/04/2013 PCP: Mayra Neer, MD  Clinic Note: Chief Complaint  Patient presents with  . Follow-up    6 months:  No chest pain, SOB, edema or dizziness.  Home BP's run 130's/80's.    HPI: [name] [name] is a 73 y.o.  male with a PMH below who presents today for annual followup of his CAD. He is a long-standing history dating back to 5s when he had PTCA of the RCA on several occasions. Finally, in November 2001 he underwent catheterization for unstable angina this showed multivessel CAD. This led to him undergoing CABG in early 2002. His last catheterization was in November 2011 done for a mildly abnormal Myoview stress test. I last saw him in February of last year. He was recovering from his second knee surgery at that time. He followed up 6 months ago with Cecilie Kicks, NP [facility] alternating followup plan with NP and M.D.  Interval History: He now presents for routine followup doing quite well but any major complaints. His back to working out in the gym 3 days a week. He is having no sensation of chest tightness or pressure with rest or exertion. No dyspnea with rest or exertion. With his exercise he has started to build back the muscle mass that he had prior to his surgeries, and therefore put all of the weight. He denies any PND, orthopnea or edema to suggest heart failure. No lightheadedness, dizziness, wooziness or syncope/near syncope. No palpitations or rapid heart beats. No TIA or amaurosis fugax symptoms. No melena, hematochezia or hematuria. No complications.  Past Medical History  Diagnosis Date  . CAD in native artery 1990    Status post PCI to the RCA --> CABG in 2002:  . S/P CABG x 3 2002    LIMA-LAD, SVG-PDA, SVG-OM.  [name] Hypertension   . Hypothyroidism   . Hematuria     OCASIONAL HEMATURIA UNK CAUSE  . Arthritis   . PONV (postoperative nausea and vomiting)   . OSA on CPAP     AHI-18.3/hr, AHI REM-3.1/hr  UNABLE TO ADJUST TO USING C-PAP  . Dyslipidemia, goal LDL below 70      on statin    Prior Cardiac Evaluation and Past Surgical History: Past Surgical History  Procedure Laterality Date  . Coronary artery bypass graft  Jan 2002    3 VESSELS 2002; LIMA-LAD, SVG-OM, SVG to RPDA.  [name] Cholecystectomy      1997  . Total knee arthroplasty  03/21/2011    Procedure: TOTAL KNEE ARTHROPLASTY;  Surgeon: Gearlean Alf;  Location: [facility];  Service: Orthopedics;  Laterality: Left;  . Joint replacement    . Total knee arthroplasty  06/01/2012    Procedure: TOTAL KNEE ARTHROPLASTY;  Surgeon: Gearlean Alf, MD;  Location: [facility];  Service: Orthopedics;  Laterality: Right;  . Cardiac catheterization  03/17/2010    Patent LIMA-LAD, small non-intervenable D1 with ostial 70% stenosis; patent SVG-OM with retrograde flow to the native circumflex & 90% stenosis in main Cx. 60-70% lesion in AVG Cx. Patent SVG-RPDA with retrograde flow filling the PL system.  . Cardiac catheterization  03/28/2000    Recommended CABG --> CABG in Jan 2002  . Coronary angioplasty  12/02/1988    Proximal RCA 95% stenosis -- PTCA inflated with a 2.4mm balloon --> 3.0 mm Piccolino balloon resulting in a 20-30% irregularity; Distal RCA 90% stenosis --> 2.5 balloon results were normal  . Cardiac catheterization  10/21/1988    Severe disease in Mid and Distal RCA. Mid portion-90%, distal portion-80%  . Coronary angioplasty  08/04/1988    Distal RCA 95% occluded -> PTCA w/ 2.5 mm balloon -->.  30% irregularity  . Nm myoview ltd  September 2011    mild ischemia in basal inerolateral, mid inferolateral, and apical regions, post-stress EF 61%, Low-Risk Scan;    Allergies  Allergen Reactions  . Contrast Media [Iodinated Diagnostic Agents] Other (See Comments)    Unknown   . Crestor [Rosuvastatin]     SEVERE LEG CRAMPS  . Sulfa Antibiotics     REACTION WAS YRS AGO - SWELLING, SKIN REDNESS  . Bactrim [Sulfamethoxazole-Tmp Ds] Rash  .  Doxycycline Rash    Current Outpatient Prescriptions  Medication Sig Dispense Refill  . aspirin 81 MG tablet Take 81 mg by mouth daily.      [name] atorvastatin (LIPITOR) 20 MG tablet Take 1 tablet (20 mg total) by mouth at bedtime.  90 tablet  3  . betamethasone dipropionate (DIPROLENE) 0.05 % ointment Apply topically daily.       . Coenzyme Q10 (CO Q 10 PO) Take by mouth.      . enalapril (VASOTEC) 20 MG tablet Take 1 tablet (20 mg total) by mouth at bedtime.  90 tablet  3  . levothyroxine (SYNTHROID, LEVOTHROID) 125 MCG tablet Take 125 mcg by mouth. AM BEFORE BREAKFAST      . metoprolol succinate (TOPROL-XL) 25 MG 24 hr tablet Take 1 tablet (25 mg total) by mouth every morning.  90 tablet  3  . Multiple Vitamin (MULTIVITAMIN) tablet Take 1 tablet by mouth daily.      . Omega-3 Fatty Acids (FISH OIL PO) Take by mouth.      . triamcinolone cream (KENALOG) 0.1 % Apply 1 application topically 2 (two) times daily as needed. apply to hand for dry skin       No current facility-administered medications for this visit.    History   Social History Narrative   Married father of one, grandfather of 2. Now working out at the gym 3 days a week. Does not smoke and does not drink.    ROS: A comprehensive Review of Systems - Negative except Mild discomfort in his knees and hips.  PHYSICAL EXAM BP 150/96  Pulse 84  Ht 5\' 9"  (1.753 m)  Wt 180 lb 6.4 oz (81.829 kg)  BMI 26.63 kg/m2 General appearance: alert, cooperative, appears stated age, no distress and Well-nourished and well-groomed. Neck: no adenopathy, no carotid bruit, no JVD and supple, symmetrical, trachea midline Lungs: clear to auscultation bilaterally, normal percussion bilaterally and Nonlabored, good air movement Heart: no R./G. There is a very soft 1/6 early peaking crescendo-decrescendo HSM at apex. Nondisplaced PMI. Abdomen: soft, non-tender; bowel sounds normal; no masses,  no organomegaly Extremities: extremities normal,  atraumatic, no cyanosis or edema, no edema, redness or tenderness in the calves or thighs and no ulcers, gangrene or trophic changes Pulses: 2+ and symmetric Neurologic: Alert and oriented X 3, normal strength and tone. Normal symmetric reflexes. Normal coordination and gait  [id number] today: Yes Rate: 84 , Rhythm: NSR with 1 PVC. RBBB, LVH, LAD  --no significant change  Recent Labs: No recent labs  ASSESSMENT / PLAN: CAD in native artery -- status post CABG x3 (LIMA-LAD, SVG RPDA, SVG-OM) He continued to be stable with routine exercise. No angina or heart failure symptoms. I saw he is on the appropriate regimen including: aspirin, statin, ACE  inhibitor and beta blocker. He is close to being due for another followup Myoview which we can order at his next followup appointment with Cecilie Kicks, NP  in 6 months.  Hyperlipidemia LDL goal < 70 Followed by PCP. He was essentially a goal back in May of 2014. He is on a stable dose of atorvastatin.  Essential hypertension He tends to have a white coat phenomenon. He tells me at home his blood pressures range from 0000000 mmHg systolic with [id number] to [id number] mmHg diastolic. He is on a good dose of ACE inhibitor as well as beta blocker. If he continues to have a mildly elevated blood pressure in the future, my recommendation would be to increase the Toprol dose to 50 mg.    Orders Placed This Encounter  Procedures  . EKG 12-Lead    Followup: 6 months with Cecilie Kicks, NP, one year with me  DAVID W. Ellyn Hack, M.D., M.S. Interventional Cardiology [facility]

## 2014-01-06 ENCOUNTER — Ambulatory Visit (INDEPENDENT_AMBULATORY_CARE_PROVIDER_SITE_OTHER): Payer: Medicare HMO | Admitting: Cardiology

## 2014-01-06 ENCOUNTER — Encounter: Payer: Self-pay | Admitting: Cardiology

## 2014-01-06 VITALS — BP 160/76 | HR 76 | Ht 69.0 in | Wt 173.6 lb

## 2014-01-06 DIAGNOSIS — G4730 Sleep apnea, unspecified: Secondary | ICD-10-CM

## 2014-01-06 DIAGNOSIS — I10 Essential (primary) hypertension: Secondary | ICD-10-CM

## 2014-01-06 DIAGNOSIS — I2510 Atherosclerotic heart disease of native coronary artery without angina pectoris: Secondary | ICD-10-CM

## 2014-01-06 DIAGNOSIS — R002 Palpitations: Secondary | ICD-10-CM

## 2014-01-06 DIAGNOSIS — E785 Hyperlipidemia, unspecified: Secondary | ICD-10-CM

## 2014-01-06 MED ORDER — METOPROLOL SUCCINATE ER 25 MG PO TB24: 25.0000 mg | ORAL_TABLET | ORAL | Status: DC

## 2014-01-06 MED ORDER — ATORVASTATIN CALCIUM 20 MG PO TABS: 20.0000 mg | ORAL_TABLET | Freq: Every day | ORAL | Status: DC

## 2014-01-06 MED ORDER — ENALAPRIL MALEATE 20 MG PO TABS: 20.0000 mg | ORAL_TABLET | Freq: Every day | ORAL | Status: DC

## 2014-01-06 NOTE — Patient Instructions (Addendum)
If you have the irregular heart rate , give our office a call we may have you a wear a monitor  Continue with current  Medications.   Your physician wants you to follow-up in 6 month Dr Ellyn Hack. You will receive a reminder letter in the mail two months in advance. If you don't receive a letter, please call our office to schedule the follow-up appointment.  6

## 2014-01-08 ENCOUNTER — Encounter: Payer: Self-pay | Admitting: Cardiology

## 2014-01-08 NOTE — Progress Notes (Signed)
PCP: Mayra Neer, MD  Clinic Note: Chief Complaint  Patient presents with  . 6 month visit    no chest pain,no sob, no edema    HPI: [name] [name] is a 73 y.o. male with a Cardiovascular Problem List below who presents today for a six-month followup of his CAD. Dating back to 83.  Interval History:  He is doing relatively well overall since his last visit. He states that he had one episode about a month ago where he had roughly 5 minutes of sensation of rapid heartbeat. Otherwise no real cardiac symptoms of chest tightness or pressure/dyspnea with rest or exertion. He says his blood pressures always run routinely in the 130/80 range but are always high in doctor's office. He denies any heart failure symptoms of PND, orthopnea or edema. He is most limited by knee pain with walking and that is actually improved since his knee surgeries.  he denies any presyncopal or syncopal type symptoms, TIA/amaurosis fugax symptoms. Even with a rapid heart rate episode he did not have any significant symptoms besides the sensation of rapid heartbeat. He denies any melena, hematochezia or hematuria. No myalgias.  No claudication.  He never did get comfortable using CPAP. He tried several different configurations and simply just could not tolerate. He has been trying to increase his Actos size and activity but is not doing routine exercise.  Past Medical History  Diagnosis Date  . CAD in native artery 1990    Status post PCI to the RCA --> CABG in 2002:  . S/P CABG x 3 2002    LIMA-LAD, SVG-PDA, SVG-OM.  [name] Hypertension   . Hypothyroidism   . Hematuria     OCASIONAL HEMATURIA UNK CAUSE  . Arthritis   . PONV (postoperative nausea and vomiting)   . OSA (obstructive sleep apnea)     AHI-18.3/hr, AHI REM-3.1/hr UNABLE TO ADJUST TO USING C-PAP  . Dyslipidemia, goal LDL below 70      on statin    Prior Cardiac Evaluation History: Procedure Laterality Date  . Coronary artery bypass graft  Jan  2002    3 VESSELS 2002; LIMA-LAD, SVG-OM, SVG to RPDA.  . Cardiac catheterization  03/17/2010    Patent LIMA-LAD, small non-intervenable D1 with ostial 70% stenosis; patent SVG-OM with retrograde flow to the native circumflex & 90% stenosis in main Cx. 60-70% lesion in AVG Cx. Patent SVG-RPDA with retrograde flow filling the PL system.  . Cardiac catheterization  03/28/2000    Recommended CABG --> CABG in Jan 2002  . Coronary angioplasty  12/02/1988    Proximal RCA 95% stenosis -- PTCA inflated with a 2.82mm balloon --> 3.0 mm Piccolino balloon resulting in a 20-30% irregularity; Distal RCA 90% stenosis --> 2.5 balloon results were normal  . Cardiac catheterization  10/21/1988    Severe disease in Mid and Distal RCA. Mid portion-90%, distal portion-80%  . Coronary angioplasty  08/04/1988    Distal RCA 95% occluded -> PTCA w/ 2.5 mm balloon -->.  30% irregularity  . Nm myoview ltd  September 2011    mild ischemia in basal inerolateral, mid inferolateral, and apical regions, post-stress EF 61%, Low-Risk Scan;    MEDICATIONS AND ALLERGIES REVIEWED IN EPIC No Change in Social and Family History  ROS: A comprehensive Review of Systems - Was performed  ,Review of Systems  Constitutional: Positive for weight loss.       Somewhat intentional  HENT: Negative for nosebleeds.   Eyes: Negative for blurred vision.  Respiratory: Negative for cough, hemoptysis, sputum production, shortness of breath and wheezing.   Cardiovascular: Positive for palpitations.       Otherwise per history of present illness  Gastrointestinal: Negative for blood in stool and melena.  Musculoskeletal: Positive for back pain and joint pain. Negative for falls.  Neurological: Negative.  Negative for dizziness, loss of consciousness and headaches.  Endo/Heme/Allergies: Does not bruise/bleed easily.  Psychiatric/Behavioral: Negative for depression. The patient is not nervous/anxious.   All other systems reviewed and are  negative.   Wt Readings from Last 3 Encounters:  01/06/14 173 lb 9.6 oz (78.744 kg)  07/02/13 180 lb 6.4 oz (81.829 kg)  01/03/13 169 lb 3.2 oz (76.749 kg)    PHYSICAL EXAM BP 160/76  Pulse 76  Ht 5\' 9"  (1.753 m)  Wt 173 lb 9.6 oz (78.744 kg)  BMI 25.62 kg/m2 General appearance: alert, cooperative, appears stated age, no distress and Well-nourished and well-groomed.  Neck: no adenopathy, no carotid bruit, no JVD and supple, symmetrical, trachea midline  Lungs: clear to auscultation bilaterally, normal percussion bilaterally and Nonlabored, good air movement  Heart: no R./G. There is a very soft 1/6 early peaking crescendo-decrescendo HSM at apex. Nondisplaced PMI.  Abdomen: soft, non-tender; bowel sounds normal; no masses, no organomegaly  Extremities: extremities normal, atraumatic, no cyanosis or edema, no edema, redness or tenderness in the calves or thighs and no ulcers, gangrene or trophic changes  Pulses: 2+ and symmetric  Neurologic: Alert and oriented X 3, normal strength and tone. Normal symmetric reflexes. Normal coordination and gait   Adult ECG Report  Rate: 76 ;  Rhythm: normal sinus rhythm; right bundle branch block, LVH, Left Axis Deviation, ~Right Atrial Abnormality.  Narrative Interpretation: Stable EKG  Recent Labs not performed:    ASSESSMENT / PLAN: CAD in native artery -- status post CABG x3 (LIMA-LAD, SVG RPDA, SVG-OM) He continues to do well without any angina or heart failure symptoms. Exercises frequently enough to stress his heart. He had a false negative stress test in 2011. The cardiac catheterization. He would prefer to whole wall on doing a stress test for now. Maybe performing them only for symptoms despite the desire to do routine followup. We can rediscuss this next year and consider whether to do a surveillance stress test. Plan: Continue current regimen of aspirin, statin, beta blocker and ACE inhibitor. He will get refills for these 3  prescriptions.  Essential hypertension As is usually the case, he has white coat syndrome. His blood pressure at home seems much better than ever this year. He is on a good dose of ACE inhibitor, we could increase his beta blocker dose to 50 mg if his pressures are not stable at home. Will defer management to his PCP he sees him more frequently.[name]  Hyperlipidemia with target LDL less than 70 His labs are followed by his PCP. He is on atorvastatin which will refill today. He tells me in the past that his lipids have been well controlled, of course have not seen labs since November of 2014. At that time his LDL was not at goal but otherwise HDL and triglycerides look great..  Sleep apnea He didn't tolerate CPAP. Mildly recommendation would be for him to see pulmonary medicine or some amounts to reevaluate for another technique. He' is going to try to wear Breathe Right strips. I do think that poorly controlled or untreated sleep apnea is a risk of worsening hypertension.    Orders Placed This Encounter  Procedures  .  EKG 12-Lead   Meds ordered this encounter  Medications  . atorvastatin (LIPITOR) 20 MG tablet    Sig: Take 1 tablet (20 mg total) by mouth at bedtime.    Dispense:  90 tablet    Refill:  3  . enalapril (VASOTEC) 20 MG tablet    Sig: Take 1 tablet (20 mg total) by mouth at bedtime.    Dispense:  90 tablet    Refill:  3  . metoprolol succinate (TOPROL-XL) 25 MG 24 hr tablet    Sig: Take 1 tablet (25 mg total) by mouth every morning.    Dispense:  90 tablet    Refill:  3    Followup: 6 months  DAVID W. Ellyn Hack, M.D., M.S. Interventional Cardiologist [facility]

## 2014-01-08 NOTE — Assessment & Plan Note (Addendum)
His labs are followed by his PCP. He is on atorvastatin which will refill today. He tells me in the past that his lipids have been well controlled, of course have not seen labs since November of 2014. At that time his LDL was not at goal but otherwise HDL and triglycerides look great.[name]

## 2014-01-08 NOTE — Assessment & Plan Note (Signed)
He continues to do well without any angina or heart failure symptoms. Exercises frequently enough to stress his heart. He had a false negative stress test in 2011. The cardiac catheterization. He would prefer to whole wall on doing a stress test for now. Maybe performing them only for symptoms despite the desire to do routine followup. We can rediscuss this next year and consider whether to do a surveillance stress test. Plan: Continue current regimen of aspirin, statin, beta blocker and ACE inhibitor. He will get refills for these 3 prescriptions.

## 2014-01-08 NOTE — Assessment & Plan Note (Signed)
As is usually the case, he has white coat syndrome. His blood pressure at home seems much better than ever this year. He is on a good dose of ACE inhibitor, we could increase his beta blocker dose to 50 mg if his pressures are not stable at home. Will defer management to his PCP he sees him more frequently.[name]

## 2014-01-08 NOTE — Assessment & Plan Note (Addendum)
He didn't tolerate CPAP. Mildly recommendation would be for him to see pulmonary medicine or some amounts to reevaluate for another technique. He' is going to try to wear Breathe Right strips. I do think that poorly controlled or untreated sleep apnea is a risk of worsening hypertension.

## 2014-03-18 ENCOUNTER — Encounter: Payer: Self-pay | Admitting: Cardiology

## 2014-04-04 ENCOUNTER — Ambulatory Visit (INDEPENDENT_AMBULATORY_CARE_PROVIDER_SITE_OTHER): Payer: Managed Care, Other (non HMO) | Admitting: Family Medicine

## 2014-04-04 VITALS — BP 162/92 | HR 71 | Temp 97.6°F | Resp 16 | Ht 68.25 in | Wt 175.6 lb

## 2014-04-04 DIAGNOSIS — S61219A Laceration without foreign body of unspecified finger without damage to nail, initial encounter: Secondary | ICD-10-CM

## 2014-04-04 DIAGNOSIS — Z23 Encounter for immunization: Secondary | ICD-10-CM

## 2014-04-04 DIAGNOSIS — S61221A Laceration with foreign body of left index finger without damage to nail, initial encounter: Secondary | ICD-10-CM

## 2014-04-04 MED ORDER — MUPIROCIN 2 % EX OINT: 1.0000 "application " | TOPICAL_OINTMENT | Freq: Three times a day (TID) | CUTANEOUS | Status: DC

## 2014-04-04 MED ORDER — CEPHALEXIN 500 MG PO CAPS: 500.0000 mg | ORAL_CAPSULE | Freq: Four times a day (QID) | ORAL | Status: DC

## 2014-04-04 NOTE — Patient Instructions (Addendum)
Laceration Care, Adult A laceration is a cut or lesion that goes through all layers of the skin and into the tissue just beneath the skin. TREATMENT  Some lacerations may not require closure. Some lacerations may not be able to be closed due to an increased risk of infection. It is important to see your caregiver as soon as possible after an injury to minimize the risk of infection and maximize the opportunity for successful closure. If closure is appropriate, pain medicines may be given, if needed. The wound will be cleaned to help prevent infection. Your caregiver will use stitches (sutures), staples, wound glue (adhesive), or skin adhesive strips to repair the laceration. These tools bring the skin edges together to allow for faster healing and a better cosmetic outcome. However, all wounds will heal with a scar. Once the wound has healed, scarring can be minimized by covering the wound with sunscreen during the day for 1 full year. HOME CARE INSTRUCTIONS  For sutures or staples:  Keep the wound clean and dry.  If you were given a bandage (dressing), you should change it at least once a day. Also, change the dressing if it becomes wet or dirty, or as directed by your caregiver.  Wash the wound with soap and water 2 times a day. Rinse the wound off with water to remove all soap. Pat the wound dry with a clean towel.  After cleaning, apply a thin layer of the antibiotic ointment as recommended by your caregiver. This will help prevent infection and keep the dressing from sticking.  You may shower as usual after the first 24 hours. Do not soak the wound in water until the sutures are removed.  Only take over-the-counter or prescription medicines for pain, discomfort, or fever as directed by your caregiver.  Get your sutures or staples removed as directed by your caregiver. For skin adhesive strips:  Keep the wound clean and dry.  Do not get the skin adhesive strips wet. You may bathe  carefully, using caution to keep the wound dry.  If the wound gets wet, pat it dry with a clean towel.  Skin adhesive strips will fall off on their own. You may trim the strips as the wound heals. Do not remove skin adhesive strips that are still stuck to the wound. They will fall off in time. For wound adhesive:  You may briefly wet your wound in the shower or bath. Do not soak or scrub the wound. Do not swim. Avoid periods of heavy perspiration until the skin adhesive has fallen off on its own. After showering or bathing, gently pat the wound dry with a clean towel.  Do not apply liquid medicine, cream medicine, or ointment medicine to your wound while the skin adhesive is in place. This may loosen the film before your wound is healed.  If a dressing is placed over the wound, be careful not to apply tape directly over the skin adhesive. This may cause the adhesive to be pulled off before the wound is healed.  Avoid prolonged exposure to sunlight or tanning lamps while the skin adhesive is in place. Exposure to ultraviolet light in the first year will darken the scar.  The skin adhesive will usually remain in place for 5 to 10 days, then naturally fall off the skin. Do not pick at the adhesive film. You may need a tetanus shot if:  You cannot remember when you had your last tetanus shot.  You have never had a tetanus  shot. If you get a tetanus shot, your arm may swell, get red, and feel warm to the touch. This is common and not a problem. If you need a tetanus shot and you choose not to have one, there is a rare chance of getting tetanus. Sickness from tetanus can be serious. SEEK MEDICAL CARE IF:   You have redness, swelling, or increasing pain in the wound.  You see a red line that goes away from the wound.  You have yellowish-white fluid (pus) coming from the wound.  You have a fever.  You notice a bad smell coming from the wound or dressing.  Your wound breaks open before or  after sutures have been removed.  You notice something coming out of the wound such as wood or glass.  Your wound is on your hand or foot and you cannot move a finger or toe. SEEK IMMEDIATE MEDICAL CARE IF:   Your pain is not controlled with prescribed medicine.  You have severe swelling around the wound causing pain and numbness or a change in color in your arm, hand, leg, or foot.  Your wound splits open and starts bleeding.  You have worsening numbness, weakness, or loss of function of any joint around or beyond the wound.  You develop painful lumps near the wound or on the skin anywhere on your body. MAKE SURE YOU:   Understand these instructions.  Will watch your condition.  Will get help right away if you are not doing well or get worse. Document Released: 04/25/2005 Document Revised: 07/18/2011 Document Reviewed: 10/19/2010 ExitCare Patient Information 2015 [employer]. This information is not intended to replace advice given to you by your health care provider. Make sure you discuss any questions you have with your health care provider.  Tdap Vaccine (Tetanus, Diphtheria, Pertussis): What You Need to Know 1. Why get vaccinated? Tetanus, diphtheria and pertussis can be very serious diseases, even for adolescents and adults. Tdap vaccine can protect us from these diseases. TETANUS (Lockjaw) causes painful muscle tightening and stiffness, usually all over the body.  It can lead to tightening of muscles in the head and neck so you can't open your mouth, swallow, or sometimes even breathe. Tetanus kills about 1 out of 5 people who are infected. DIPHTHERIA can cause a thick coating to form in the back of the throat.  It can lead to breathing problems, paralysis, heart failure, and death. PERTUSSIS (Whooping Cough) causes severe coughing spells, which can cause difficulty breathing, vomiting and disturbed sleep.  It can also lead to weight loss, incontinence, and rib  fractures. Up to 2 in 100 adolescents and 5 in 100 adults with pertussis are hospitalized or have complications, which could include pneumonia or death. These diseases are caused by bacteria. Diphtheria and pertussis are spread from person to person through coughing or sneezing. Tetanus enters the body through cuts, scratches, or wounds. Before vaccines, the [place] saw as many as 200,000 cases a year of diphtheria and pertussis, and hundreds of cases of tetanus. Since vaccination began, tetanus and diphtheria have dropped by about 99% and pertussis by about 80%. 2. Tdap vaccine Tdap vaccine can protect adolescents and adults from tetanus, diphtheria, and pertussis. One dose of Tdap is routinely given at age 11 or 12. People who did not get Tdap at that age should get it as soon as possible. Tdap is especially important for health care professionals and anyone having close contact with a baby younger than 12 months. Pregnant   women should get a dose of Tdap during every pregnancy, to protect the newborn from pertussis. Infants are most at risk for severe, life-threatening complications from pertussis. A similar vaccine, called Td, protects from tetanus and diphtheria, but not pertussis. A Td booster should be given every 10 years. Tdap may be given as one of these boosters if you have not already gotten a dose. Tdap may also be given after a severe cut or burn to prevent tetanus infection. Your doctor can give you more information. Tdap may safely be given at the same time as other vaccines. 3. Some people should not get this vaccine  If you ever had a life-threatening allergic reaction after a dose of any tetanus, diphtheria, or pertussis containing vaccine, OR if you have a severe allergy to any part of this vaccine, you should not get Tdap. Tell your doctor if you have any severe allergies.  If you had a coma, or long or multiple seizures within 7 days after a childhood dose of DTP or DTaP, you  should not get Tdap, unless a cause other than the vaccine was found. You can still get Td.  Talk to your doctor if you:  have epilepsy or another nervous system problem,  had severe pain or swelling after any vaccine containing diphtheria, tetanus or pertussis,  ever had Guillain-Barr Syndrome (GBS),  aren't feeling well on the day the shot is scheduled. 4. Risks of a vaccine reaction With any medicine, including vaccines, there is a chance of side effects. These are usually mild and go away on their own, but serious reactions are also possible. Brief fainting spells can follow a vaccination, leading to injuries from falling. Sitting or lying down for about 15 minutes can help prevent these. Tell your doctor if you feel dizzy or light-headed, or have vision changes or ringing in the ears. Mild problems following Tdap (Did not interfere with activities)  Pain where the shot was given (about 3 in 4 adolescents or 2 in 3 adults)  Redness or swelling where the shot was given (about 1 person in 5)  Mild fever of at least 100.4F (up to about 1 in 25 adolescents or 1 in 100 adults)  Headache (about 3 or 4 people in 10)  Tiredness (about 1 person in 3 or 4)  Nausea, vomiting, diarrhea, stomach ache (up to 1 in 4 adolescents or 1 in 10 adults)  Chills, body aches, sore joints, rash, swollen glands (uncommon) Moderate problems following Tdap (Interfered with activities, but did not require medical attention)  Pain where the shot was given (about 1 in 5 adolescents or 1 in 100 adults)  Redness or swelling where the shot was given (up to about 1 in 16 adolescents or 1 in 25 adults)  Fever over 102F (about 1 in 100 adolescents or 1 in 250 adults)  Headache (about 3 in 20 adolescents or 1 in 10 adults)  Nausea, vomiting, diarrhea, stomach ache (up to 1 or 3 people in 100)  Swelling of the entire arm where the shot was given (up to about 3 in 100). Severe problems following  Tdap (Unable to perform usual activities; required medical attention)  Swelling, severe pain, bleeding and redness in the arm where the shot was given (rare). A severe allergic reaction could occur after any vaccine (estimated less than 1 in a million doses). 5. What if there is a serious reaction? What should I look for?  Look for anything that concerns you, such as signs   of a severe allergic reaction, very high fever, or behavior changes. Signs of a severe allergic reaction can include hives, swelling of the face and throat, difficulty breathing, a fast heartbeat, dizziness, and weakness. These would start a few minutes to a few hours after the vaccination. What should I do?  If you think it is a severe allergic reaction or other emergency that can't wait, call 9-1-1 or get the person to the nearest hospital. Otherwise, call your doctor.  Afterward, the reaction should be reported to the "Vaccine Adverse Event Reporting System" (VAERS). Your doctor might file this report, or you can do it yourself through the VAERS web site at www.vaers.hhs.gov, or by calling 1-800-822-7967. VAERS is only for reporting reactions. They do not give medical advice.  6. The National Vaccine Injury Compensation Program The National Vaccine Injury Compensation Program (VICP) is a federal program that was created to compensate people who may have been injured by certain vaccines. Persons who believe they may have been injured by a vaccine can learn about the program and about filing a claim by calling 1-800-338-2382 or visiting the VICP website at www.hrsa.gov/vaccinecompensation. 7. How can I learn more?  Ask your doctor.  Call your local or state health department.  Contact the Centers for Disease Control and Prevention (CDC):  Call 1-800-232-4636 or visit CDC's website at www.cdc.gov/vaccines. CDC Tdap Vaccine VIS (09/15/11) Document Released: 10/25/2011 Document Revised: 09/09/2013 Document Reviewed:  08/07/2013 ExitCare Patient Information 2015 [employer]. This information is not intended to replace advice given to you by your health care provider. Make sure you discuss any questions you have with your health care provider.  

## 2014-04-04 NOTE — Progress Notes (Signed)
MRN: [id number] DOB: Jan 11, 1941  Subjective:   [name] [name] is a 73 y.o. male presenting for 2 day history of laceration to left 2nd finger pad. Patient was doing yardwork using a [employer] and cut his finger. Patient used kerosene oil, tea tree oil and an "antibiotic cream", wrapped his finger in gauze Wednesday and has generally kept it covered, switched the gauze out twice. He reports some decreased ROM, mild swelling, and pain only with pressing the wound. He denies fevers, n/v, diarrhea, erythema, loss of sensation, discoloration of his finger, malodor, pus or drainage. He cannot recall his last tetanus specifically states that at his last physical his immunizations were up to date, confirms that he has previously had rashes with doxycycline and sulfa drugs. Of note, patient will be leaving to [place] on 04/09/2014 and will not be returning for 2 weeks. Denies any other aggravating or relieving factors, no other questions or concerns.   Prior to Admission medications   Medication Sig Start Date End Date Taking? Authorizing Provider  aspirin 81 MG tablet Take 81 mg by mouth daily.   Yes Historical Provider, MD  atorvastatin (LIPITOR) 20 MG tablet Take 1 tablet (20 mg total) by mouth at bedtime. 01/06/14  Yes Leonie Man, MD  Coenzyme Q10 (CO Q 10 PO) Take by mouth.   Yes Historical Provider, MD  enalapril (VASOTEC) 20 MG tablet Take 1 tablet (20 mg total) by mouth at bedtime. 01/06/14  Yes Leonie Man, MD  levothyroxine (SYNTHROID, LEVOTHROID) 125 MCG tablet Take 125 mcg by mouth. AM BEFORE BREAKFAST   Yes Historical Provider, MD  metoprolol succinate (TOPROL-XL) 25 MG 24 hr tablet Take 1 tablet (25 mg total) by mouth every morning. 01/06/14  Yes Leonie Man, MD  Multiple Vitamin (MULTIVITAMIN) tablet Take 1 tablet by mouth daily.   Yes Historical Provider, MD  Omega-3 Fatty Acids (FISH OIL PO) Take by mouth.   Yes Historical Provider, MD    Allergies  Allergen Reactions  .  Contrast Media [Iodinated Diagnostic Agents] Other (See Comments)    Unknown   . Crestor [Rosuvastatin]     SEVERE LEG CRAMPS  . Sulfa Antibiotics     REACTION WAS YRS AGO - SWELLING, SKIN REDNESS  . Bactrim [Sulfamethoxazole-Trimethoprim] Rash  . Doxycycline Rash    Past Medical History  Diagnosis Date  . CAD in native artery 1990    Status post PCI to the RCA --> CABG in 2002:  . S/P CABG x 3 2002    LIMA-LAD, SVG-PDA, SVG-OM.  [name] Hypertension   . Hypothyroidism   . Hematuria     OCASIONAL HEMATURIA UNK CAUSE  . Arthritis   . PONV (postoperative nausea and vomiting)   . OSA (obstructive sleep apnea)     AHI-18.3/hr, AHI REM-3.1/hr UNABLE TO ADJUST TO USING C-PAP  . Dyslipidemia, goal LDL below 70      on statin    Past Surgical History  Procedure Laterality Date  . Coronary artery bypass graft  Jan 2002    3 VESSELS 2002; LIMA-LAD, SVG-OM, SVG to RPDA.  [name] Cholecystectomy      1997  . Total knee arthroplasty  03/21/2011    Procedure: TOTAL KNEE ARTHROPLASTY;  Surgeon: Gearlean Alf;  Location: [facility];  Service: Orthopedics;  Laterality: Left;  . Joint replacement    . Total knee arthroplasty  06/01/2012    Procedure: TOTAL KNEE ARTHROPLASTY;  Surgeon: Gearlean Alf, MD;  Location: [facility];  Service: Orthopedics;  Laterality: Right;  . Cardiac catheterization  03/17/2010    Patent LIMA-LAD, small non-intervenable D1 with ostial 70% stenosis; patent SVG-OM with retrograde flow to the native circumflex & 90% stenosis in main Cx. 60-70% lesion in AVG Cx. Patent SVG-RPDA with retrograde flow filling the PL system.  . Cardiac catheterization  03/28/2000    Recommended CABG --> CABG in Jan 2002  . Coronary angioplasty  12/02/1988    Proximal RCA 95% stenosis -- PTCA inflated with a 2.72mm balloon --> 3.0 mm Piccolino balloon resulting in a 20-30% irregularity; Distal RCA 90% stenosis --> 2.5 balloon results were normal  . Cardiac catheterization  10/21/1988    Severe disease in  Mid and Distal RCA. Mid portion-90%, distal portion-80%  . Coronary angioplasty  08/04/1988    Distal RCA 95% occluded -> PTCA w/ 2.5 mm balloon -->.  30% irregularity  . Nm myoview ltd  September 2011    mild ischemia in basal inerolateral, mid inferolateral, and apical regions, post-stress EF 61%, Low-Risk Scan;    History  Substance Use Topics  . Smoking status: Former Smoker    Quit date: 03/14/1971  . Smokeless tobacco: Never Used  . Alcohol Use: No    ROS As in subjective.   Objective:   Vitals: BP 162/92 mmHg  Pulse 71  Temp(Src) 97.6 F (36.4 C) (Oral)  Resp 16  Ht 5' 8.25" (1.734 m)  Wt 175 lb 9.6 oz (79.652 kg)  BMI 26.49 kg/m2  SpO2 98%  Physical Exam  Constitutional: He is oriented to person, place, and time and well-developed, well-nourished, and in no distress. No distress.  Cardiovascular: Normal rate.   Pulmonary/Chest: Effort normal.  Neurological: He is alert and oriented to person, place, and time.  Skin: Skin is warm and dry. Laceration (left 2nd finger pad; tender, mildly edematous, decreased ROM, sensation intact, no erythema, pus or drainage) noted. No rash noted. He is not diaphoretic. No erythema.    Assessment and Plan :   1. Need for prophylactic vaccination with combined diphtheria-tetanus-pertussis (DTP) vaccine - Tdap vaccine greater than or equal to 7yo IM  2. Finger laceration, initial encounter - laceration sustained >48 hours ago, used xeroform instead of foil graft since patient will not be available for 2 weeks 5 days from today. - F/u on Sunday, 04/06/2014, consider referral if no improvement in laceration - cephALEXin (KEFLEX) 500 MG capsule; Take 1 capsule (500 mg total) by mouth 4 (four) times daily.  Dispense: 40 capsule; Refill: 0 - mupirocin ointment (BACTROBAN) 2 %; Apply 1 application topically 3 (three) times daily.  Dispense: 30 g; Refill: 0   Jaynee Eagles, PA-C [facility] [phone] 04/04/2014 10:09 AM

## 2014-04-06 ENCOUNTER — Ambulatory Visit (INDEPENDENT_AMBULATORY_CARE_PROVIDER_SITE_OTHER): Payer: Managed Care, Other (non HMO) | Admitting: Physician Assistant

## 2014-04-06 VITALS — BP 166/78 | HR 78 | Temp 97.7°F | Resp 20 | Ht 68.5 in | Wt 176.1 lb

## 2014-04-06 DIAGNOSIS — S61221D Laceration with foreign body of left index finger without damage to nail, subsequent encounter: Secondary | ICD-10-CM

## 2014-04-06 DIAGNOSIS — S61219D Laceration without foreign body of unspecified finger without damage to nail, subsequent encounter: Secondary | ICD-10-CM

## 2014-04-06 NOTE — Progress Notes (Signed)
Subjective:    Patient ID: [name] [name], male    DOB: [birth year], 73 y.o.   MRN: [id number]   PCP: Mayra Neer, MD  Chief Complaint  Patient presents with  . Wound Care    Allergies  Allergen Reactions  . Contrast Media [Iodinated Diagnostic Agents] Other (See Comments)    Unknown   . Crestor [Rosuvastatin]     SEVERE LEG CRAMPS  . Sulfa Antibiotics     REACTION WAS YRS AGO - SWELLING, SKIN REDNESS  . Bactrim [Sulfamethoxazole-Trimethoprim] Rash  . Doxycycline Rash    Patient Active Problem List   Diagnosis Date Noted  . Essential hypertension 07/04/2013  . Hyperlipidemia with target LDL less than 70 01/03/2013  . Sleep apnea   . Hypothyroidism   . Osteoarthritis of left knee 03/21/2011  . CAD in native artery -- status post CABG x3 (LIMA-LAD, SVG RPDA, SVG-OM) 08/01/1988    Prior to Admission medications   Medication Sig Start Date End Date Taking? Authorizing Provider  aspirin 81 MG tablet Take 81 mg by mouth daily.   Yes Historical Provider, MD  atorvastatin (LIPITOR) 20 MG tablet Take 1 tablet (20 mg total) by mouth at bedtime. 01/06/14  Yes Leonie Man, MD  cephALEXin (KEFLEX) 500 MG capsule Take 1 capsule (500 mg total) by mouth 4 (four) times daily. 04/04/14  Yes Jaynee Eagles, PA-C  Coenzyme Q10 (CO Q 10 PO) Take by mouth.   Yes Historical Provider, MD  enalapril (VASOTEC) 20 MG tablet Take 1 tablet (20 mg total) by mouth at bedtime. 01/06/14  Yes Leonie Man, MD  levothyroxine (SYNTHROID, LEVOTHROID) 125 MCG tablet Take 125 mcg by mouth. AM BEFORE BREAKFAST   Yes Historical Provider, MD  metoprolol succinate (TOPROL-XL) 25 MG 24 hr tablet Take 1 tablet (25 mg total) by mouth every morning. 01/06/14  Yes Leonie Man, MD  Multiple Vitamin (MULTIVITAMIN) tablet Take 1 tablet by mouth daily.   Yes Historical Provider, MD  mupirocin ointment (BACTROBAN) 2 % Apply 1 application topically 3 (three) times daily. 04/04/14  Yes Jaynee Eagles, PA-C  Omega-3 Fatty  Acids (FISH OIL PO) Take by mouth.   Yes Historical Provider, MD    Medical, Surgical, Family and Social History reviewed and updated.  HPI  This 73 y.o. male presents for evaluation of Index finger wound sustained 04/02/2014.  He presented here 2 days later and was started on Keflex. The injury was really more of a "shred" than laceration, and a modest tissue deficit centrally, and suture closure was not performed. A small piece of Vaseline gauze was placed and dressing applied.  He has been performing wound care at home and comes in for a recheck today. He leaves this week for vacation in [place].  Pain is improving-it's really just tender now. No swelling, only a small amount of straw-colored drainage. No fever, chills, red streaking.   Review of Systems As above.    Objective:   Physical Exam  BP 166/78 mmHg  Pulse 78  Temp(Src) 97.7 F (36.5 C) (Oral)  Resp 20  Ht 5' 8.5" (1.74 m)  Wt 176 lb 2 oz (79.89 kg)  BMI 26.39 kg/m2  SpO2 99% WDWNWM, A&O x 3.  Bandage removed. Xeroform removed. Wound is healing well, already with a small amount of wound edge contraction around the tissue deficit.  Minimally tender. No erythema, edema. No drainage.  Washed with soap and water. Loose dressing applied.      Assessment & Plan:  1. Finger laceration, subsequent encounter Local wound care. Anticipatory guidance. Recheck here on 12/01 if needed before leaving for [place].   Fara Chute, PA-C Physician Assistant[facility]ified [facility]

## 2014-04-06 NOTE — Patient Instructions (Addendum)
Wash the wound at least daily with soap and water (no special things, just the soap you have at the sink or in the shower). Allow the wound to stay open to the air as much as possible. If you will be doing something where it could get dirty, or if wearing a bandage helps to keep you from bumping it as much, apply a small dab of antibiotic ointment and a bandaid.  Enjoy [place]!

## 2014-04-06 NOTE — Progress Notes (Signed)
Pt assessed, reviewed documentation and agree w/ assessment and plan. Eva Shaw, MD MPH   

## 2014-04-06 NOTE — Addendum Note (Signed)
Addended by: Fara Chute on: 04/06/2014 01:15 PM   Modules accepted: Level of Service

## 2014-05-03 IMAGING — CR DG CHEST 2V
2 series · 2 of 2 positions shown · non-contrast
Comparison: 03/14/2011

CLINICAL DATA: Preop right knee arthroplasty

CHEST - 2 VIEW

[w chest pa]
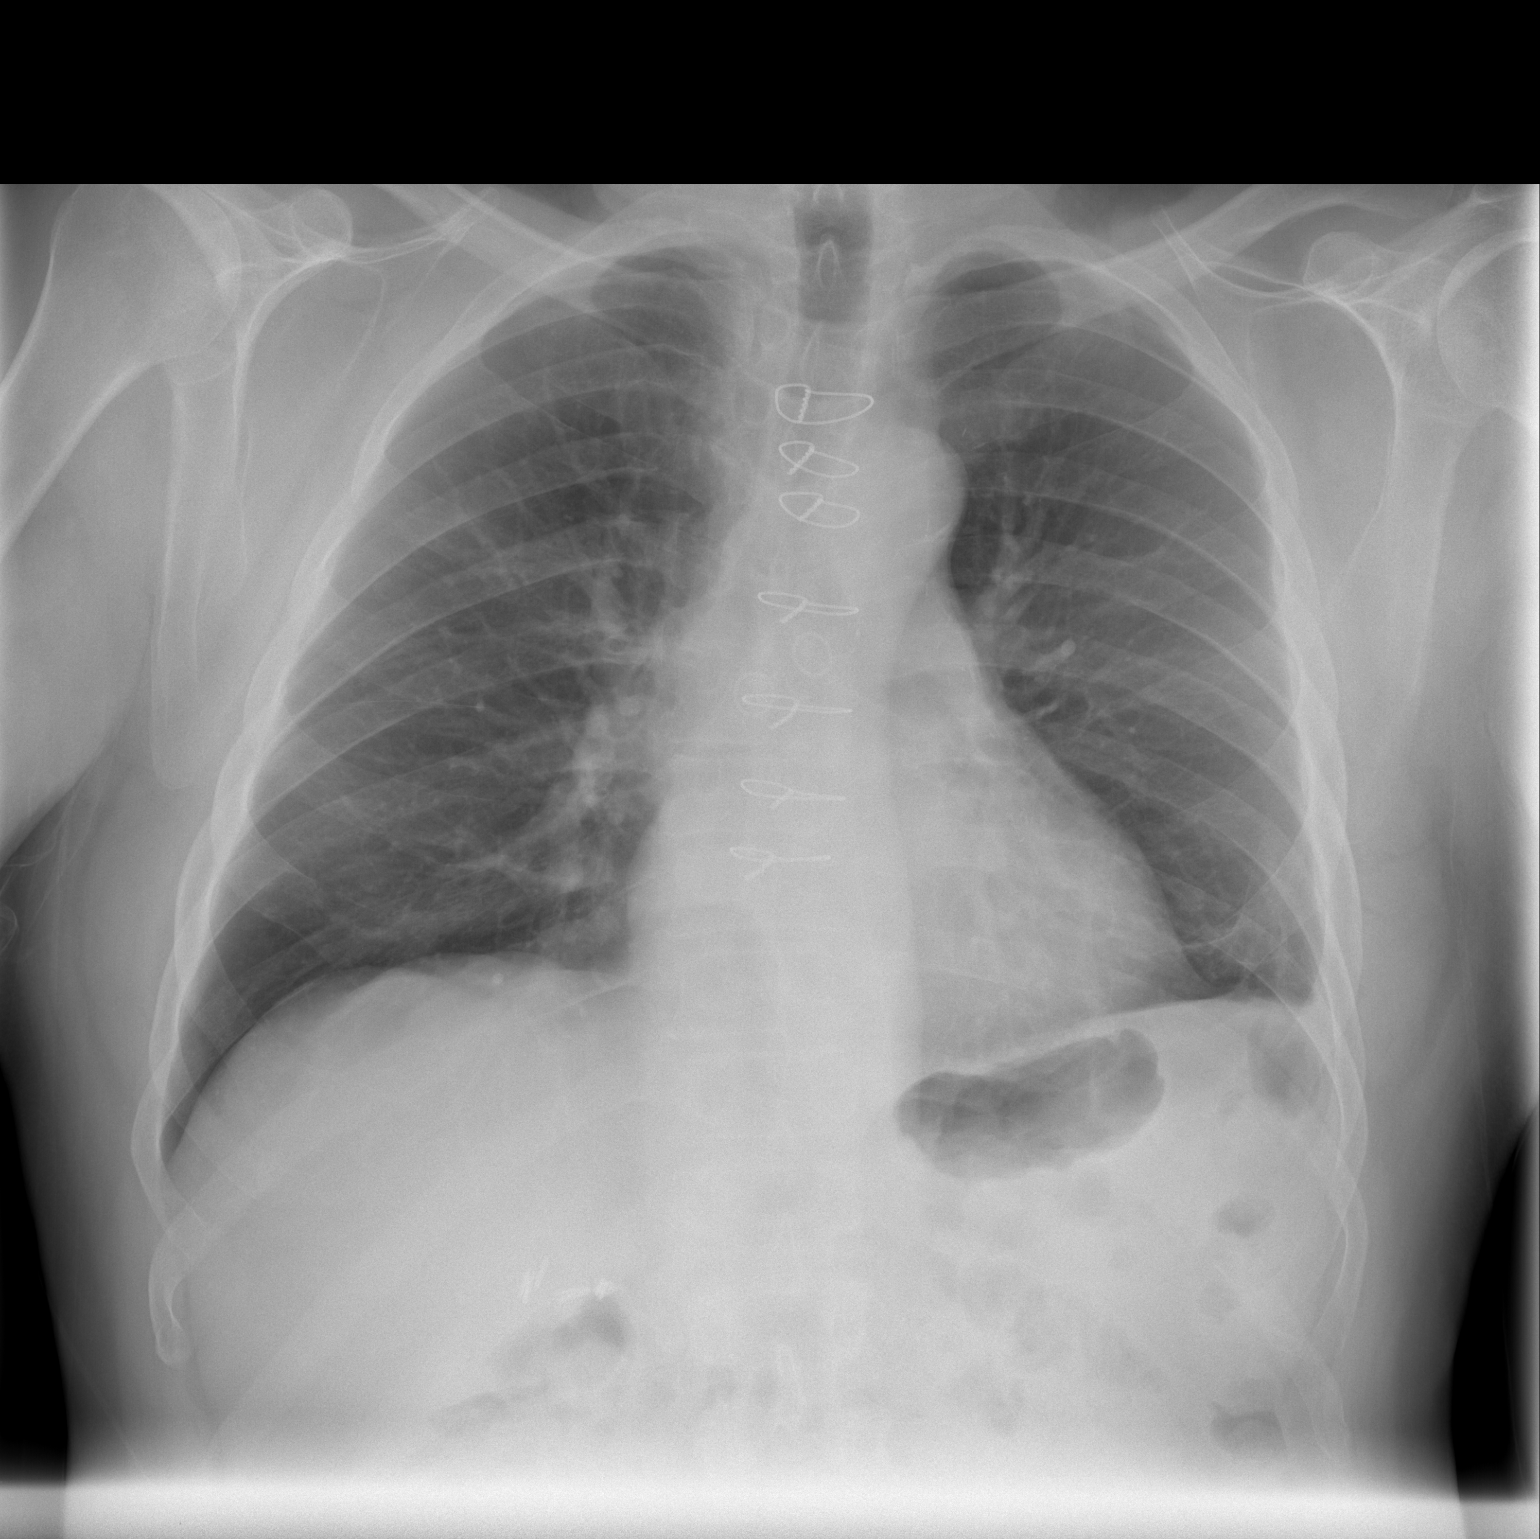

[w chest lat]
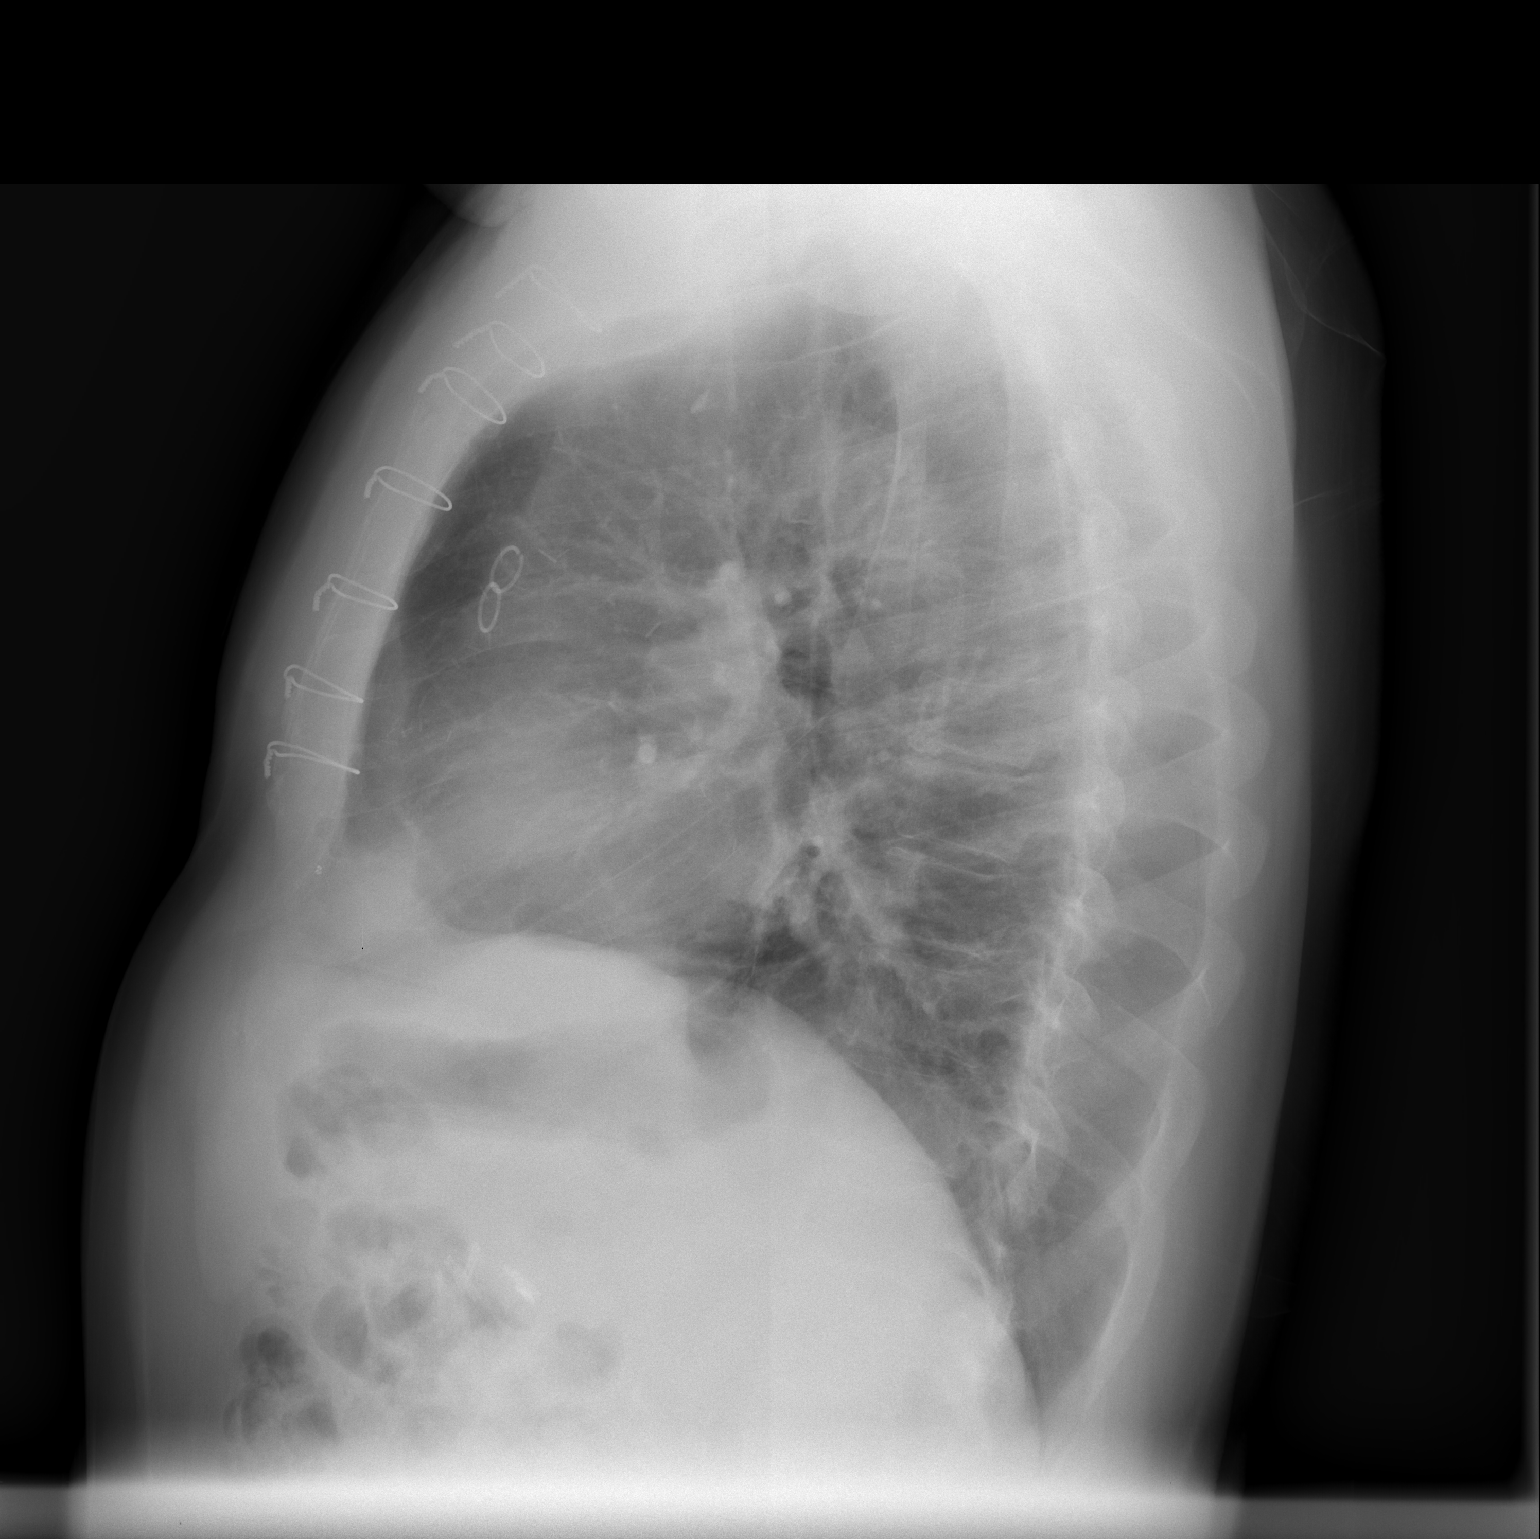

[2 of 2 positions shown; findings below may reference images not displayed]

FINDINGS: Postop CABG.  Negative for heart failure.  Mild scarring
in the left lung base is unchanged.  Negative for pneumonia.  No
mass lesion identified.
IMPRESSION: Chronic scarring left lung base.  No acute abnormality.

## 2014-07-31 ENCOUNTER — Other Ambulatory Visit: Payer: Self-pay | Admitting: Cardiology

## 2014-07-31 MED ORDER — METOPROLOL SUCCINATE ER 25 MG PO TB24: 25.0000 mg | ORAL_TABLET | ORAL | Status: DC

## 2014-07-31 MED ORDER — ATORVASTATIN CALCIUM 20 MG PO TABS: 20.0000 mg | ORAL_TABLET | Freq: Every day | ORAL | Status: DC

## 2014-07-31 NOTE — Telephone Encounter (Signed)
°  1. Which medications need to be refilled? Atorvastatin and Metoprolol  2. Which pharmacy is medication to be sent to?[facility] pharmacy  3. Do they need a 30 day or 90 day supply? 90 days  4. Would they like a call back once the medication has been sent to the pharmacy? yes

## 2014-07-31 NOTE — Telephone Encounter (Signed)
Rx(s) sent to pharmacy electronically. Patient notified. 

## 2014-12-02 DIAGNOSIS — H5203 Hypermetropia, bilateral: Secondary | ICD-10-CM | POA: Diagnosis not present

## 2014-12-02 DIAGNOSIS — H52223 Regular astigmatism, bilateral: Secondary | ICD-10-CM | POA: Diagnosis not present

## 2014-12-02 DIAGNOSIS — H524 Presbyopia: Secondary | ICD-10-CM | POA: Diagnosis not present

## 2014-12-02 DIAGNOSIS — H2512 Age-related nuclear cataract, left eye: Secondary | ICD-10-CM | POA: Diagnosis not present

## 2014-12-02 DIAGNOSIS — H5210 Myopia, unspecified eye: Secondary | ICD-10-CM | POA: Diagnosis not present

## 2014-12-17 ENCOUNTER — Other Ambulatory Visit: Payer: Self-pay | Admitting: Cardiology

## 2014-12-17 NOTE — Telephone Encounter (Signed)
Rx(s) sent to pharmacy electronically.  

## 2015-02-20 ENCOUNTER — Other Ambulatory Visit: Payer: Self-pay | Admitting: Cardiology

## 2015-02-20 NOTE — Telephone Encounter (Signed)
Rx(s) sent to pharmacy electronically. LM for patient that he needs to call office to make an appointment.

## 2015-03-06 DIAGNOSIS — Z23 Encounter for immunization: Secondary | ICD-10-CM | POA: Diagnosis not present

## 2015-03-06 DIAGNOSIS — M1990 Unspecified osteoarthritis, unspecified site: Secondary | ICD-10-CM | POA: Diagnosis not present

## 2015-03-06 DIAGNOSIS — M542 Cervicalgia: Secondary | ICD-10-CM | POA: Diagnosis not present

## 2015-03-19 ENCOUNTER — Ambulatory Visit: Payer: Commercial Managed Care - HMO | Attending: Family Medicine | Admitting: Physical Therapy

## 2015-03-19 DIAGNOSIS — M542 Cervicalgia: Secondary | ICD-10-CM | POA: Diagnosis not present

## 2015-03-19 DIAGNOSIS — M6281 Muscle weakness (generalized): Secondary | ICD-10-CM

## 2015-03-19 DIAGNOSIS — M436 Torticollis: Secondary | ICD-10-CM | POA: Diagnosis not present

## 2015-03-19 DIAGNOSIS — R293 Abnormal posture: Secondary | ICD-10-CM | POA: Diagnosis not present

## 2015-03-19 NOTE — Therapy (Signed)
[place], [place], [address] Phone: [phone]   Fax:  [phone]  Physical Therapy Evaluation  Patient Details  Name: [name] [name] MRN: [id number] Date of Birth: 05/11/1940 Referring Provider: Dr. Gaynelle Arabian  Encounter Date: 03/19/2015      PT End of Session - 03/19/15 0900    Visit Number 1   Number of Visits 16   Date for PT Re-Evaluation 05/14/15   Authorization Type Medicare; G codes; 10th visit prog note; KX at visit 15   PT Start Time 0800   PT Stop Time 0845   PT Time Calculation (min) 45 min   Activity Tolerance Patient tolerated treatment well      Past Medical History  Diagnosis Date  . CAD in native artery 1990    Status post PCI to the RCA --> CABG in 2002:  . S/P CABG x 3 2002    LIMA-LAD, SVG-PDA, SVG-OM.  [name] Hypertension   . Hypothyroidism   . Hematuria     OCASIONAL HEMATURIA UNK CAUSE  . Arthritis   . PONV (postoperative nausea and vomiting)   . OSA (obstructive sleep apnea)     AHI-18.3/hr, AHI REM-3.1/hr UNABLE TO ADJUST TO USING C-PAP  . Dyslipidemia, goal LDL below 70      on statin    Past Surgical History  Procedure Laterality Date  . Coronary artery bypass graft  Jan 2002    3 VESSELS 2002; LIMA-LAD, SVG-OM, SVG to RPDA.  [name] Cholecystectomy      1997  . Total knee arthroplasty  03/21/2011    Procedure: TOTAL KNEE ARTHROPLASTY;  Surgeon: Gearlean Alf;  Location: [facility];  Service: Orthopedics;  Laterality: Left;  . Joint replacement    . Total knee arthroplasty  06/01/2012    Procedure: TOTAL KNEE ARTHROPLASTY;  Surgeon: Gearlean Alf, MD;  Location: [facility];  Service: Orthopedics;  Laterality: Right;  . Cardiac catheterization  03/17/2010    Patent LIMA-LAD, small non-intervenable D1 with ostial 70% stenosis; patent SVG-OM with retrograde flow to the native circumflex & 90% stenosis in main Cx. 60-70% lesion in AVG Cx. Patent SVG-RPDA with retrograde  flow filling the PL system.  . Cardiac catheterization  03/28/2000    Recommended CABG --> CABG in Jan 2002  . Coronary angioplasty  12/02/1988    Proximal RCA 95% stenosis -- PTCA inflated with a 2.47mm balloon --> 3.0 mm Piccolino balloon resulting in a 20-30% irregularity; Distal RCA 90% stenosis --> 2.5 balloon results were normal  . Cardiac catheterization  10/21/1988    Severe disease in Mid and Distal RCA. Mid portion-90%, distal portion-80%  . Coronary angioplasty  08/04/1988    Distal RCA 95% occluded -> PTCA w/ 2.5 mm balloon -->.  30% irregularity  . Nm myoview ltd  September 2011    mild ischemia in basal inerolateral, mid inferolateral, and apical regions, post-stress EF 61%, Low-Risk Scan;    There were no vitals filed for this visit.  Visit Diagnosis:  Neck pain - Plan: PT plan of care cert/re-cert  Stiffness of cervical spine - Plan: PT plan of care cert/re-cert  Muscle weakness - Plan: PT plan of care cert/re-cert  Poor posture - Plan: PT plan of care cert/re-cert      Subjective Assessment - 03/19/15 0803    Subjective Presents with neck pain.  In spring he was using a saw on a pole extender while on a ladder.  Initially pain in  right upper arm while shaving but has resolved.    Pain with neck extension and turning head.   Worse at night.  Takes ibuprofen.  Continues to go to the gym only overhead bothers.     Limitations Lifting;Sitting   How long can you sit comfortably? while watching TV at night   Diagnostic tests none    Patient Stated Goals curious to know what it is   Currently in Pain? Yes   Pain Score 1    Pain Location Neck   Pain Orientation Right   Pain Descriptors / Indicators Aching   Pain Type Chronic pain   Pain Onset More than a month ago   Pain Frequency Intermittent   Aggravating Factors  worse at night, turning head, extending head   Pain Relieving Factors ibuprofen            [facility] PT Assessment - 03/19/15 0810    Assessment    Medical Diagnosis neck pain   Referring Provider Dr. Gaynelle Arabian   Onset Date/Surgical Date --  Spring    Hand Dominance Right   Next MD Visit as needed   Prior Therapy for TKR   Precautions   Precautions None   Restrictions   Weight Bearing Restrictions No   Balance Screen   Has the patient fallen in the past 6 months No   Has the patient had a decrease in activity level because of a fear of falling?  No   Is the patient reluctant to leave their home because of a fear of falling?  No   Home Environment   Living Environment Private residence   Type of Home House   Prior Function   Level of [place] Retired   Leisure play golf   Observation/Other Assessments   Focus on Therapeutic Outcomes (FOTO)  40% limitation   Posture/Postural Control   Postural Limitations Rounded Shoulders;Forward head   AROM   Cervical Flexion 50   Cervical Extension 55   Cervical - Right Side Bend 26   Cervical - Left Side Bend 24   Cervical - Right Rotation 37   Cervical - Left Rotation 35   Strength   Strength Assessment Site --  Periscapular strength 4/5   Cervical Flexion 4/5   Cervical Extension 4/5   Palpation   Palpation comment mild right upper trap   Spurling's   Findings Negative   Distraction Test   Findngs Positive                           PT Education - 03/19/15 0900    Education provided Yes   Education Details sitting postural correction; cervical retractions   Person(s) Educated Patient   Methods Explanation;Demonstration;Handout   Comprehension Verbalized understanding;Returned demonstration          PT Short Term Goals - 03/19/15 0909    PT SHORT TERM GOAL #1   Title The patient will demonstrate improved sitting postural correction 04/16/15   Time 4   Period Weeks   Status New   PT SHORT TERM GOAL #2   Title The patient will have improved cervical sidebending to 43 degrees needed for yard work, golf   Time 4    Period Weeks   Status New   PT SHORT TERM GOAL #3   Title Patient will report a 25% improvement in pain with home, yard and recreational activities   Time 4   Period  Weeks   Status New           PT Long Term Goals - 24-Mar-2015 0913    PT LONG TERM GOAL #1   Title The patient will be independent in safe, self progression of HEP for further improvements in pain, ROM and strength. 05/14/15   Time 8   Period Weeks   Status New   PT LONG TERM GOAL #2   Title The patient will have improved deep cervical flexor and extensor muscle strength and periscapular strength to 4+/5 needed for greater ease with yardwork, walking the dog and golf. 05/14/15   Time 8   Period Weeks   Status New   PT LONG TERM GOAL #3   Title The patient will have improved cervical extension to 60 degrees and rotation to 45 degrees for greater ease for driving and yard activities. 05/14/15   Time 8   Period Weeks   Status New   PT LONG TERM GOAL #4   Title Patient will report an overall improvement in pain and function > 60% with ADLs.  05/14/15   Time 8   Period Weeks   Status New   PT LONG TERM GOAL #5   Title FOTO functional outcome score improved to 32% indicating improved function with less pain   Time 8   Period Weeks   Status New               Plan - Mar 24, 2015 0901    Clinical Impression Statement The patient is a pleasant 74 year old right right neck and upper trap region pain since the Spring after using a saw on a pole extender to trim branches.  His pain is worse when sitting watching TV in the evenings and with neck extension or rotation.  He is better with moving around.  He continues to play golf and go  to the gym with mild increase in discomfort with overhead press.  Reports night discomfort with lying prone.   Forward head and rounded shoulder posture.  His cervical AROM is decreased and with discomfort with extension and left sidebending.  Mild tenderness in right upper trap.  Negative Spurlings,  pain relief with cervical distraction.  Decreased activation of deep cervical flexors and periscapular muscles 4/5.     Pt will benefit from skilled therapeutic intervention in order to improve on the following deficits Pain;Decreased strength;Decreased range of motion;Increased muscle spasms;Postural dysfunction   Rehab Potential Good   PT Frequency 2x / week   PT Duration 8 weeks   PT Treatment/Interventions ADLs/Self Care Home Management;Electrical Stimulation;Moist Heat;Traction;Ultrasound;Therapeutic exercise;Patient/family education;Manual techniques;Dry needling;Taping   PT Next Visit Plan dry needling right upper trap; add upper trap stretch to HEP; scapular retraction; cervical distraction;  heat/e-stim as needed;  upper trap inhibition taping   PT Home Exercise Plan seated and supine cervical retractions; posture correction          G-Codes - 03/24/15 0920    Functional Assessment Tool Used FOTO;clinical judgement   Functional Limitation Carrying, moving and handling objects   Carrying, Moving and Handling Objects Current Status [id number]) At least 40 percent but less than 60 percent impaired, limited or restricted   Carrying, Moving and Handling Objects Goal Status [id number]) At least 20 percent but less than 40 percent impaired, limited or restricted       Problem List Patient Active Problem List   Diagnosis Date Noted  . Essential hypertension 07/04/2013  . Hyperlipidemia with target LDL less than 70  01/03/2013  . Sleep apnea   . Hypothyroidism   . Osteoarthritis of left knee 03/21/2011  . CAD in native artery -- status post CABG x3 (LIMA-LAD, SVG RPDA, SVG-OM) 08/01/1988    Alvera Singh 03/19/2015, 9:23 AM  [facility] [address] [place], [place], [address] Phone: [phone]   Fax:  [phone]  Name: [name] [name] MRN: [id number] Date of Birth: September 01, 1940  Ruben Im, PT 03/19/2015 9:24 AM Phone:  [phone] Fax: [phone]

## 2015-03-19 NOTE — Patient Instructions (Addendum)
Watch posture; use lumbar roll for sitting  Cervical retractions sitting or standing 10x 3-4x/day;   Extensors, Supine   Lie supine, head on small, rolled towel. Gently tuck chin and bring toward chest. Hold _3__ seconds. Repeat _8__ times per session. Do _4__ sessions per day.  Copyright  VHI. All rights reserved.  Flexibility: Neck Retraction   Pull head straight back, keeping eyes and jaw level. Repeat ___8_ times per set. Do __1__ sets per session. Do ___4_ sessions per day.  http://orth.exer.us/344   Copyright  VHI. All rights reserved.

## 2015-04-07 ENCOUNTER — Ambulatory Visit: Payer: Commercial Managed Care - HMO | Admitting: Physical Therapy

## 2015-04-07 DIAGNOSIS — M436 Torticollis: Secondary | ICD-10-CM | POA: Diagnosis not present

## 2015-04-07 DIAGNOSIS — M6281 Muscle weakness (generalized): Secondary | ICD-10-CM

## 2015-04-07 DIAGNOSIS — R293 Abnormal posture: Secondary | ICD-10-CM

## 2015-04-07 DIAGNOSIS — M542 Cervicalgia: Secondary | ICD-10-CM | POA: Diagnosis not present

## 2015-04-07 NOTE — Therapy (Signed)
[place], [place], [address] Phone: [phone]   Fax:  [phone]  Physical Therapy Treatment  Patient Details  Name: [name] [name] MRN: [id number] Date of Birth: 04-01-1941 Referring Provider: Dr. Gaynelle Arabian  Encounter Date: 04/07/2015      PT End of Session - 04/07/15 1356    Visit Number 2   Number of Visits 16   Date for PT Re-Evaluation 05/14/15   Authorization Type Medicare; G codes; 10th visit prog note; KX at visit 15   PT Start Time 0131   PT Stop Time 0230   PT Time Calculation (min) 59 min      Past Medical History  Diagnosis Date  . CAD in native artery 1990    Status post PCI to the RCA --> CABG in 2002:  . S/P CABG x 3 2002    LIMA-LAD, SVG-PDA, SVG-OM.  [name] Hypertension   . Hypothyroidism   . Hematuria     OCASIONAL HEMATURIA UNK CAUSE  . Arthritis   . PONV (postoperative nausea and vomiting)   . OSA (obstructive sleep apnea)     AHI-18.3/hr, AHI REM-3.1/hr UNABLE TO ADJUST TO USING C-PAP  . Dyslipidemia, goal LDL below 70      on statin    Past Surgical History  Procedure Laterality Date  . Coronary artery bypass graft  Jan 2002    3 VESSELS 2002; LIMA-LAD, SVG-OM, SVG to RPDA.  [name] Cholecystectomy      1997  . Total knee arthroplasty  03/21/2011    Procedure: TOTAL KNEE ARTHROPLASTY;  Surgeon: Gearlean Alf;  Location: [facility];  Service: Orthopedics;  Laterality: Left;  . Joint replacement    . Total knee arthroplasty  06/01/2012    Procedure: TOTAL KNEE ARTHROPLASTY;  Surgeon: Gearlean Alf, MD;  Location: [facility];  Service: Orthopedics;  Laterality: Right;  . Cardiac catheterization  03/17/2010    Patent LIMA-LAD, small non-intervenable D1 with ostial 70% stenosis; patent SVG-OM with retrograde flow to the native circumflex & 90% stenosis in main Cx. 60-70% lesion in AVG Cx. Patent SVG-RPDA with retrograde flow filling the PL system.  . Cardiac catheterization   03/28/2000    Recommended CABG --> CABG in Jan 2002  . Coronary angioplasty  12/02/1988    Proximal RCA 95% stenosis -- PTCA inflated with a 2.32m balloon --> 3.0 mm Piccolino balloon resulting in a 20-30% irregularity; Distal RCA 90% stenosis --> 2.5 balloon results were normal  . Cardiac catheterization  10/21/1988    Severe disease in Mid and Distal RCA. Mid portion-90%, distal portion-80%  . Coronary angioplasty  08/04/1988    Distal RCA 95% occluded -> PTCA w/ 2.5 mm balloon -->.  30% irregularity  . Nm myoview ltd  September 2011    mild ischemia in basal inerolateral, mid inferolateral, and apical regions, post-stress EF 61%, Low-Risk Scan;    There were no vitals filed for this visit.  Visit Diagnosis:  Neck pain  Stiffness of cervical spine  Muscle weakness  Poor posture      Subjective Assessment - 04/07/15 1333    Subjective Pain is 2/10   Currently in Pain? Yes   Pain Location Neck   Pain Orientation Right   Pain Descriptors / Indicators Sore   Aggravating Factors  turning to the right, looking up   Pain Relieving Factors ibuprofen            [facility] PT Assessment - 04/07/15 1340  AROM   Cervical Flexion 50   Cervical Extension 48   Cervical - Right Side Bend 35   Cervical - Left Side Bend 45   Cervical - Right Rotation 50   Cervical - Left Rotation 55                     [facility] Adult PT Treatment/Exercise - 04/07/15 1348    Neck Exercises: Seated   Neck Retraction 10 reps   Other Seated Exercise scap retract x 10   Neck Exercises: Supine   Neck Retraction 10 reps;20 reps   Neck Retraction Limitations 10 reps with towel roll   Other Supine Exercise supine cervical stab including chin tuck with arm circles, horizontal abduction and alternating flexion and extension x 10 each   Other Supine Exercise Supine scap stab with yellow band horizontal abduction, pullovers, ER, sash x 10 each   Modalities   Modalities Moist Heat   Moist Heat  Therapy   Number Minutes Moist Heat 15 Minutes   Moist Heat Location Cervical   Manual Therapy   Manual Therapy Soft tissue mobilization;Passive ROM   Soft tissue mobilization cervical paraspinals, suboccipitals, upper traps   Passive ROM side bend, rotation, distraction   Neck Exercises: Stretches   Upper Trapezius Stretch 3 reps;30 seconds                PT Education - 04/07/15 1413    Education provided Yes   Education Details Upper trap stretch and supine cervical stab series with arm circles, horiz abdct and alt flex/ext   Person(s) Educated Patient   Methods Explanation;Handout   Comprehension Verbalized understanding          PT Short Term Goals - 04/07/15 1355    PT SHORT TERM GOAL #1   Title The patient will demonstrate improved sitting postural correction 04/16/15   Period Weeks   Status Achieved   PT SHORT TERM GOAL #2   Title The patient will have improved cervical sidebending to 43 degrees needed for yard work, golf   Time 4   Period Weeks   Status Partially Met   PT SHORT TERM GOAL #3   Title Patient will report a 25% improvement in pain with home, yard and recreational activities   Time 4   Period Weeks   Status On-going           PT Long Term Goals - 04/07/15 1355    PT LONG TERM GOAL #1   Title The patient will be independent in safe, self progression of HEP for further improvements in pain, ROM and strength. 05/14/15   Time 8   Period Weeks   Status On-going   PT LONG TERM GOAL #2   Title The patient will have improved deep cervical flexor and extensor muscle strength and periscapular strength to 4+/5 needed for greater ease with yardwork, walking the dog and golf. 05/14/15   Time 8   Period Weeks   Status On-going   PT LONG TERM GOAL #3   Title The patient will have improved cervical extension to 60 degrees and rotation to 45 degrees for greater ease for driving and yard activities. 05/14/15   Time 8   Period Weeks   Status Partially Met    PT LONG TERM GOAL #4   Title Patient will report an overall improvement in pain and function > 60% with ADLs.  05/14/15   Time 8   Period Weeks   Status On-going  PT LONG TERM GOAL #5   Title FOTO functional outcome score improved to 32% indicating improved function with less pain   Time 8   Period Weeks   Status On-going               Plan - 04/07/15 1333    Clinical Impression Statement Pt reports pain had been improving until he exasberated it using the Ab ex machine at the gym. His AROM has improved for side bend and rotation. His ROM goals are partially MET. Instruted pt in supine cervical and scap stab as well as used manual techniques to decrease pain.    PT Next Visit Plan dry needling right upper trap; review supine cervical stab and upper trap stretch;scapular retraction; cervical distraction;  heat/e-stim as needed;  upper trap inhibition taping        Problem List Patient Active Problem List   Diagnosis Date Noted  . Essential hypertension 07/04/2013  . Hyperlipidemia with target LDL less than 70 01/03/2013  . Sleep apnea   . Hypothyroidism   . Osteoarthritis of left knee 03/21/2011  . CAD in native artery -- status post CABG x3 (LIMA-LAD, SVG RPDA, SVG-OM) 08/01/1988    Dorene Ar, PTA 04/07/2015, 2:21 PM  [facility] [address] [place], [place], [address] Phone: [phone]   Fax:  [phone]  Name: BURAK ZERBE MRN: [id number] Date of Birth: 1940-09-25

## 2015-04-07 NOTE — Patient Instructions (Signed)
Flexibility: Upper Trapezius Stretch   Gently grasp right side of head while reaching behind back with other hand. Tilt head away until a gentle stretch is felt. Hold 30 seconds. Repeat 3  times per set. Do 1 sets per session. Do  2  sessions per day.  http://orth.exer.us/340   Copyright  VHI. All rights reserved.   

## 2015-04-09 ENCOUNTER — Ambulatory Visit: Payer: Commercial Managed Care - HMO | Attending: Family Medicine | Admitting: Physical Therapy

## 2015-04-09 DIAGNOSIS — M6281 Muscle weakness (generalized): Secondary | ICD-10-CM | POA: Insufficient documentation

## 2015-04-09 DIAGNOSIS — R293 Abnormal posture: Secondary | ICD-10-CM

## 2015-04-09 DIAGNOSIS — M542 Cervicalgia: Secondary | ICD-10-CM

## 2015-04-09 DIAGNOSIS — M436 Torticollis: Secondary | ICD-10-CM | POA: Insufficient documentation

## 2015-04-09 NOTE — Patient Instructions (Addendum)
Over Head Pull: Narrow Grip       On back, knees bent, feet flat, band across thighs, elbows straight but relaxed. Pull hands apart (start). Keeping elbows straight, bring arms up and over head, hands toward floor. Keep pull steady on band. Hold momentarily. Return slowly, keeping pull steady, back to start. Repeat _10__ times. Band color ___red___   Side Pull: Double Arm   On back, knees bent, feet flat. Arms perpendicular to body, shoulder level, elbows straight but relaxed. Pull arms out to sides, elbows straight. Resistance band comes across collarbones, hands toward floor. Hold momentarily. Slowly return to starting position. Repeat __10_ times. Band color _red____   Elmer Picker   On back, knees bent, feet flat, left hand on left hip, right hand above left. Pull right arm DIAGONALLY (hip to shoulder) across chest. Bring right arm along head toward floor. Hold momentarily. Slowly return to starting position. Repeat _10__ times. Do with left arm. Band color __red____   Shoulder Rotation: Double Arm   On back, knees bent, feet flat, elbows tucked at sides, bent 90, hands palms up. Pull hands apart and down toward floor, keeping elbows near sides. Hold momentarily. Slowly return to starting position. Repeat _8-10__ times. Band color ____red__    Trigger Point Dry Needling  . What is Trigger Point Dry Needling (DN)? o DN is a physical therapy technique used to treat muscle pain and dysfunction. Specifically, DN helps deactivate muscle trigger points (muscle knots).  o A thin filiform needle is used to penetrate the skin and stimulate the underlying trigger point. The goal is for a local twitch response (LTR) to occur and for the trigger point to relax. No medication of any kind is injected during the procedure.   . What Does Trigger Point Dry Needling Feel Like?  o The procedure feels different for each individual patient. Some patients report that they do not actually feel the needle enter  the skin and overall the process is not painful. Very mild bleeding may occur. However, many patients feel a deep cramping in the muscle in which the needle was inserted. This is the local twitch response.   [name] How Will I feel after the treatment? o Soreness is normal, and the onset of soreness may not occur for a few hours. Typically this soreness does not last longer than two days.  o Bruising is uncommon, however; ice can be used to decrease any possible bruising.  o In rare cases feeling tired or nauseous after the treatment is normal. In addition, your symptoms may get worse before they get better, this period will typically not last longer than 24 hours.   . What Can I do After My Treatment? o Increase your hydration by drinking more water for the next 24 hours. o You may place ice or heat on the areas treated that have become sore, however, do not use heat on inflamed or bruised areas. Heat often brings more relief post needling. o You can continue your regular activities, but vigorous activity is not recommended initially after the treatment for 24 hours. o DN is best combined with other physical therapy such as strengthening, stretching, and other therapies.

## 2015-04-09 NOTE — Therapy (Addendum)
[facility] [address] [place], [place], [address] Phone: [phone]   Fax:  [phone]  Physical Therapy Treatment/Discharge Summary  Patient Details  Name: [name] [name] MRN: [id number] Date of Birth: [birth year] Referring Provider: Dr. Blair Heys  Encounter Date: 04/09/2015      PT End of Session - 04/09/15 1449    Visit Number 3   Number of Visits 16   Date for PT Re-Evaluation 05/14/15   Authorization Type Medicare; G codes; 10th visit prog note; KX at visit 15   PT Start Time 1415   PT Stop Time 1505   PT Time Calculation (min) 50 min   Activity Tolerance Patient tolerated treatment well      Past Medical History  Diagnosis Date  . CAD in native artery 1990    Status post PCI to the RCA --> CABG in 2002:  . S/P CABG x 3 2002    LIMA-LAD, SVG-PDA, SVG-OM.  [name] Hypertension   . Hypothyroidism   . Hematuria     OCASIONAL HEMATURIA UNK CAUSE  . Arthritis   . PONV (postoperative nausea and vomiting)   . OSA (obstructive sleep apnea)     AHI-18.3/hr, AHI REM-3.1/hr UNABLE TO ADJUST TO USING C-PAP  . Dyslipidemia, goal LDL below 70      on statin    Past Surgical History  Procedure Laterality Date  . Coronary artery bypass graft  Jan 2002    3 VESSELS 2002; LIMA-LAD, SVG-OM, SVG to RPDA.  [name] Cholecystectomy      1997  . Total knee arthroplasty  03/21/2011    Procedure: TOTAL KNEE ARTHROPLASTY;  Surgeon: Loanne Drilling;  Location: [facility];  Service: Orthopedics;  Laterality: Left;  . Joint replacement    . Total knee arthroplasty  06/01/2012    Procedure: TOTAL KNEE ARTHROPLASTY;  Surgeon: Loanne Drilling, MD;  Location: [facility];  Service: Orthopedics;  Laterality: Right;  . Cardiac catheterization  03/17/2010    Patent LIMA-LAD, small non-intervenable D1 with ostial 70% stenosis; patent SVG-OM with retrograde flow to the native circumflex & 90% stenosis in main Cx. 60-70% lesion in AVG Cx. Patent SVG-RPDA  with retrograde flow filling the PL system.  . Cardiac catheterization  03/28/2000    Recommended CABG --> CABG in Jan 2002  . Coronary angioplasty  12/02/1988    Proximal RCA 95% stenosis -- PTCA inflated with a 2.52mm balloon --> 3.0 mm Piccolino balloon resulting in a 20-30% irregularity; Distal RCA 90% stenosis --> 2.5 balloon results were normal  . Cardiac catheterization  10/21/1988    Severe disease in Mid and Distal RCA. Mid portion-90%, distal portion-80%  . Coronary angioplasty  08/04/1988    Distal RCA 95% occluded -> PTCA w/ 2.5 mm balloon -->.  30% irregularity  . Nm myoview ltd  September 2011    mild ischemia in basal inerolateral, mid inferolateral, and apical regions, post-stress EF 61%, Low-Risk Scan;    There were no vitals filed for this visit.  Visit Diagnosis:  Neck pain  Stiffness of cervical spine  Muscle weakness  Poor posture      Subjective Assessment - 04/09/15 1420    Subjective Straight ahead no pain, turning head to right is painful.  No overall change.     Currently in Pain? No/denies   Pain Score 0-No pain   Pain Location Neck   Pain Orientation Right   Pain Type Chronic pain   Pain Frequency Intermittent  Aggravating Factors  turning to the right, looking up                          [facility] Adult PT Treatment/Exercise - 04/09/15 0001    Neck Exercises: Supine   Other Supine Exercise supine scapular stabilization series:  10x each overhead , HABD, ER, sash red band   Modalities   Modalities Moist Heat   Moist Heat Therapy   Number Minutes Moist Heat 10 Minutes   Moist Heat Location Cervical   Manual Therapy   Manual Therapy Manual Traction;Muscle Energy Technique   Manual therapy comments upper trap contract/relax 3x 5 sec right   Soft tissue mobilization cervical paraspinals, suboccipitals, upper traps   Manual Traction 3x 20 sec          Trigger Point Dry Needling - 04/09/15 1457    Consent Given? Yes   Muscles  Treated Upper Body Upper trapezius;Levator scapulae  right cervical multifidi   Upper Trapezius Response Twitch reponse elicited;Palpable increased muscle length   Levator Scapulae Response Twitch response elicited;Palpable increased muscle length      Right only          PT Short Term Goals - 04/09/15 1556    PT SHORT TERM GOAL #1   Title The patient will demonstrate improved sitting postural correction 04/16/15   Status Achieved   PT SHORT TERM GOAL #2   Title The patient will have improved cervical sidebending to 43 degrees needed for yard work, golf   Time 4   Period Weeks   Status Partially Met   PT SHORT TERM GOAL #3   Title Patient will report a 25% improvement in pain with home, yard and recreational activities   Time 4   Period Weeks   Status On-going           PT Long Term Goals - 04/09/15 1556    PT LONG TERM GOAL #1   Title The patient will be independent in safe, self progression of HEP for further improvements in pain, ROM and strength. 05/14/15   Time 8   Period Weeks   Status On-going   PT LONG TERM GOAL #2   Title The patient will have improved deep cervical flexor and extensor muscle strength and periscapular strength to 4+/5 needed for greater ease with yardwork, walking the dog and golf. 05/14/15   Time 8   Period Weeks   Status On-going   PT LONG TERM GOAL #3   Title The patient will have improved cervical extension to 60 degrees and rotation to 45 degrees for greater ease for driving and yard activities. 05/14/15   Time 8   Period Weeks   Status Partially Met   PT LONG TERM GOAL #4   Title Patient will report an overall improvement in pain and function > 60% with ADLs.  05/14/15   Time 8   Period Weeks   Status On-going   PT LONG TERM GOAL #5   Title FOTO functional outcome score improved to 32% indicating improved function with less pain   Time 8   Period Weeks   Status On-going               Plan - 04/09/15 1458    Clinical  Impression Statement The patient reports no changes yet following 2 visits.    Thinks he may have "overdone" the upper trap stretch in HEP.    Discussed decreasing overpressure but longer  hold time.  He is receptive to dry needling and manual interventions with improved upper trap muscle length following.  Noted right scapula winging and elevation.  Initiated supine scapular stabilization.  Therapist closely monitoring response throughout treatment session.     PT Next Visit Plan assess response to dry needling;  continue manual techniques and cervical and scapular strengthening;  add prone head lift with shoulder extension      G code:  Carrying, moving and handling objects Goal CJ, Discharge Leon  Visits from Start of Care: 3  Current functional level related to goals / functional outcomes: The patient called on 04/20/15 stating he was "doing great" and cancelled all scheduled PT appointments.   Remaining deficits: See above.   Education / Equipment: HEP Plan: Patient agrees to discharge.  Patient goals were partially met. Patient is being discharged due to being pleased with the current functional level.  ?????       Problem List Patient Active Problem List   Diagnosis Date Noted  . Essential hypertension 07/04/2013  . Hyperlipidemia with target LDL less than 70 01/03/2013  . Sleep apnea   . Hypothyroidism   . Osteoarthritis of left knee 03/21/2011  . CAD in native artery -- status post CABG x3 (LIMA-LAD, SVG RPDA, SVG-OM) 08/01/1988    Alvera Singh 04/09/2015, 3:58 PM  [facility] [address] [place], [place], [address] Phone: [phone]   Fax:  [phone]  Name: [name] [name] MRN: [id number] Date of Birth: 10-03-1940   Ruben Im, PT 04/09/2015 3:58 PM Phone: [phone] Fax: [phone]

## 2015-04-14 ENCOUNTER — Encounter: Payer: Commercial Managed Care - HMO | Admitting: Physical Therapy

## 2015-04-16 ENCOUNTER — Encounter: Payer: Commercial Managed Care - HMO | Admitting: Physical Therapy

## 2015-04-21 ENCOUNTER — Ambulatory Visit: Payer: Commercial Managed Care - HMO | Admitting: Physical Therapy

## 2015-04-23 ENCOUNTER — Encounter: Payer: Commercial Managed Care - HMO | Admitting: Physical Therapy

## 2015-04-28 ENCOUNTER — Encounter: Payer: Commercial Managed Care - HMO | Admitting: Physical Therapy

## 2015-04-28 DIAGNOSIS — Z0001 Encounter for general adult medical examination with abnormal findings: Secondary | ICD-10-CM | POA: Diagnosis not present

## 2015-04-28 DIAGNOSIS — G4730 Sleep apnea, unspecified: Secondary | ICD-10-CM | POA: Diagnosis not present

## 2015-04-28 DIAGNOSIS — I119 Hypertensive heart disease without heart failure: Secondary | ICD-10-CM | POA: Diagnosis not present

## 2015-04-28 DIAGNOSIS — R972 Elevated prostate specific antigen [PSA]: Secondary | ICD-10-CM | POA: Diagnosis not present

## 2015-04-28 DIAGNOSIS — I2510 Atherosclerotic heart disease of native coronary artery without angina pectoris: Secondary | ICD-10-CM | POA: Diagnosis not present

## 2015-04-28 DIAGNOSIS — R319 Hematuria, unspecified: Secondary | ICD-10-CM | POA: Diagnosis not present

## 2015-04-28 DIAGNOSIS — E039 Hypothyroidism, unspecified: Secondary | ICD-10-CM | POA: Diagnosis not present

## 2015-04-28 DIAGNOSIS — E782 Mixed hyperlipidemia: Secondary | ICD-10-CM | POA: Diagnosis not present

## 2015-04-30 ENCOUNTER — Encounter: Payer: Commercial Managed Care - HMO | Admitting: Physical Therapy

## 2015-05-05 ENCOUNTER — Encounter: Payer: Commercial Managed Care - HMO | Admitting: Physical Therapy

## 2015-05-07 ENCOUNTER — Encounter: Payer: Commercial Managed Care - HMO | Admitting: Physical Therapy

## 2015-06-03 ENCOUNTER — Other Ambulatory Visit: Payer: Self-pay | Admitting: Cardiology

## 2015-06-03 ENCOUNTER — Telehealth: Payer: Self-pay | Admitting: Cardiology

## 2015-06-03 MED ORDER — METOPROLOL SUCCINATE ER 25 MG PO TB24: 25.0000 mg | ORAL_TABLET | ORAL | Status: DC

## 2015-06-03 MED ORDER — ATORVASTATIN CALCIUM 20 MG PO TABS: 20.0000 mg | ORAL_TABLET | Freq: Every day | ORAL | Status: DC

## 2015-06-03 NOTE — Telephone Encounter (Signed)
Refill sent to the pharmacy electronically.  

## 2015-06-03 NOTE — Telephone Encounter (Signed)
°*  STAT* If patient is at the pharmacy, call can be transferred to refill team.   1. Which medications need to be refilled? (please list name of each medication and dose if known) Metoprolol Succinate 25mg  and Atorvastatin 20mg    2. Which pharmacy/location (including street and city if local pharmacy) is medication to be sent to?[place]   3. Do they need a 30 day or 90 day supply? 90  Patient has spoken with [facility]

## 2015-06-16 ENCOUNTER — Encounter: Payer: Self-pay | Admitting: Cardiology

## 2015-06-16 ENCOUNTER — Ambulatory Visit (INDEPENDENT_AMBULATORY_CARE_PROVIDER_SITE_OTHER): Payer: Commercial Managed Care - HMO | Admitting: Cardiology

## 2015-06-16 VITALS — BP 156/72 | HR 64 | Ht 68.0 in | Wt 173.4 lb

## 2015-06-16 DIAGNOSIS — I2510 Atherosclerotic heart disease of native coronary artery without angina pectoris: Secondary | ICD-10-CM

## 2015-06-16 DIAGNOSIS — R002 Palpitations: Secondary | ICD-10-CM | POA: Diagnosis not present

## 2015-06-16 DIAGNOSIS — G4730 Sleep apnea, unspecified: Secondary | ICD-10-CM

## 2015-06-16 DIAGNOSIS — E785 Hyperlipidemia, unspecified: Secondary | ICD-10-CM

## 2015-06-16 DIAGNOSIS — I10 Essential (primary) hypertension: Secondary | ICD-10-CM | POA: Diagnosis not present

## 2015-06-16 NOTE — Progress Notes (Signed)
PCP: [name] Neer, [name]  Clinic Note: Chief Complaint  Patient presents with  . Follow-up    overdue--last seen 01/06/14//pt states he felt his heart racing about 2 weeks ago when he was walking, no other Sx.    HPI: [name] [name] is a 75 y.o. male with a PMH below who presents today for ~18 month f/u of CAD from 62.  [name] [name] was last seen on Jan 06, 2014.  Recent Hospitalizations: none  Studies Reviewed: none  Interval History: [name] [name] is doing well.  No major complaints.  He continues to be relatively active. With exception of an episode of rapid heartbeat that happened about 2-3 weeks ago, he has been doing well. He notes that he had a brief spell of heart racing for a few (~10) minutes (1st episode in a long time), otherwise does whatever he wants at the gym 3-4 d/weeik & walking dog a couple times a day. With all of his activities, he really denies any significant cardiac symptoms. Notably denies any anginal chest discomfort with rest or exertion. No PND, orthopnea or significant edema. On the one episode of palpitations, otherwise no palpitations, lightheadedness, dizziness, weakness or syncope/near syncope. No TIA/amaurosis fugax symptoms.  No claudication.  ROS: A comprehensive was performed. Review of Systems  Constitutional: Negative for malaise/fatigue.  HENT: Negative for nosebleeds.   Eyes: Negative for blurred vision.  Respiratory: Negative for cough, shortness of breath and wheezing.   Cardiovascular: Negative.        Per history of present illness  Gastrointestinal: Negative for heartburn, blood in stool and melena.  Genitourinary: Negative for hematuria and flank pain.  Musculoskeletal: Negative for myalgias and joint pain.  Neurological: Positive for dizziness (With recent episode of fast heart rate). Negative for sensory change, speech change, focal weakness, seizures, loss of consciousness, weakness and headaches.  Endo/Heme/Allergies: Does not  bruise/bleed easily (With recent episode of fast heart rate).  Psychiatric/Behavioral: Positive for hallucinations. Negative for depression and substance abuse.    Past Medical History  Diagnosis Date  . CAD in native artery 1990    Status post PCI to the RCA --> CABG in 2002:  . S/P CABG x 3 2002    LIMA-LAD, SVG-PDA, SVG-OM.  [name] Hypertension   . Hypothyroidism   . Hematuria     OCASIONAL HEMATURIA UNK CAUSE  . Arthritis   . PONV (postoperative nausea and vomiting)   . OSA (obstructive sleep apnea)     AHI-18.3/hr, AHI REM-3.1/hr UNABLE TO ADJUST TO USING C-PAP  . Dyslipidemia, goal LDL below 70      on statin    Past Surgical History  Procedure Laterality Date  . Coronary artery bypass graft  Jan 2002    3 VESSELS 2002; LIMA-LAD, SVG-OM, SVG to RPDA.  [name] Cholecystectomy      1997  . Total knee arthroplasty  03/21/2011    Procedure: TOTAL KNEE ARTHROPLASTY;  Surgeon: [name] [name];  Location: [facility];  Service: Orthopedics;  Laterality: Left;  . Joint replacement    . Total knee arthroplasty  06/01/2012    Procedure: TOTAL KNEE ARTHROPLASTY;  Surgeon: [name] [name];  Location: [facility];  Service: Orthopedics;  Laterality: Right;  . Cardiac catheterization  03/17/2010    Patent LIMA-LAD, small non-intervenable D1 with ostial 70% stenosis; patent SVG-OM with retrograde flow to the native circumflex & 90% stenosis in main Cx. 60-70% lesion in AVG Cx. Patent SVG-RPDA with retrograde flow filling the PL system.  [name]  Cardiac catheterization  03/28/2000    Recommended CABG --> CABG in Jan 2002  . Coronary angioplasty  12/02/1988    Proximal RCA 95% stenosis -- PTCA inflated with a 2.6mm balloon --> 3.0 mm Piccolino balloon resulting in a 20-30% irregularity; Distal RCA 90% stenosis --> 2.5 balloon results were normal  . Cardiac catheterization  10/21/1988    Severe disease in Mid and Distal RCA. Mid portion-90%, distal portion-80%  . Coronary angioplasty  08/04/1988    Distal RCA 95%  occluded -> PTCA w/ 2.5 mm balloon -->.  30% irregularity  . Nm myoview ltd  September 2011    mild ischemia in basal inerolateral, mid inferolateral, and apical regions, post-stress EF 61%, [name]-Risk Scan;   Prior to Admission medications   Medication Sig Start Date End Date Taking? Authorizing Provider  aspirin 81 MG tablet Take 81 mg by mouth daily.   Yes Historical Provider, [name]  atorvastatin (LIPITOR) 20 MG tablet Take 1 tablet (20 mg total) by mouth daily at 6 PM. 06/03/15  Yes [name] Man, [name]  Coenzyme Q10 (CO Q 10 PO) Take by mouth.   Yes Historical Provider, [name]  enalapril (VASOTEC) 20 MG tablet Take 1 tablet (20 mg total) by mouth at bedtime. 01/06/14  Yes [name] Man, [name]  levothyroxine (SYNTHROID, LEVOTHROID) 125 MCG tablet Take 125 mcg by mouth. AM BEFORE BREAKFAST   Yes Historical Provider, [name]  metoprolol succinate (TOPROL-XL) 25 MG 24 hr tablet Take 1 tablet (25 mg total) by mouth every morning. 06/03/15  Yes [name] Man, [name]  Multiple Vitamin (MULTIVITAMIN) tablet Take 1 tablet by mouth daily.   Yes Historical Provider, [name]  mupirocin ointment (BACTROBAN) 2 % Apply 1 application topically 3 (three) times daily. 04/04/14  Yes [name] Eagles, PA-C  Omega-3 Fatty Acids (FISH OIL PO) Take by mouth.   Yes Historical Provider, [name]   Allergies  Allergen Reactions  . Contrast Media [Iodinated Diagnostic Agents] Other (See Comments)    Unknown   . Crestor [Rosuvastatin]     SEVERE LEG CRAMPS  . Sulfa Antibiotics     REACTION WAS YRS AGO - SWELLING, SKIN REDNESS  . Bactrim [Sulfamethoxazole-Trimethoprim] Rash  . Doxycycline Rash    Social History   Social History  . Marital Status: Married    Spouse Name: N/A  . Number of Children: N/A  . Years of Education: N/A   Social History Main Topics  . Smoking status: Former Smoker    Quit date: 03/14/1971  . Smokeless tobacco: Never Used  . Alcohol Use: No  . Drug Use: No  . Sexual Activity: Not Asked   Other Topics Concern    . None   Social History Narrative   Married father of one, grandfather of 2. Now working out at the gym 3 days a week. Does not smoke and does not drink.    Family History  Problem Relation Age of Onset  . Heart disease Mother   . Heart disease Father   . Cancer Sister     Wt Readings from Last 3 Encounters:  06/16/15 173 lb 6.4 oz (78.654 kg)  04/06/14 176 lb 2 oz (79.89 kg)  04/04/14 175 lb 9.6 oz (79.652 kg)    PHYSICAL EXAM BP 156/72 mmHg  Pulse 64  Ht 5\' 8"  (1.727 m)  Wt 173 lb 6.4 oz (78.654 kg)  BMI 26.37 kg/m2 General appearance: alert, cooperative, appears stated age, no distress and Well-nourished and well-groomed.  Neck: no adenopathy, no  carotid bruit, no JVD and supple, symmetrical, trachea midline  Lungs: clear to auscultation bilaterally, normal percussion bilaterally and Nonlabored, good air movement  Heart: RRR. Normal S1 and S2. no R./G. There is a very soft 1/6 early peaking crescendo-decrescendo HSM at apex. Nondisplaced PMI. There was occasional ectopy Abdomen: soft, non-tender; bowel sounds normal; no masses, no organomegaly  Extremities: extremities normal, atraumatic, no cyanosis or edema, no edema, redness or tenderness in the calves or thighs and no ulcers, gangrene or trophic changes  Pulses: 2+ and symmetric  Neurologic: Alert and oriented X 3, normal strength and tone. Normal symmetric reflexes. Normal coordination and gait   Adult ECG Report  Rate: 64 ;  Rhythm: normal sinus rhythm and Left axis deviation (-43), RBBB, LVH with repolarization abnormality and QRS widening. Otherwise normal intervals and durations.;   Narrative Interpretation: Stable EKG with no sniffing changes.   Other studies Reviewed: Additional studies/ records that were reviewed today include:  Recent Labs:   No results found for: CHOL, HDL, LDLCALC, LDLDIRECT, TRIG, CHOLHDL   ASSESSMENT / PLAN: Problem List Items Addressed This Visit    Sleep apnea (Chronic)     Did not tolerate CPAP. I talked about the importance of doing something like his CPAP.      Palpitations    He will that one spell of fast heartbeat that lasted several minutes. Otherwise has not had any other episodes. If he does have any more I'm not able to figure out if we can ever document what is. Not worth it to wear a monitor. He is on a beta blocker. We discussed vagal maneuvers. If symptoms are prolonged, I asked that he come in and get EKG. If they have more frequently, we can potentially evaluate with an event monitor.  For now continue on the beta blocker. Reviewed vagal symptoms.      Relevant Orders   EKG 12-Lead (Completed)   Hyperlipidemia with target LDL less than 70 - Primary (Chronic)    Lipids look pretty good, but not at goal back in November. I don't have recent labs. He is on atorvastatin and omega-3 fatty acids.      Relevant Orders   EKG 12-Lead (Completed)   Essential hypertension (Chronic)    Blood pressure again is not very good here. He says at home is in much better range. For now to simply continue current dose of metoprolol and enalapril. We could potentially increase Toprol to 50, but his resting heart rate is 64.  If his BP continues to be elevated, consider probably adding HCTZ to the enalapril He is due to see his PCP, not defer management to PCP if necessary.      Relevant Orders   EKG 12-Lead (Completed)   CAD in native artery -- status post CABG x3 (LIMA-LAD, SVG RPDA, SVG-OM) (Chronic)    Doing well without any further angina. Given his history of false negative stress test in 2011, he is against doing any further stress tests in the absence of symptoms. I did explain to him that a false negative test for initial evaluation that resulted in having multivessel disease is less likely be false negative in the future now that he has been revascularized. Despite this, we decided to only go for symptoms.  Plan: Continue aspirin and statin along with  omega-3 fatty acid. Continue beta blocker at current dose along with ACE inhibitor.      Relevant Orders   EKG 12-Lead (Completed)  Current medicines are reviewed at length with the patient today. (+/- concerns) none The following changes have been made: None  IF YOU HAVE EPISODES OF RAPID HEART RATE- YOU MAY DO VAGAL MANEUVERS AS DISCUSSED  NO OTHER CHANGES WITH CURRENT MEDICATIONS   Your physician wants you to follow-up in 12 MONTHS WITH DR HARDING.   Studies Ordered:   Orders Placed This Encounter  Procedures  . EKG 12-Lead      [name] [name], M.D., M.S. Interventional Cardiologist   Pager # [phone] Phone # [phone] [address] [place], [place] [address]

## 2015-06-16 NOTE — Patient Instructions (Signed)
IF YOU HAVE EPISODES OF RAPID HEART RATE- YOU MAY DO VAGAL MANEUVERS AS DISCUSSED  NO OTHER CHANGES WITH CURRENT MEDICATIONS   Your physician wants you to follow-up in 12 MONTHS WITH DR HARDING.  You will receive a reminder letter in the mail two months in advance. If you don't receive a letter, please call our office to schedule the follow-up appointment.  If you need a refill on your cardiac medications before your next appointment, please call your pharmacy.

## 2015-06-18 DIAGNOSIS — R002 Palpitations: Secondary | ICD-10-CM | POA: Insufficient documentation

## 2015-06-18 NOTE — Assessment & Plan Note (Signed)
Doing well without any further angina. Given his history of false negative stress test in 2011, he is against doing any further stress tests in the absence of symptoms. I did explain to him that a false negative test for initial evaluation that resulted in having multivessel disease is less likely be false negative in the future now that he has been revascularized. Despite this, we decided to only go for symptoms.  Plan: Continue aspirin and statin along with omega-3 fatty acid. Continue beta blocker at current dose along with ACE inhibitor.

## 2015-06-18 NOTE — Assessment & Plan Note (Signed)
He will that one spell of fast heartbeat that lasted several minutes. Otherwise has not had any other episodes. If he does have any more I'm not able to figure out if we can ever document what is. Not worth it to wear a monitor. He is on a beta blocker. We discussed vagal maneuvers. If symptoms are prolonged, I asked that he come in and get EKG. If they have more frequently, we can potentially evaluate with an event monitor.  For now continue on the beta blocker. Reviewed vagal symptoms.

## 2015-06-18 NOTE — Assessment & Plan Note (Signed)
Did not tolerate CPAP. I talked about the importance of doing something like his CPAP.

## 2015-06-18 NOTE — Assessment & Plan Note (Signed)
Lipids look pretty good, but not at goal back in November. I don't have recent labs. He is on atorvastatin and omega-3 fatty acids.

## 2015-06-18 NOTE — Assessment & Plan Note (Addendum)
Blood pressure again is not very good here. He says at home is in much better range. For now to simply continue current dose of metoprolol and enalapril. We could potentially increase Toprol to 50, but his resting heart rate is 64.  If his BP continues to be elevated, consider probably adding HCTZ to the enalapril He is due to see his PCP, not defer management to PCP if necessary.

## 2015-07-30 ENCOUNTER — Other Ambulatory Visit: Payer: Self-pay | Admitting: Cardiology

## 2015-07-30 NOTE — Telephone Encounter (Signed)
Rx(s) sent to pharmacy electronically.  

## 2015-08-03 ENCOUNTER — Other Ambulatory Visit: Payer: Self-pay | Admitting: Cardiology

## 2015-08-10 DIAGNOSIS — C44629 Squamous cell carcinoma of skin of left upper limb, including shoulder: Secondary | ICD-10-CM | POA: Diagnosis not present

## 2015-09-14 DIAGNOSIS — C44629 Squamous cell carcinoma of skin of left upper limb, including shoulder: Secondary | ICD-10-CM | POA: Diagnosis not present

## 2015-09-14 DIAGNOSIS — L089 Local infection of the skin and subcutaneous tissue, unspecified: Secondary | ICD-10-CM | POA: Diagnosis not present

## 2015-09-14 DIAGNOSIS — Z85828 Personal history of other malignant neoplasm of skin: Secondary | ICD-10-CM | POA: Diagnosis not present

## 2015-10-30 DIAGNOSIS — E039 Hypothyroidism, unspecified: Secondary | ICD-10-CM | POA: Diagnosis not present

## 2015-10-30 DIAGNOSIS — I2510 Atherosclerotic heart disease of native coronary artery without angina pectoris: Secondary | ICD-10-CM | POA: Diagnosis not present

## 2015-10-30 DIAGNOSIS — E782 Mixed hyperlipidemia: Secondary | ICD-10-CM | POA: Diagnosis not present

## 2015-10-30 DIAGNOSIS — I119 Hypertensive heart disease without heart failure: Secondary | ICD-10-CM | POA: Diagnosis not present

## 2015-10-30 DIAGNOSIS — L570 Actinic keratosis: Secondary | ICD-10-CM | POA: Diagnosis not present

## 2015-12-22 DIAGNOSIS — H524 Presbyopia: Secondary | ICD-10-CM | POA: Diagnosis not present

## 2015-12-22 DIAGNOSIS — H5203 Hypermetropia, bilateral: Secondary | ICD-10-CM | POA: Diagnosis not present

## 2015-12-22 DIAGNOSIS — H52223 Regular astigmatism, bilateral: Secondary | ICD-10-CM | POA: Diagnosis not present

## 2016-01-07 DIAGNOSIS — L570 Actinic keratosis: Secondary | ICD-10-CM | POA: Diagnosis not present

## 2016-01-07 DIAGNOSIS — L820 Inflamed seborrheic keratosis: Secondary | ICD-10-CM | POA: Diagnosis not present

## 2016-01-07 DIAGNOSIS — Z85828 Personal history of other malignant neoplasm of skin: Secondary | ICD-10-CM | POA: Diagnosis not present

## 2016-01-07 DIAGNOSIS — L821 Other seborrheic keratosis: Secondary | ICD-10-CM | POA: Diagnosis not present

## 2016-01-07 DIAGNOSIS — C44319 Basal cell carcinoma of skin of other parts of face: Secondary | ICD-10-CM | POA: Diagnosis not present

## 2016-05-25 ENCOUNTER — Other Ambulatory Visit: Payer: Self-pay | Admitting: Cardiology

## 2016-05-27 NOTE — Telephone Encounter (Signed)
Rx has been sent to the pharmacy electronically. ° °

## 2016-06-20 ENCOUNTER — Telehealth: Payer: Self-pay | Admitting: Cardiology

## 2016-06-20 NOTE — Telephone Encounter (Signed)
Spoke w/ pt and informed him and informed him that reason he got the letter was b/c his recall information was input wrong. Pt verbalized understanding.

## 2016-06-20 NOTE — Telephone Encounter (Signed)
New message    Pt verbalized that he is calling to speak to rn because he received  Letter stating that he has a device and pt said that he do not have one  And he wants rn to call him because he is confused

## 2016-06-24 DIAGNOSIS — G4730 Sleep apnea, unspecified: Secondary | ICD-10-CM | POA: Diagnosis not present

## 2016-06-24 DIAGNOSIS — E782 Mixed hyperlipidemia: Secondary | ICD-10-CM | POA: Diagnosis not present

## 2016-06-24 DIAGNOSIS — Z0000 Encounter for general adult medical examination without abnormal findings: Secondary | ICD-10-CM | POA: Diagnosis not present

## 2016-06-24 DIAGNOSIS — M1990 Unspecified osteoarthritis, unspecified site: Secondary | ICD-10-CM | POA: Diagnosis not present

## 2016-06-24 DIAGNOSIS — I119 Hypertensive heart disease without heart failure: Secondary | ICD-10-CM | POA: Diagnosis not present

## 2016-06-24 DIAGNOSIS — R972 Elevated prostate specific antigen [PSA]: Secondary | ICD-10-CM | POA: Diagnosis not present

## 2016-06-24 DIAGNOSIS — R7301 Impaired fasting glucose: Secondary | ICD-10-CM | POA: Diagnosis not present

## 2016-06-24 DIAGNOSIS — I2510 Atherosclerotic heart disease of native coronary artery without angina pectoris: Secondary | ICD-10-CM | POA: Diagnosis not present

## 2016-06-24 DIAGNOSIS — E039 Hypothyroidism, unspecified: Secondary | ICD-10-CM | POA: Diagnosis not present

## 2016-07-08 ENCOUNTER — Ambulatory Visit (INDEPENDENT_AMBULATORY_CARE_PROVIDER_SITE_OTHER): Payer: Medicare HMO | Admitting: Cardiology

## 2016-07-08 ENCOUNTER — Encounter: Payer: Self-pay | Admitting: Cardiology

## 2016-07-08 VITALS — BP 156/82 | HR 65 | Ht 68.0 in | Wt 176.0 lb

## 2016-07-08 DIAGNOSIS — E785 Hyperlipidemia, unspecified: Secondary | ICD-10-CM | POA: Diagnosis not present

## 2016-07-08 DIAGNOSIS — R002 Palpitations: Secondary | ICD-10-CM | POA: Diagnosis not present

## 2016-07-08 DIAGNOSIS — I10 Essential (primary) hypertension: Secondary | ICD-10-CM | POA: Diagnosis not present

## 2016-07-08 DIAGNOSIS — I2510 Atherosclerotic heart disease of native coronary artery without angina pectoris: Secondary | ICD-10-CM

## 2016-07-08 DIAGNOSIS — G4733 Obstructive sleep apnea (adult) (pediatric): Secondary | ICD-10-CM

## 2016-07-08 NOTE — Progress Notes (Signed)
PCP: Mayra Neer, MD  Clinic Note: Chief Complaint  Patient presents with  . Follow-up    1 year  . Coronary Artery Disease    HPI: [name] [name] is a 76 y.o. male with a PMH below who presents today for Annual follow-up for CAD dating back to 53.[name] 95% proximal and 90% distal RCA lesion treated with PTCA only. He then had recurrent symptoms in 2001 and he ended up having bypass surgery. -RE-look Cath in 2011 CARDIAC CATHETERIZATION  03/17/2010  Patent LIMA-LAD, small non-intervenable D1 with ostial 70% stenosis; patent SVG-OM with retrograde flow to the native circumflex & 90% stenosis in main Cx. 60-70% lesion in AVG Cx. Patent SVG-RPDA with retrograde flow filling the PL system.   [name] [name] was last seen on 06/16/2015 at that time was 18 month follow-up visit. He was doing quite well without any major complaints was very active. Had a couple episodes of rapid heartbeats.  Recent Hospitalizations: None  Studies Reviewed: None  Interval History: [name] [name] returns today for his annual follow-up doing quite well without any major complaints. He is still very active, working out at the gym 3-4 d/weeik & walking dog a couple times a day. With all of his activities, he really denies any significant cardiac symptoms. Notably denies any anginal chest discomfort with rest or exertion. No resting or exertional dyspnea No PND, orthopnea or significant edema. He had one episode about 5 minutes refill his heart rate when up with exercise and failed to come back down normally. This the first time in about 6 months she's had one and has not had another occasion since. Probably only the first one since I last saw him. There is no associated symptom of syncope or near-syncope.  No TIA/amaurosis fugax symptoms. No melena, hematochezia, hematuria, or epstaxis. No claudication.  ROS: A comprehensive was performed. Review of Systems  Constitutional: Negative for malaise/fatigue.  Respiratory:  Negative for shortness of breath.   Gastrointestinal: Negative for blood in stool and melena.  Genitourinary: Negative for hematuria.  Musculoskeletal: Positive for myalgias (Occasional leg cramps at night).  Neurological: Negative for dizziness.  Psychiatric/Behavioral: Negative for depression and memory loss. The patient is not nervous/anxious and does not have insomnia.   All other systems reviewed and are negative.   Past Medical History:  Diagnosis Date  . Arthritis   . CAD in native artery 1990   Status post PCI to the RCA --> CABG in 2002:  [name] Dyslipidemia, goal LDL below 70     on statin  . Hematuria    OCASIONAL HEMATURIA UNK CAUSE  . Hypertension   . Hypothyroidism   . OSA (obstructive sleep apnea)    AHI-18.3/hr, AHI REM-3.1/hr UNABLE TO ADJUST TO USING C-PAP  . PONV (postoperative nausea and vomiting)   . S/P CABG x 3 2002   LIMA-LAD, SVG-PDA, SVG-OM.    Past Surgical History:  Procedure Laterality Date  . CARDIAC CATHETERIZATION  03/17/2010   Patent LIMA-LAD, small non-intervenable D1 with ostial 70% stenosis; patent SVG-OM with retrograde flow to the native circumflex & 90% stenosis in main Cx. 60-70% lesion in AVG Cx. Patent SVG-RPDA with retrograde flow filling the PL system.  [name] CARDIAC CATHETERIZATION  03/28/2000   Recommended CABG --> CABG in Jan 2002  . CARDIAC CATHETERIZATION  10/21/1988   Severe disease in Mid and Distal RCA. Mid portion-90%, distal portion-80%  . CHOLECYSTECTOMY     1997  . CORONARY ANGIOPLASTY  12/02/1988   Proximal RCA  95% stenosis -- PTCA inflated with a 2.49mm balloon --> 3.0 mm Piccolino balloon resulting in a 20-30% irregularity; Distal RCA 90% stenosis --> 2.5 balloon results were normal  . CORONARY ANGIOPLASTY  08/04/1988   Distal RCA 95% occluded -> PTCA w/ 2.5 mm balloon -->.  30% irregularity  . CORONARY ARTERY BYPASS GRAFT  Jan 2002   3 VESSELS 2002; LIMA-LAD, SVG-OM, SVG to RPDA.  [name] JOINT REPLACEMENT    . NM MYOVIEW LTD   September 2011   mild ischemia in basal inerolateral, mid inferolateral, and apical regions, post-stress EF 61%, [name]-Risk Scan;  . TOTAL KNEE ARTHROPLASTY  03/21/2011   Procedure: TOTAL KNEE ARTHROPLASTY;  Surgeon: Gearlean Alf;  Location: [facility];  Service: Orthopedics;  Laterality: Left;  . TOTAL KNEE ARTHROPLASTY  06/01/2012   Procedure: TOTAL KNEE ARTHROPLASTY;  Surgeon: Gearlean Alf, MD;  Location: [facility];  Service: Orthopedics;  Laterality: Right;    Current Meds  Medication Sig  . aspirin 81 MG tablet Take 81 mg by mouth daily.  [name] atorvastatin (LIPITOR) 20 MG tablet TAKE 1 TABLET EVERY DAY  AT  6PM  . Coenzyme Q10 (CO Q 10 PO) Take by mouth.  . enalapril (VASOTEC) 20 MG tablet Take 1 tablet (20 mg total) by mouth at bedtime.  [name] levothyroxine (SYNTHROID, LEVOTHROID) 125 MCG tablet Take 125 mcg by mouth. AM BEFORE BREAKFAST  . metoprolol succinate (TOPROL-XL) 25 MG 24 hr tablet TAKE 1 TABLET EVERY MORNING  . Multiple Vitamin (MULTIVITAMIN) tablet Take 1 tablet by mouth daily.  . Omega-3 Fatty Acids (FISH OIL PO) Take by mouth.  . [DISCONTINUED] mupirocin ointment (BACTROBAN) 2 % Apply 1 application topically 3 (three) times daily.    Allergies  Allergen Reactions  . Contrast Media [Iodinated Diagnostic Agents] Other (See Comments)    Unknown   . Crestor [Rosuvastatin]     SEVERE LEG CRAMPS  . Sulfa Antibiotics     REACTION WAS YRS AGO - SWELLING, SKIN REDNESS  . Bactrim [Sulfamethoxazole-Trimethoprim] Rash  . Doxycycline Rash    Social History   Social History  . Marital status: Married    Spouse name: N/A  . Number of children: N/A  . Years of education: N/A   Social History Main Topics  . Smoking status: Former Smoker    Quit date: 03/14/1971  . Smokeless tobacco: Never Used  . Alcohol use No  . Drug use: No  . Sexual activity: Not Asked   Other Topics Concern  . None   Social History Narrative   Married father of one, grandfather of 2. Now working out  at the gym 3 days a week. Does not smoke and does not drink.    family history includes Cancer in his sister; Heart disease in his father and mother.  Wt Readings from Last 3 Encounters:  07/08/16 79.8 kg (176 lb)  06/16/15 78.7 kg (173 lb 6.4 oz)  04/06/14 79.9 kg (176 lb 2 oz)    PHYSICAL EXAM BP (!) 156/82   Pulse 65   Ht 5\' 8"  (1.727 m)   Wt 79.8 kg (176 lb)   BMI 26.76 kg/m  General appearance: alert, cooperative, appears stated age, no distress and Well-nourished and well-groomed.  Neck: no adenopathy, no carotid bruit, no JVD and supple, symmetrical, trachea midline  Lungs: clear to auscultation bilaterally, normal percussion bilaterally and Nonlabored, good air movement  Heart: RRR. Normal S1 and S2. no R./G. There is a very soft 1/6 early peaking crescendo-decrescendo  HSM at apex. Nondisplaced PMI. There was occasional ectopy Abdomen: soft, non-tender; bowel sounds normal; no masses, no organomegaly  Extremities: extremities normal, atraumatic, no cyanosis or edema, no edema, redness or tenderness in the calves or thighs and no ulcers, gangrene or trophic changes  Pulses: 2+ and symmetric  Neurologic: Alert and oriented X 3, normal strength and tone. Normal symmetric reflexes. Normal coordination and gait    Adult ECG Report  Rate: 65 ;  Rhythm: normal sinus rhythm and Left axis deviation (-49). RBBB. Otherwise no significant change. Normal intervals and durations.;   Narrative Interpretation: Stable EKG   Other studies Reviewed: Additional studies/ records that were reviewed today include:  Recent Labs:  No results found for: CHOL, HDL, LDLCALC, LDLDIRECT, TRIG, CHOLHDL -- checked by PCP   ASSESSMENT / PLAN: Problem List Items Addressed This Visit    CAD in native artery -- status post CABG x3 (LIMA-LAD, SVG RPDA, SVG-OM) - Primary (Chronic)    Continues to do well without any recurrent anginal symptoms in all of his activities. He is on stable dose of beta  blocker and ACE inhibitor along with statin and aspirin.  Per previous plan, no intentions to proceed with additional stress test unless he has worsening symptoms.      Relevant Orders   EKG 12-Lead   Essential hypertension (Chronic)    Poorly controlled blood pressures here, but home he says they're in the 130/68 range. We may need to consider adding HCTZ or potentially add calcium channel blocker.  We'll defer to his PCP to continue to monitor.      Hyperlipidemia with target LDL less than 70 (Chronic)    I do not have recent labs. He is on atorvastatin and omega-3 fatty acids. Recommended goal of LDL less than 70 should be our target. He had an LDL of 90 back in 2015 MS last labs have seen on him. We may need to increase his atorvastatin dose versus potentially switch to Crestor. If not at goal then I think we need to consider additional therapy.      Palpitations    Only one episode lasting several minutes in the past year. These are very rare. We talked about vagal maneuvers as well as potentially to consider taking an extra dose of metoprolol.      Relevant Orders   EKG 12-Lead   Sleep apnea (Chronic)    Did not tolerate CPAP. At recommended he talk to his PCP about either doing home oxygen versus some different type of CPAP.         Current medicines are reviewed at length with the patient today. (+/- concerns) n/a The following changes have been made: n/a  Patient Instructions  No medication changes  Your physician wants you to follow-up in: 12 months with Dr. Ellyn Hack. You will receive a reminder letter in the mail two months in advance. If you don't receive a letter, please call our office to schedule the follow-up appointment.    Studies Ordered:   Orders Placed This Encounter  Procedures  . EKG 12-Lead      [name], M.D., M.S. Interventional Cardiologist   Pager # [phone] Phone # [phone] [address] [place], [place]  [address]

## 2016-07-08 NOTE — Patient Instructions (Signed)
No medication changes  Your physician wants you to follow-up in: 12 months with Dr. Ellyn Hack. You will receive a reminder letter in the mail two months in advance. If you don't receive a letter, please call our office to schedule the follow-up appointment.

## 2016-07-10 ENCOUNTER — Encounter: Payer: Self-pay | Admitting: Cardiology

## 2016-07-10 NOTE — Assessment & Plan Note (Signed)
I do not have recent labs. He is on atorvastatin and omega-3 fatty acids. Recommended goal of LDL less than 70 should be our target. He had an LDL of 90 back in 2015 MS last labs have seen on him. We may need to increase his atorvastatin dose versus potentially switch to Crestor. If not at goal then I think we need to consider additional therapy.

## 2016-07-10 NOTE — Assessment & Plan Note (Signed)
Only one episode lasting several minutes in the past year. These are very rare. We talked about vagal maneuvers as well as potentially to consider taking an extra dose of metoprolol.

## 2016-07-10 NOTE — Assessment & Plan Note (Signed)
Did not tolerate CPAP. At recommended he talk to his PCP about either doing home oxygen versus some different type of CPAP.

## 2016-07-10 NOTE — Assessment & Plan Note (Signed)
Continues to do well without any recurrent anginal symptoms in all of his activities. He is on stable dose of beta blocker and ACE inhibitor along with statin and aspirin.  Per previous plan, no intentions to proceed with additional stress test unless he has worsening symptoms.

## 2016-07-10 NOTE — Assessment & Plan Note (Signed)
Poorly controlled blood pressures here, but home he says they're in the 130/68 range. We may need to consider adding HCTZ or potentially add calcium channel blocker.  We'll defer to his PCP to continue to monitor.

## 2016-08-01 ENCOUNTER — Other Ambulatory Visit: Payer: Self-pay | Admitting: Cardiology

## 2016-08-02 NOTE — Telephone Encounter (Signed)
REFILL 

## 2016-10-20 ENCOUNTER — Inpatient Hospital Stay
Admit: 2016-10-20 | Discharge: 2016-10-20 | Disposition: A | Discharge status: Home / Self Care | Payer: MEDICARE | Attending: Internal Medicine

## 2016-10-20 DIAGNOSIS — M7121 Synovial cyst of popliteal space [Baker], right knee: Secondary | ICD-10-CM

## 2016-10-20 DIAGNOSIS — M79661 Pain in right lower leg: Secondary | ICD-10-CM | POA: Diagnosis not present

## 2016-10-20 DIAGNOSIS — M79604 Pain in right leg: Secondary | ICD-10-CM | POA: Diagnosis not present

## 2016-10-20 DIAGNOSIS — Z87891 Personal history of nicotine dependence: Secondary | ICD-10-CM | POA: Diagnosis not present

## 2016-10-20 DIAGNOSIS — M7989 Other specified soft tissue disorders: Secondary | ICD-10-CM | POA: Diagnosis not present

## 2016-10-20 LAB — METABOLIC PANEL, BASIC
Anion gap: 9 mmol/L (ref 3.0–18)
BUN/Creatinine ratio: 17 (ref 12–20)
BUN: 18 MG/DL (ref 7.0–18)
CO2: 27 mmol/L (ref 21–32)
Calcium: 9 MG/DL (ref 8.5–10.1)
Chloride: 103 mmol/L (ref 100–108)
Creatinine: 1.04 MG/DL (ref 0.6–1.3)
GFR est AA: >60 mL/min/{1.73_m2} (ref >60)
GFR est non-AA: >60 mL/min/{1.73_m2} (ref >60)
Glucose: 145 mg/dL — ABNORMAL HIGH (ref 74–99)
Potassium: 3.9 mmol/L (ref 3.5–5.5)
Sodium: 139 mmol/L (ref 136–145)

## 2016-10-20 LAB — CBC WITH AUTOMATED DIFF
ABS. BASOPHILS: 0 10*3/uL (ref 0.0–0.06)
ABS. EOSINOPHILS: 0.1 10*3/uL (ref 0.0–0.4)
ABS. LYMPHOCYTES: 0.8 10*3/uL — ABNORMAL LOW (ref 0.9–3.6)
ABS. MONOCYTES: 0.6 10*3/uL (ref 0.05–1.2)
ABS. NEUTROPHILS: 2.9 10*3/uL (ref 1.8–8.0)
BASOPHILS: 0 % (ref 0–2)
EOSINOPHILS: 2 % (ref 0–5)
HCT: 38.5 % (ref 36.0–48.0)
HGB: 13.3 g/dL (ref 13.0–16.0)
LYMPHOCYTES: 18 % — ABNORMAL LOW (ref 21–52)
MCH: 31.4 PG (ref 24.0–34.0)
MCHC: 34.5 g/dL (ref 31.0–37.0)
MCV: 90.8 FL (ref 74.0–97.0)
MONOCYTES: 14 % — ABNORMAL HIGH (ref 3–10)
MPV: 9.4 FL (ref 9.2–11.8)
NEUTROPHILS: 66 % (ref 40–73)
PLATELET: 182 10*3/uL (ref 135–420)
RBC: 4.24 M/uL — ABNORMAL LOW (ref 4.70–5.50)
RDW: 12.3 % (ref 11.6–14.5)
WBC: 4.4 10*3/uL — ABNORMAL LOW (ref 4.6–13.2)

## 2016-10-20 LAB — PTT: aPTT: 27.8 s (ref 23.0–36.4)

## 2016-10-20 LAB — PROTHROMBIN TIME + INR
INR: 1 (ref 0.8–1.2)
Prothrombin time: 12.7 s (ref 11.5–15.2)

## 2016-10-20 LAB — D DIMER: D DIMER: 1.04 ug/ml(FEU) — ABNORMAL HIGH (ref <0.46)

## 2016-10-20 LAB — D-DIMER, QUANTITATIVE: D-Dimer, Quant: 1.04 ug/ml(FEU) — ABNORMAL HIGH (ref <0.46)

## 2016-10-20 MED ORDER — ACETAMINOPHEN-CODEINE 300 MG-30 MG TAB
300-30 mg | ORAL_TABLET | Freq: Four times a day (QID) | ORAL | 0 refills | Status: AC | PRN
Start: 2016-10-20 — End: ?

## 2016-10-20 NOTE — ED Triage Notes (Signed)
Patient reports noticed R leg swelling yesterday. Patient reports that his calf is tender to touch

## 2016-10-20 NOTE — ED Notes (Signed)
Patient armband removed and shredded  I have reviewed discharge instructions with the patient.  The patient verbalized understanding.

## 2016-10-20 NOTE — ED Provider Notes (Signed)
EMERGENCY DEPARTMENT HISTORY AND PHYSICAL EXAM    Date: 10/20/2016  Patient Name: Nathan Delacruz.    History of Presenting Illness     Chief Complaint   Patient presents with   ??? Leg Swelling         History Provided By: Patient    Chief Complaint: Right leg swelling  Duration: 1 Days  Timing:  Acute  Location: Right leg  Quality: Swelling  Severity: 4 out of 10  Associated Symptoms: right calf tenderness (4/10) x 2-3 days    Additional History (Context):   9:44 AM  Nathan Delacruz. is a 76 y.o. male who presents to the emergency department C/O right leg swelling onset yesterday. Associated sxs include right calf tenderness (4/10) x 2-3 days. Denies Hx blood clots. Pt takes a baby Aspirin daily. Pt reports he drove for 4 hours recently. Pt had bypass surgery in 2001. Reports Hx of bilateral knee surgery. Pt denies chest pain, SOB, nausea, vomiting, diarrhea, constipation, abdominal pain, fever, chills, rash, known fall/injury, illicit drug use, EtOH use, tobacco use, and any other sxs or complaints.     PCP: Phys Other, MD    Current Outpatient Prescriptions   Medication Sig Dispense Refill   ??? levothyroxine (SYNTHROID) 125 mcg tablet Take  by mouth Daily (before breakfast).     ??? metoprolol succinate (TOPROL-XL) 25 mg XL tablet Take  by mouth daily.     ??? atorvastatin (LIPITOR) 20 mg tablet Take  by mouth daily.     ??? multivitamin (ONE A DAY) tablet Take 1 Tab by mouth daily.     ??? docosahexanoic acid/epa (FISH OIL PO) Take  by mouth.     ??? aspirin 81 mg chewable tablet Take 81 mg by mouth daily.     ??? triamcinolone acetonide (KENALOG) 0.1 % ointment Apply  to affected area two (2) times a day. use thin layer     ??? enalapril (VASOTEC) 20 mg tablet Take 20 mg by mouth daily.     ??? coenzyme q10 (CO Q-10) 10 mg cap Take  by mouth.     ??? acetaminophen-codeine (TYLENOL-CODEINE #3) 300-30 mg per tablet Take 1 Tab by mouth every six (6) hours as needed for Pain. Max Daily Amount: 4 Tabs. 12 Tab 0       Past History      Past Medical History:  Past Medical History:   Diagnosis Date   ??? Ill-defined condition     gallbladder removal       Past Surgical History:  Past Surgical History:   Procedure Laterality Date   ??? CARDIAC SURG PROCEDURE UNLIST      triple bypass   ??? HX ORTHOPAEDIC      bilat knee replacement       Family History:  History reviewed. No pertinent family history.    Social History:  Social History   Substance Use Topics   ??? Smoking status: Former Smoker   ??? Smokeless tobacco: Never Used   ??? Alcohol use No       Allergies:  Allergies   Allergen Reactions   ??? Sulfa (Sulfonamide Antibiotics) Hives         Review of Systems   Review of Systems   Constitutional: Negative for chills and fever.   Respiratory: Negative for shortness of breath.    Cardiovascular: Positive for leg swelling (right). Negative for chest pain.   Gastrointestinal: Negative for abdominal pain, constipation, diarrhea, nausea and vomiting.  Musculoskeletal: Positive for myalgias (right calf).   Skin: Negative for rash.   All other systems reviewed and are negative.      Physical Exam     Vitals:    10/20/16 0932   BP: 171/81   Pulse: 98   Resp: 18   Temp: 97.6 ??F (36.4 ??C)   SpO2: 99%   Weight: 77.1 kg (170 lb)   Height: 5\' 8"  (1.727 m)     Physical Exam   Constitutional: He is oriented to person, place, and time. He appears well-developed and well-nourished.   HENT:   Head: Normocephalic and atraumatic.   Nose: Nose normal.   Mouth/Throat: Oropharynx is clear and moist.   Eyes: Conjunctivae and EOM are normal. Pupils are equal, round, and reactive to light.   Neck: Normal range of motion. Neck supple. No JVD present. No tracheal deviation present. No thyromegaly present.   Cardiovascular: Normal rate, regular rhythm, normal heart sounds and intact distal pulses.    Pulses:       Dorsalis pedis pulses are 1+ on the right side, and 1+ on the left side.   Pulmonary/Chest: Effort normal and breath sounds normal.    Abdominal: Soft. Bowel sounds are normal. He exhibits no distension. There is no tenderness.   No HSM   Musculoskeletal: Normal range of motion. He exhibits edema. He exhibits no deformity.        Right lower leg: He exhibits tenderness and edema (trace).   Lymphadenopathy:     He has no cervical adenopathy.   Neurological: He is alert and oriented to person, place, and time. He has normal reflexes. No cranial nerve deficit. He exhibits normal muscle tone. Coordination normal.   No focal weakness   Skin: Skin is warm and dry. Abrasion (anterior right shin) noted.   Healed surgical scar to midline chest. Healed surgical scars to bilateral knees anteriorly.    Psychiatric: He has a normal mood and affect. His behavior is normal. Thought content normal.   Nursing note and vitals reviewed.        Diagnostic Study Results     Labs -     Recent Results (from the past 12 hour(s))   CBC WITH AUTOMATED DIFF    Collection Time: 10/20/16 10:07 AM   Result Value Ref Range    WBC 4.4 (L) 4.6 - 13.2 K/uL    RBC 4.24 (L) 4.70 - 5.50 M/uL    HGB 13.3 13.0 - 16.0 g/dL    HCT 29.538.5 62.136.0 - 30.848.0 %    MCV 90.8 74.0 - 97.0 FL    MCH 31.4 24.0 - 34.0 PG    MCHC 34.5 31.0 - 37.0 g/dL    RDW 65.712.3 84.611.6 - 96.214.5 %    PLATELET 182 135 - 420 K/uL    MPV 9.4 9.2 - 11.8 FL    NEUTROPHILS 66 40 - 73 %    LYMPHOCYTES 18 (L) 21 - 52 %    MONOCYTES 14 (H) 3 - 10 %    EOSINOPHILS 2 0 - 5 %    BASOPHILS 0 0 - 2 %    ABS. NEUTROPHILS 2.9 1.8 - 8.0 K/UL    ABS. LYMPHOCYTES 0.8 (L) 0.9 - 3.6 K/UL    ABS. MONOCYTES 0.6 0.05 - 1.2 K/UL    ABS. EOSINOPHILS 0.1 0.0 - 0.4 K/UL    ABS. BASOPHILS 0.0 0.0 - 0.06 K/UL    DF AUTOMATED     METABOLIC PANEL, BASIC  Collection Time: 10/20/16 10:07 AM   Result Value Ref Range    Sodium 139 136 - 145 mmol/L    Potassium 3.9 3.5 - 5.5 mmol/L    Chloride 103 100 - 108 mmol/L    CO2 27 21 - 32 mmol/L    Anion gap 9 3.0 - 18 mmol/L    Glucose 145 (H) 74 - 99 mg/dL    BUN 18 7.0 - 18 MG/DL    Creatinine 6.29 0.6 - 1.3 MG/DL     BUN/Creatinine ratio 17 12 - 20      GFR est AA >60 >60 ml/min/1.35m2    GFR est non-AA >60 >60 ml/min/1.101m2    Calcium 9.0 8.5 - 10.1 MG/DL   PROTHROMBIN TIME + INR    Collection Time: 10/20/16 10:07 AM   Result Value Ref Range    Prothrombin time 12.7 11.5 - 15.2 sec    INR 1.0 0.8 - 1.2     PTT    Collection Time: 10/20/16 10:07 AM   Result Value Ref Range    aPTT 27.8 23.0 - 36.4 SEC   D DIMER    Collection Time: 10/20/16 10:07 AM   Result Value Ref Range    D DIMER 1.04 (H) <0.46 ug/ml(FEU)       Radiologic Studies -   DUPLEX LOWER EXT VENOUS RIGHT   INTERPRETATION/FINDINGS  Duplex images were obtained using 2-D gray scale, color flow, and  spectral Doppler analysis.  Right leg :  1. Deep vein(s) visualized include the common femoral, deep femoral,  proximal femoral, mid femoral, distal femoral, popliteal(above knee),  popliteal(fossa), popliteal(below knee), posterior tibial and peroneal   veins.  2. No evidence of deep venous thrombosis detected in the veins  visualized.  3. No evidence of deep vein thrombosis in the contralateral common  femoral vein.  4. Superficial vein(s) visualized include the great saphenous vein.  5. No evidence of superficial thrombosis detected.     As read by the technologist.     CT Results  (Last 48 hours)    None        CXR Results  (Last 48 hours)    None          Medications given in the ED-  Medications - No data to display      Medical Decision Making   I am the first provider for this patient.    I reviewed the vital signs, available nursing notes, past medical history, past surgical history, family history and social history.    Vital Signs-Reviewed the patient's vital signs.    Pulse Oximetry Analysis - 99% on RA     Records Reviewed: Nursing Notes    Provider Notes (Medical Decision Making):   DDX: DVT, bakers cyst, doubt cellulitis, mass    Procedures:  Procedures    ED Course:   9:44 AM Initial assessment performed. The patients presenting problems  have been discussed, and they are in agreement with the care plan formulated and outlined with them.  I have encouraged them to ask questions as they arise throughout their visit.    12:05 PM Updated pt on all results. Pt is thankful for care and is ready to go home. Rest, ice and elevation instructions were given. Told pt he can up his Aspirin to BID.     Diagnosis and Disposition       DISCHARGE NOTE:  11:59 AM  Nathan Delacruz Jr.'s  results have been reviewed with him.  He has been counseled regarding his diagnosis, treatment, and plan.  He verbally conveys understanding and agreement of the signs, symptoms, diagnosis, treatment and prognosis and additionally agrees to follow up as discussed.  He also agrees with the care-plan and conveys that all of his questions have been answered.  I have also provided discharge instructions for him that include: educational information regarding their diagnosis and treatment, and list of reasons why they would want to return to the ED prior to their follow-up appointment, should his condition change. He has been provided with education for proper emergency department utilization.     CLINICAL IMPRESSION:    1. Baker's cyst, right    2. Right calf pain        PLAN:  1. D/C Home  2.   Current Discharge Medication List      START taking these medications    Details   acetaminophen-codeine (TYLENOL-CODEINE #3) 300-30 mg per tablet Take 1 Tab by mouth every six (6) hours as needed for Pain. Max Daily Amount: 4 Tabs.  Qty: 12 Tab, Refills: 0    Associated Diagnoses: Baker's cyst, right; Right calf pain           3.   Follow-up Information     Follow up With Details Comments Contact Info    Your PCP Schedule an appointment as soon as possible for a visit in 3 days For primary care follow up.     [facility] EMERGENCY DEPT Go to As needed, If symptoms worsen [address]  [place] [address]  [phone]        _______________________________    Attestations:   This note is prepared by Gentry Roch, acting as Scribe for Rich Brave, MD.    Rich Brave, MD:  The scribe's documentation has been prepared under my direction and personally reviewed by me in its entirety.  I confirm that the note above accurately reflects all work, treatment, procedures, and medical decision making performed by me.  _______________________________

## 2016-10-20 NOTE — Procedures (Signed)
[facility]  *** FINAL REPORT ***    Name: Nathan Delacruz, Nathan Delacruz  MRN: [id number]    Outpatient  DOB: 03 Apr 1941  HIS Order #: [id number]  TRAKnet Visit #: [id number]  Date: 20 Oct 2016    TYPE OF TEST: Peripheral Venous Testing    REASON FOR TEST  Pain in limb, Limb swelling    Right Leg:-  Deep venous thrombosis:           No  Superficial venous thrombosis:    No  Deep venous insufficiency:        Not examined  Superficial venous insufficiency: Not examined      INTERPRETATION/FINDINGS  Duplex images were obtained using 2-D gray scale, color flow, and  spectral Doppler analysis.  Right leg :  1. Deep vein(s) visualized include the common femoral, deep femoral,  proximal femoral, mid femoral, distal femoral, popliteal(above knee),  popliteal(fossa), popliteal(below knee), posterior tibial and peroneal   veins.  2. No evidence of deep venous thrombosis detected in the veins  visualized.  3. No evidence of deep vein thrombosis in the contralateral common  femoral vein.  4. Superficial vein(s) visualized include the great saphenous vein.  5. No evidence of superficial thrombosis detected.    ADDITIONAL COMMENTS  INCIDENTAL FINDINGS:  1. A fluid collection was noted in the popliteal fossa of the right  lower extremity measuring 3.3 cm x 1.2 cm x 5.6 cm. This likely  repreents a Baker's cyst.    I have personally reviewed the data relevant to the interpretation of  this  study.    TECHNOLOGIST: Jarobvey Matthews, RDCS, RVT  Signed: 10/20/2016 11:27 AM    PHYSICIAN: Legrande Hao J. Javayah Magaw, MD  Signed: 10/20/2016 03:48 PM

## 2016-10-20 NOTE — Procedures (Signed)
[facility]  *** FINAL REPORT ***    Name: [name], Nathan Delacruz  MRN: [id number]    Outpatient  DOB: 03 Apr 1941  HIS Order #: [id number]  TRAKnet Visit #: [id number]  Date: 20 Oct 2016    TYPE OF TEST: Peripheral Venous Testing    REASON FOR TEST  Pain in limb, Limb swelling    Right Leg:-  Deep venous thrombosis:           No  Superficial venous thrombosis:    No  Deep venous insufficiency:        Not examined  Superficial venous insufficiency: Not examined      INTERPRETATION/FINDINGS  Duplex images were obtained using 2-D gray scale, color flow, and  spectral Doppler analysis.  Right leg :  1. Deep vein(s) visualized include the common femoral, deep femoral,  proximal femoral, mid femoral, distal femoral, popliteal(above knee),  popliteal(fossa), popliteal(below knee), posterior tibial and peroneal   veins.  2. No evidence of deep venous thrombosis detected in the veins  visualized.  3. No evidence of deep vein thrombosis in the contralateral common  femoral vein.  4. Superficial vein(s) visualized include the great saphenous vein.  5. No evidence of superficial thrombosis detected.    ADDITIONAL COMMENTS  INCIDENTAL FINDINGS:  1. A fluid collection was noted in the popliteal fossa of the right  lower extremity measuring 3.3 cm x 1.2 cm x 5.6 cm. This likely  repreents a Baker's cyst.    I have personally reviewed the data relevant to the interpretation of  this  study.    TECHNOLOGIST: Susy FrizzleJarobvey Matthews, RDCS, RVT  Signed: 10/20/2016 11:27 AM    PHYSICIAN: Nolon BussingMichael J. Roseanne RenoStewart, MD  Signed: 10/20/2016 03:48 PM

## 2016-11-24 DIAGNOSIS — M7989 Other specified soft tissue disorders: Secondary | ICD-10-CM | POA: Diagnosis not present

## 2016-11-24 DIAGNOSIS — M79661 Pain in right lower leg: Secondary | ICD-10-CM | POA: Diagnosis not present

## 2017-01-11 DIAGNOSIS — E782 Mixed hyperlipidemia: Secondary | ICD-10-CM | POA: Diagnosis not present

## 2017-01-11 DIAGNOSIS — Z23 Encounter for immunization: Secondary | ICD-10-CM | POA: Diagnosis not present

## 2017-01-11 DIAGNOSIS — I119 Hypertensive heart disease without heart failure: Secondary | ICD-10-CM | POA: Diagnosis not present

## 2017-01-11 DIAGNOSIS — R7301 Impaired fasting glucose: Secondary | ICD-10-CM | POA: Diagnosis not present

## 2017-01-11 DIAGNOSIS — I2510 Atherosclerotic heart disease of native coronary artery without angina pectoris: Secondary | ICD-10-CM | POA: Diagnosis not present

## 2017-01-11 DIAGNOSIS — E039 Hypothyroidism, unspecified: Secondary | ICD-10-CM | POA: Diagnosis not present

## 2017-04-14 DIAGNOSIS — H524 Presbyopia: Secondary | ICD-10-CM | POA: Diagnosis not present

## 2017-04-14 DIAGNOSIS — H2512 Age-related nuclear cataract, left eye: Secondary | ICD-10-CM | POA: Diagnosis not present

## 2017-04-14 DIAGNOSIS — H5203 Hypermetropia, bilateral: Secondary | ICD-10-CM | POA: Diagnosis not present

## 2017-04-14 DIAGNOSIS — H52223 Regular astigmatism, bilateral: Secondary | ICD-10-CM | POA: Diagnosis not present

## 2017-05-22 ENCOUNTER — Other Ambulatory Visit: Payer: Self-pay | Admitting: Cardiology

## 2017-07-10 ENCOUNTER — Ambulatory Visit: Payer: Medicare HMO | Admitting: Cardiology

## 2017-07-10 ENCOUNTER — Encounter: Payer: Self-pay | Admitting: Cardiology

## 2017-07-10 VITALS — BP 163/79 | HR 62 | Ht 68.0 in | Wt 173.2 lb

## 2017-07-10 DIAGNOSIS — R002 Palpitations: Secondary | ICD-10-CM | POA: Diagnosis not present

## 2017-07-10 DIAGNOSIS — I2510 Atherosclerotic heart disease of native coronary artery without angina pectoris: Secondary | ICD-10-CM

## 2017-07-10 DIAGNOSIS — E785 Hyperlipidemia, unspecified: Secondary | ICD-10-CM | POA: Diagnosis not present

## 2017-07-10 DIAGNOSIS — I10 Essential (primary) hypertension: Secondary | ICD-10-CM | POA: Diagnosis not present

## 2017-07-10 NOTE — Patient Instructions (Signed)
NO CHANGE WITH CURRENT MEDICATIONS    Your physician wants you to follow-up in [place]. You will receive a reminder letter in the mail two months in advance. If you don't receive a letter, please call our office to schedule the follow-up appointment.    If you need a refill on your cardiac medications before your next appointment, please call your pharmacy.

## 2017-07-10 NOTE — Progress Notes (Signed)
PCP: [name] Neer, MD  Clinic Note: Chief Complaint  Patient presents with  . Follow-up    12 months; Pt states no Sx.   . Coronary Artery Disease    No angina  . Palpitations    2-3 episodes of fast heartbeats    HPI: [name] [name] is a 77 y.o. male with a PMH below who presents today for Annual follow-up for CAD dating back to 89.[name] 95% proximal and 90% distal RCA lesion treated with PTCA only. [name] [name] then had recurrent symptoms in 2001 and [name] [name] ended up having bypass surgery. -RE-look Cath in 2011 CARDIAC CATHETERIZATION  03/17/2010  Patent LIMA-LAD, small non-intervenable D1 with ostial 70% stenosis; patent SVG-OM with retrograde flow to the native circumflex & 90% stenosis in main Cx. 60-70% lesion in AVG Cx. Patent SVG-RPDA with retrograde flow filling the PL system.   [name] [name] was last seen on July 08, 2016.  [name] [name] is doing quite well.  Still very active going to the gym, walking the dog 3-4 days a week several times a day.  Denies any real cardiac symptoms.  1 or 2 short episodes of fast heartbeats.  No med changes  Recent Hospitalizations: None  Studies Reviewed: None  Interval History: [name] [name] returns today for [name] [name] annual follow-up doing quite well without any major complaints.  [name] [name] continues to remain active with [name] [name] gym exercises 3 days a week.  [name] [name] then walks [name] [name] dog about 2-3 times a day every day now.  With all of [name] [name] activity, [name] [name] denies any chest tightness or pressure with rest or exertion.  [name] [name] only gets dyspneic if [name] [name] really rushes and goes fast or up a hill.  Not with routine activity or routine exertion. [name] [name] denies any PND, orthopnea or edema. ->  [name] [name] was traveling  recently and noted having significant swelling in [name] [name] right leg, [name] [name] went to the local hospital and they look for DVT.  Ended up finding a Baker's cyst.  That has subsequently gotten better and [name] [name] has had no further swelling. In the last time she has seen him [name] [name] is noted maybe 2 or 3 episodes of heart racing  that [name] [name] was able to break very quickly with cough or Valsalva maneuvers.  Nothing lasted more than 5 minutes.  [name] [name] is a little bit taken to back at the blood pressure reading today, stating that this morning at home when [name] [name] was 130/70 and yesterday was 109/70 (of course that was after exertion).  [name] [name] does not think that that is really a true blood pressure reading for him.  [name] [name] denies any syncope/near syncope or TIA/amaurosis fugax symptoms.  Besides the short episodes noted above, no further tachycardia spells or baseline palpitations. No claudication  ROS: A comprehensive was performed. Review of Systems  Constitutional: Negative for malaise/fatigue.  HENT: Negative for nosebleeds.   Respiratory: Negative for shortness of breath.   Gastrointestinal: Negative for blood in stool and melena.  Genitourinary: Negative for hematuria.  Musculoskeletal: Positive for myalgias (Occasional leg cramps at night).  Neurological: Negative for dizziness.  Psychiatric/Behavioral: Negative for depression and memory loss. The patient is not nervous/anxious and does not have insomnia.   All other systems reviewed and are negative.   Past Medical History:  Diagnosis Date  . Arthritis   . CAD in native artery 1990   Status post PCI to the RCA --> CABG in 2002:  [name] Dyslipidemia, goal LDL below 70     on statin  . Hematuria  OCASIONAL HEMATURIA UNK CAUSE  . Hypertension   . Hypothyroidism   . OSA (obstructive sleep apnea)    AHI-18.3/hr, AHI REM-3.1/hr UNABLE TO ADJUST TO USING C-PAP  . PONV (postoperative nausea and vomiting)   . S/P CABG x 3 2002   LIMA-LAD, SVG-PDA, SVG-OM.    Past Surgical History:  Procedure Laterality Date  . CARDIAC CATHETERIZATION  03/17/2010   Patent LIMA-LAD, small non-intervenable D1 with ostial 70% stenosis; patent SVG-OM with retrograde flow to the native circumflex & 90% stenosis in main Cx. 60-70% lesion in AVG Cx. Patent SVG-RPDA with retrograde flow filling the PL  system.  [name] CARDIAC CATHETERIZATION  03/28/2000   Recommended CABG --> CABG in Jan 2002  . CARDIAC CATHETERIZATION  10/21/1988   Severe disease in Mid and Distal RCA. Mid portion-90%, distal portion-80%  . CHOLECYSTECTOMY     1997  . CORONARY ANGIOPLASTY  12/02/1988   Proximal RCA 95% stenosis -- PTCA inflated with a 2.54mm balloon --> 3.0 mm Piccolino balloon resulting in a 20-30% irregularity; Distal RCA 90% stenosis --> 2.5 balloon results were normal  . CORONARY ANGIOPLASTY  08/04/1988   Distal RCA 95% occluded -> PTCA w/ 2.5 mm balloon -->.  30% irregularity  . CORONARY ARTERY BYPASS GRAFT  Jan 2002   3 VESSELS 2002; LIMA-LAD, SVG-OM, SVG to RPDA.  [name] JOINT REPLACEMENT    . NM MYOVIEW LTD  September 2011   mild ischemia in basal inerolateral, mid inferolateral, and apical regions, post-stress EF 61%, [name]-Risk Scan;  . TOTAL KNEE ARTHROPLASTY  03/21/2011   Procedure: TOTAL KNEE ARTHROPLASTY;  Surgeon: Gearlean Alf;  Location: [facility];  Service: Orthopedics;  Laterality: Left;  . TOTAL KNEE ARTHROPLASTY  06/01/2012   Procedure: TOTAL KNEE ARTHROPLASTY;  Surgeon: Gearlean Alf, MD;  Location: [facility];  Service: Orthopedics;  Laterality: Right;    No outpatient medications have been marked as taking for the 07/10/17 encounter (Office Visit) with Leonie Man, MD.   Current Outpatient Medications on File Prior to Visit  Medication Sig Dispense Refill  . aspirin 81 MG tablet Take 81 mg by mouth daily.    [name] atorvastatin (LIPITOR) 20 MG tablet TAKE 1 TABLET EVERY DAY  AT  6PM 90 tablet 3  . Coenzyme Q10 (CO Q 10 PO) Take by mouth.    . enalapril (VASOTEC) 20 MG tablet Take 1 tablet (20 mg total) by mouth at bedtime. 90 tablet 3  . levothyroxine (SYNTHROID, LEVOTHROID) 125 MCG tablet Take 125 mcg by mouth. AM BEFORE BREAKFAST    . metoprolol succinate (TOPROL-XL) 25 MG 24 hr tablet TAKE 1 TABLET EVERY MORNING 90 tablet 3  . Multiple Vitamin (MULTIVITAMIN) tablet Take 1 tablet by mouth  daily.    . Omega-3 Fatty Acids (FISH OIL PO) Take by mouth.     No current facility-administered medications on file prior to visit.      Allergies  Allergen Reactions  . Contrast Media [Iodinated Diagnostic Agents] Other (See Comments)    Unknown   . Crestor [Rosuvastatin]     SEVERE LEG CRAMPS  . Sulfa Antibiotics     REACTION WAS YRS AGO - SWELLING, SKIN REDNESS  . Bactrim [Sulfamethoxazole-Trimethoprim] Rash  . Doxycycline Rash    Social History   Socioeconomic History  . Marital status: Married    Spouse name: None  . Number of children: None  . Years of education: None  . Highest education level: None  Social Needs  . Emergency planning/management officer  strain: None  . Food insecurity - worry: None  . Food insecurity - inability: None  . Transportation needs - medical: None  . Transportation needs - non-medical: None  Occupational History  . None  Tobacco Use  . Smoking status: Former Smoker    Last attempt to quit: 03/14/1971    Years since quitting: 46.3  . Smokeless tobacco: Never Used  Substance and Sexual Activity  . Alcohol use: No  . Drug use: No  . Sexual activity: None  Other Topics Concern  . None  Social History Narrative   Married father of one, grandfather of 2. Now working out at the gym 3 days a week. Does not smoke and does not drink.    family history includes Cancer in [name] [name] sister; Heart disease in [name] [name] father and mother.  Wt Readings from Last 3 Encounters:  07/10/17 173 lb 3.2 oz (78.6 kg)  07/08/16 176 lb (79.8 kg)  06/16/15 173 lb 6.4 oz (78.7 kg)    PHYSICAL EXAM BP (!) 163/79   Pulse 62   Ht 5\' 8"  (1.727 m)   Wt 173 lb 3.2 oz (78.6 kg)   BMI 26.33 kg/m   Physical Exam  Constitutional: [name] [name] is oriented to person, place, and time. [name] [name] appears well-developed and well-nourished. No distress.  Well-groomed  HENT:  Head: Normocephalic and atraumatic.  Neck: Normal range of motion. Neck supple. No hepatojugular reflux and no JVD present. Carotid  bruit is not present.  Cardiovascular: Normal rate, regular rhythm, intact distal pulses and normal pulses.  No extrasystoles are present. PMI is not displaced. Exam reveals no gallop and no friction rub.  Murmur heard.  Medium-pitched harsh crescendo-decrescendo early systolic murmur is present with a grade of 1/6 at the upper right sternal border radiating to the neck. Pulmonary/Chest: Effort normal and breath sounds normal. No respiratory distress. [name] [name] has no wheezes.  Abdominal: Soft. Bowel sounds are normal. [name] [name] exhibits no distension. There is no tenderness. There is no rebound.  Musculoskeletal: Normal range of motion. [name] [name] exhibits no edema.  Neurological: [name] [name] is alert and oriented to person, place, and time.  Psychiatric: [name] [name] has a normal mood and affect. [name] [name] behavior is normal. Judgment and thought content normal.  Nursing note and vitals reviewed.   Adult ECG Report  Rate: 62;  Rhythm: normal sinus rhythm and Left axis deviation/LAFB (-54). RBBB. Otherwise no significant change. Normal intervals and durations.;   Narrative Interpretation: Stable EKG   Other studies Reviewed: Additional studies/ records that were reviewed today include:  Recent Labs:  No results found for: CHOL, HDL, LDLCALC, LDLDIRECT, TRIG, CHOLHDL -- due to be checked by PCP this week   ASSESSMENT / PLAN: Problem List Items Addressed This Visit    CAD in native artery -- status post CABG x3 (LIMA-LAD, SVG RPDA, SVG-OM) - Primary (Chronic)    Still doing well with no active anginal symptoms.  No heart failure symptoms.  Remains on stable dose of Toprol and enalapril.  [name] [name] is also on aspirin and statin along with co-Q10.  As [name] [name] is stable, we will not make any changes.  Based on prior "false positive "stress test, [name] [name] is indicated that [name] [name] would not want further stress testing in the absence of symptoms.      Relevant Orders   EKG 12-Lead (Completed)   Essential hypertension (Chronic)    I have asked that [name] [name] keep a  blood pressure log in order to help followAs we may need to titrate up  medications.  Not a lot of room to go with either the 2 medications that [name] [name] is on and I would therefore require a new medication probably HCTZ or chlorthalidone.  Will defer to PCP who will see him more frequently.  I have asked that [name] [name] keep a blood pressure log and discuss with PCP      Relevant Orders   EKG 12-Lead (Completed)   Hyperlipidemia with target LDL less than 70 (Chronic)    Unfortunately, I have not seen labs in quite some time.  Supposedly been "well-controlled".  Remains on atorvastatin and CoQ10.  Would like to get labs from PCP.  [name] [name] will ask for them to be faxed to me.  Target LDL needs to be less than 70      Palpitations; ? short runs of SVT/PAT (Chronic)    Relatively rare episodes of short runs.  Usually well controlled with vagal maneuvers.  [name] [name] has not had any use additional dose of Toprol.  Overall seems controlled with current dose of Toprol.      Relevant Orders   EKG 12-Lead (Completed)     Keep BP log.   Current medicines are reviewed at length with the patient today. (+/- concerns) n/a The following changes have been made: n/a  Patient Instructions  NO CHANGE WITH CURRENT MEDICATIONS    Your physician wants you to follow-up in [place]. You will receive a reminder letter in the mail two months in advance. If you don't receive a letter, please call our office to schedule the follow-up appointment.    If you need a refill on your cardiac medications before your next appointment, please call your pharmacy.    Studies Ordered:   Orders Placed This Encounter  Procedures  . EKG 12-Lead      [name], M.D., M.S. Interventional Cardiologist   Pager # [phone] Phone # [phone] [address] [place], [place] [address]

## 2017-07-13 ENCOUNTER — Encounter: Payer: Self-pay | Admitting: Cardiology

## 2017-07-13 DIAGNOSIS — Z0000 Encounter for general adult medical examination without abnormal findings: Secondary | ICD-10-CM | POA: Diagnosis not present

## 2017-07-13 DIAGNOSIS — M1990 Unspecified osteoarthritis, unspecified site: Secondary | ICD-10-CM | POA: Diagnosis not present

## 2017-07-13 DIAGNOSIS — G4730 Sleep apnea, unspecified: Secondary | ICD-10-CM | POA: Diagnosis not present

## 2017-07-13 DIAGNOSIS — I2510 Atherosclerotic heart disease of native coronary artery without angina pectoris: Secondary | ICD-10-CM | POA: Diagnosis not present

## 2017-07-13 DIAGNOSIS — R7301 Impaired fasting glucose: Secondary | ICD-10-CM | POA: Diagnosis not present

## 2017-07-13 DIAGNOSIS — E039 Hypothyroidism, unspecified: Secondary | ICD-10-CM | POA: Diagnosis not present

## 2017-07-13 DIAGNOSIS — I119 Hypertensive heart disease without heart failure: Secondary | ICD-10-CM | POA: Diagnosis not present

## 2017-07-13 DIAGNOSIS — R972 Elevated prostate specific antigen [PSA]: Secondary | ICD-10-CM | POA: Diagnosis not present

## 2017-07-13 DIAGNOSIS — E782 Mixed hyperlipidemia: Secondary | ICD-10-CM | POA: Diagnosis not present

## 2017-07-13 NOTE — Assessment & Plan Note (Signed)
Still doing well with no active anginal symptoms.  No heart failure symptoms.  Remains on stable dose of Toprol and enalapril.  He is also on aspirin and statin along with co-Q10.  As he is stable, we will not make any changes.  Based on prior "false positive "stress test, he is indicated that he would not want further stress testing in the absence of symptoms.

## 2017-07-13 NOTE — Assessment & Plan Note (Addendum)
I have asked that he keep a blood pressure log in order to help followAs we may need to titrate up medications.  Not a lot of room to go with either the 2 medications that he is on and I would therefore require a new medication probably HCTZ or chlorthalidone.  Will defer to PCP who will see him more frequently.  I have asked that he keep a blood pressure log and discuss with PCP

## 2017-07-13 NOTE — Assessment & Plan Note (Addendum)
Relatively rare episodes of short runs.  Usually well controlled with vagal maneuvers.  He has not had any use additional dose of Toprol.  Overall seems controlled with current dose of Toprol.

## 2017-07-13 NOTE — Assessment & Plan Note (Signed)
Unfortunately, I have not seen labs in quite some time.  Supposedly been "well-controlled".  Remains on atorvastatin and CoQ10.  Would like to get labs from PCP.  He will ask for them to be faxed to me.  Target LDL needs to be less than 70

## 2017-10-18 DIAGNOSIS — G4733 Obstructive sleep apnea (adult) (pediatric): Secondary | ICD-10-CM | POA: Diagnosis not present

## 2017-10-19 DIAGNOSIS — R972 Elevated prostate specific antigen [PSA]: Secondary | ICD-10-CM | POA: Diagnosis not present

## 2017-11-20 DIAGNOSIS — G4733 Obstructive sleep apnea (adult) (pediatric): Secondary | ICD-10-CM | POA: Diagnosis not present

## 2017-11-29 DIAGNOSIS — G4733 Obstructive sleep apnea (adult) (pediatric): Secondary | ICD-10-CM | POA: Diagnosis not present

## 2017-12-30 DIAGNOSIS — G4733 Obstructive sleep apnea (adult) (pediatric): Secondary | ICD-10-CM | POA: Diagnosis not present

## 2018-01-17 DIAGNOSIS — E039 Hypothyroidism, unspecified: Secondary | ICD-10-CM | POA: Diagnosis not present

## 2018-01-17 DIAGNOSIS — I2510 Atherosclerotic heart disease of native coronary artery without angina pectoris: Secondary | ICD-10-CM | POA: Diagnosis not present

## 2018-01-17 DIAGNOSIS — G4733 Obstructive sleep apnea (adult) (pediatric): Secondary | ICD-10-CM | POA: Diagnosis not present

## 2018-01-17 DIAGNOSIS — Z23 Encounter for immunization: Secondary | ICD-10-CM | POA: Diagnosis not present

## 2018-01-17 DIAGNOSIS — R972 Elevated prostate specific antigen [PSA]: Secondary | ICD-10-CM | POA: Diagnosis not present

## 2018-01-17 DIAGNOSIS — I119 Hypertensive heart disease without heart failure: Secondary | ICD-10-CM | POA: Diagnosis not present

## 2018-01-17 DIAGNOSIS — R7301 Impaired fasting glucose: Secondary | ICD-10-CM | POA: Diagnosis not present

## 2018-01-17 DIAGNOSIS — E782 Mixed hyperlipidemia: Secondary | ICD-10-CM | POA: Diagnosis not present

## 2018-01-30 DIAGNOSIS — G4733 Obstructive sleep apnea (adult) (pediatric): Secondary | ICD-10-CM | POA: Diagnosis not present

## 2018-01-31 DIAGNOSIS — G4733 Obstructive sleep apnea (adult) (pediatric): Secondary | ICD-10-CM | POA: Diagnosis not present

## 2018-03-01 DIAGNOSIS — G4733 Obstructive sleep apnea (adult) (pediatric): Secondary | ICD-10-CM | POA: Diagnosis not present

## 2018-03-06 ENCOUNTER — Other Ambulatory Visit: Payer: Self-pay | Admitting: Cardiology

## 2018-04-01 DIAGNOSIS — G4733 Obstructive sleep apnea (adult) (pediatric): Secondary | ICD-10-CM | POA: Diagnosis not present

## 2018-04-23 DIAGNOSIS — H524 Presbyopia: Secondary | ICD-10-CM | POA: Diagnosis not present

## 2018-04-23 DIAGNOSIS — H5203 Hypermetropia, bilateral: Secondary | ICD-10-CM | POA: Diagnosis not present

## 2018-04-23 DIAGNOSIS — H2512 Age-related nuclear cataract, left eye: Secondary | ICD-10-CM | POA: Diagnosis not present

## 2018-04-23 DIAGNOSIS — H52223 Regular astigmatism, bilateral: Secondary | ICD-10-CM | POA: Diagnosis not present

## 2018-05-01 DIAGNOSIS — G4733 Obstructive sleep apnea (adult) (pediatric): Secondary | ICD-10-CM | POA: Diagnosis not present

## 2018-05-22 DIAGNOSIS — J069 Acute upper respiratory infection, unspecified: Secondary | ICD-10-CM | POA: Diagnosis not present

## 2018-05-25 DIAGNOSIS — G4733 Obstructive sleep apnea (adult) (pediatric): Secondary | ICD-10-CM | POA: Diagnosis not present

## 2018-06-01 DIAGNOSIS — G4733 Obstructive sleep apnea (adult) (pediatric): Secondary | ICD-10-CM | POA: Diagnosis not present

## 2018-07-02 DIAGNOSIS — G4733 Obstructive sleep apnea (adult) (pediatric): Secondary | ICD-10-CM | POA: Diagnosis not present

## 2018-07-06 DIAGNOSIS — G4733 Obstructive sleep apnea (adult) (pediatric): Secondary | ICD-10-CM | POA: Diagnosis not present

## 2018-07-31 DIAGNOSIS — G4733 Obstructive sleep apnea (adult) (pediatric): Secondary | ICD-10-CM | POA: Diagnosis not present

## 2018-08-03 ENCOUNTER — Ambulatory Visit: Payer: Medicare HMO | Admitting: Cardiology

## 2018-08-31 DIAGNOSIS — G4733 Obstructive sleep apnea (adult) (pediatric): Secondary | ICD-10-CM | POA: Diagnosis not present

## 2018-09-30 DIAGNOSIS — G4733 Obstructive sleep apnea (adult) (pediatric): Secondary | ICD-10-CM | POA: Diagnosis not present

## 2018-10-23 DIAGNOSIS — G4733 Obstructive sleep apnea (adult) (pediatric): Secondary | ICD-10-CM | POA: Diagnosis not present

## 2018-10-31 DIAGNOSIS — G4733 Obstructive sleep apnea (adult) (pediatric): Secondary | ICD-10-CM | POA: Diagnosis not present

## 2018-11-30 DIAGNOSIS — G4733 Obstructive sleep apnea (adult) (pediatric): Secondary | ICD-10-CM | POA: Diagnosis not present

## 2018-12-13 ENCOUNTER — Other Ambulatory Visit: Payer: Self-pay | Admitting: Physician Assistant

## 2018-12-13 ENCOUNTER — Ambulatory Visit
Admission: RE | Admit: 2018-12-13 | Discharge: 2018-12-13 | Disposition: A | Discharge status: Home / Self Care | Payer: Medicare HMO | Source: Ambulatory Visit | Attending: Physician Assistant | Admitting: Physician Assistant

## 2018-12-13 DIAGNOSIS — N182 Chronic kidney disease, stage 2 (mild): Secondary | ICD-10-CM | POA: Diagnosis not present

## 2018-12-13 DIAGNOSIS — I119 Hypertensive heart disease without heart failure: Secondary | ICD-10-CM | POA: Diagnosis not present

## 2018-12-13 DIAGNOSIS — M7989 Other specified soft tissue disorders: Secondary | ICD-10-CM

## 2018-12-13 DIAGNOSIS — E039 Hypothyroidism, unspecified: Secondary | ICD-10-CM | POA: Diagnosis not present

## 2018-12-13 DIAGNOSIS — R600 Localized edema: Secondary | ICD-10-CM | POA: Diagnosis not present

## 2018-12-13 DIAGNOSIS — M79669 Pain in unspecified lower leg: Secondary | ICD-10-CM | POA: Diagnosis not present

## 2018-12-13 DIAGNOSIS — M79662 Pain in left lower leg: Secondary | ICD-10-CM

## 2018-12-13 DIAGNOSIS — E782 Mixed hyperlipidemia: Secondary | ICD-10-CM | POA: Diagnosis not present

## 2018-12-14 ENCOUNTER — Other Ambulatory Visit: Payer: Self-pay | Admitting: Cardiology

## 2019-01-01 DIAGNOSIS — Z23 Encounter for immunization: Secondary | ICD-10-CM | POA: Diagnosis not present

## 2019-01-01 DIAGNOSIS — N182 Chronic kidney disease, stage 2 (mild): Secondary | ICD-10-CM | POA: Diagnosis not present

## 2019-01-01 DIAGNOSIS — R972 Elevated prostate specific antigen [PSA]: Secondary | ICD-10-CM | POA: Diagnosis not present

## 2019-01-07 ENCOUNTER — Ambulatory Visit (INDEPENDENT_AMBULATORY_CARE_PROVIDER_SITE_OTHER): Payer: Medicare HMO | Admitting: Cardiology

## 2019-01-07 ENCOUNTER — Encounter: Payer: Self-pay | Admitting: Cardiology

## 2019-01-07 ENCOUNTER — Other Ambulatory Visit: Payer: Self-pay

## 2019-01-07 VITALS — BP 146/80 | HR 59 | Temp 98.2°F | Ht 68.0 in | Wt 166.0 lb

## 2019-01-07 DIAGNOSIS — I2510 Atherosclerotic heart disease of native coronary artery without angina pectoris: Secondary | ICD-10-CM | POA: Diagnosis not present

## 2019-01-07 DIAGNOSIS — E785 Hyperlipidemia, unspecified: Secondary | ICD-10-CM

## 2019-01-07 DIAGNOSIS — R002 Palpitations: Secondary | ICD-10-CM | POA: Diagnosis not present

## 2019-01-07 DIAGNOSIS — I10 Essential (primary) hypertension: Secondary | ICD-10-CM

## 2019-01-07 NOTE — Patient Instructions (Addendum)
Medication Instructions:  NO CHANGES  If you need a refill on your cardiac medications before your next appointment, please call your pharmacy.   Lab work: NOT NEEDED   Testing/Procedures: Not needed  Follow-Up: At [facility], you and your health needs are our priority.  As part of our continuing mission to provide you with exceptional heart care, we have created designated Provider Care Teams.  These Care Teams include your primary Cardiologist (physician) and Advanced Practice Providers (APPs -  Physician Assistants and Nurse Practitioners) who all work together to provide you with the care you need, when you need it. . You will need a follow up appointment in 12 months- AUG 2020.  Please call our office 2 months in advance to schedule this appointment.  You may see [name], MD or one of the following Advanced Practice Providers on your designated Care Team:   . Rosaria Ferries, PA-C . Jory Sims, DNP, ANP  Any Other Special Instructions Will Be Listed Below (If Applicable).

## 2019-01-07 NOTE — Progress Notes (Signed)
PCP: [name] Neer, MD  Clinic Note: Chief Complaint  Patient presents with  . Follow-up    Delayed annual  . Coronary Artery Disease    HPI: [name] [name] is a 78 y.o. male with a PMH of CAD-CABG-PCI who presents today for Annual follow-up.    1990 => RCA PTCA -> 95% proximal and 90% distal RCA lesion treated with PTCA only.  He then had recurrent symptoms in 2001 and he ended up having bypass surgery.  RE-look Cath in 2011  [place]  03/17/2010  Patent LIMA-LAD, small non-intervenable D1 with ostial 70% stenosis; patent SVG-OM with retrograde flow to the native circumflex & 90% stenosis in main Cx. 60-70% lesion in AVG Cx. Patent SVG-RPDA with retrograde flow filling the PL system.   [name] [name] was last seen in ~ March 2019 (his annual follow-up visit was delayed because of COVID-19 cancellations during the initial quarantine phase)  Recent Hospitalizations: None  Studies Reviewed: None  Interval History: [name] [name] returns today feeling well.  Doing well.  No major complaints.  Still walks the dog ~3-4 x week, but unfortunately unable to go to the gym.  Does yard work - gardening.  Denies any chest pain or pressure with rest or exertion.  No exertional dyspnea unless he really pushes it uphill or upstairs.  He says he is had a few episodes off and on of pretty fast palpitations (usually out in the hot sun of the summer) that are pretty easily broken with coughing and cold water.  No lightheadedness or dizziness associated with it.  No syncope or near syncope.  No PND, orthopnea with trivial edema at the end of the day.  He has been doing quite a bit to try to lose weight.  Happy to see that his weight is down from last visit.  He tells me that his systolic blood pressures are usually in the 120-130s at home.  No claudication  ROS: A comprehensive was performed. Review of Systems  Constitutional: Negative for malaise/fatigue and weight loss.  HENT:  Negative for nosebleeds.   Respiratory: Negative for shortness of breath and wheezing.   Gastrointestinal: Negative for blood in stool, heartburn, melena and nausea.  Genitourinary: Negative for hematuria (microscopic on UA. ).  Musculoskeletal: Negative for back pain, falls, joint pain and myalgias (Occasional leg cramps at night).  Neurological: Negative for dizziness.  Psychiatric/Behavioral: Negative for depression and memory loss. The patient is not nervous/anxious and does not have insomnia.   All other systems reviewed and are negative.   The patient does not have symptoms concerning for COVID-19 infection (fever, chills, cough, or new shortness of breath).  The patient is practicing social distancing.   COVID-19 Education: The signs and symptoms of COVID-19 were discussed with the patient and how to seek care for testing (follow up with PCP or arrange E-visit).   The importance of social distancing was discussed today.   Past Medical History:  Diagnosis Date  . Arthritis   . CAD in native artery 1990   Status post PCI to the RCA --> CABG in 2002:  [name] Dyslipidemia, goal LDL below 70     on statin  . Hematuria    OCASIONAL HEMATURIA UNK CAUSE  . Hypertension   . Hypothyroidism   . OSA (obstructive sleep apnea)    AHI-18.3/hr, AHI REM-3.1/hr UNABLE TO ADJUST TO USING C-PAP  . PONV (postoperative nausea and vomiting)   . S/P CABG x 3 2002   LIMA-LAD,  SVG-PDA, SVG-OM.    Past Surgical History:  Procedure Laterality Date  . CARDIAC CATHETERIZATION  03/17/2010   Patent LIMA-LAD, small non-intervenable D1 with ostial 70% stenosis; patent SVG-OM with retrograde flow to the native circumflex & 90% stenosis in main Cx. 60-70% lesion in AVG Cx. Patent SVG-RPDA with retrograde flow filling the PL system.  [name] CARDIAC CATHETERIZATION  03/28/2000   Recommended CABG --> CABG in Jan 2002  . CARDIAC CATHETERIZATION  10/21/1988   Severe disease in Mid and Distal RCA. Mid portion-90%,  distal portion-80%  . CHOLECYSTECTOMY     1997  . CORONARY ANGIOPLASTY  12/02/1988   Proximal RCA 95% stenosis -- PTCA inflated with a 2.33mm balloon --> 3.0 mm Piccolino balloon resulting in a 20-30% irregularity; Distal RCA 90% stenosis --> 2.5 balloon results were normal  . CORONARY ANGIOPLASTY  08/04/1988   Distal RCA 95% occluded -> PTCA w/ 2.5 mm balloon -->.  30% irregularity  . CORONARY ARTERY BYPASS GRAFT  Jan 2002   3 VESSELS 2002; LIMA-LAD, SVG-OM, SVG to RPDA.  [name] JOINT REPLACEMENT    . NM MYOVIEW LTD  September 2011   mild ischemia in basal inerolateral, mid inferolateral, and apical regions, post-stress EF 61%, Low-Risk Scan;  . TOTAL KNEE ARTHROPLASTY  03/21/2011   Procedure: TOTAL KNEE ARTHROPLASTY;  Surgeon: Gearlean Alf;  Location: [facility];  Service: Orthopedics;  Laterality: Left;  . TOTAL KNEE ARTHROPLASTY  06/01/2012   Procedure: TOTAL KNEE ARTHROPLASTY;  Surgeon: Gearlean Alf, MD;  Location: [facility];  Service: Orthopedics;  Laterality: Right;    Current Meds  Medication Sig  . aspirin 81 MG tablet Take 81 mg by mouth daily.  [name] atorvastatin (LIPITOR) 20 MG tablet TAKE 1 TABLET EVERY DAY  AT  6PM  . Coenzyme Q10 (CO Q 10 PO) Take by mouth.  . enalapril (VASOTEC) 20 MG tablet Take 1 tablet (20 mg total) by mouth at bedtime.  [name] levothyroxine (SYNTHROID, LEVOTHROID) 125 MCG tablet Take 125 mcg by mouth. AM BEFORE BREAKFAST  . metoprolol succinate (TOPROL-XL) 25 MG 24 hr tablet Take 1 tablet (25 mg total) by mouth every morning. CALL OFFICE FOR APPOINTMENT  . Multiple Vitamin (MULTIVITAMIN) tablet Take 1 tablet by mouth daily.  . Omega-3 Fatty Acids (FISH OIL PO) Take by mouth.   Current Outpatient Medications on File Prior to Visit  Medication Sig Dispense Refill  . aspirin 81 MG tablet Take 81 mg by mouth daily.    [name] atorvastatin (LIPITOR) 20 MG tablet TAKE 1 TABLET EVERY DAY  AT  6PM 90 tablet 3  . Coenzyme Q10 (CO Q 10 PO) Take by mouth.    . enalapril (VASOTEC)  20 MG tablet Take 1 tablet (20 mg total) by mouth at bedtime. 90 tablet 3  . levothyroxine (SYNTHROID, LEVOTHROID) 125 MCG tablet Take 125 mcg by mouth. AM BEFORE BREAKFAST    . metoprolol succinate (TOPROL-XL) 25 MG 24 hr tablet Take 1 tablet (25 mg total) by mouth every morning. CALL OFFICE FOR APPOINTMENT 90 tablet 0  . Multiple Vitamin (MULTIVITAMIN) tablet Take 1 tablet by mouth daily.    . Omega-3 Fatty Acids (FISH OIL PO) Take by mouth.     No current facility-administered medications on file prior to visit.      Allergies  Allergen Reactions  . Contrast Media [Iodinated Diagnostic Agents] Other (See Comments)    Unknown   . Crestor [Rosuvastatin]     SEVERE LEG CRAMPS  . Sulfa Antibiotics  REACTION WAS YRS AGO - SWELLING, SKIN REDNESS  . Bactrim [Sulfamethoxazole-Trimethoprim] Rash  . Doxycycline Rash   Social History   Tobacco Use  . Smoking status: Former Smoker    Quit date: 03/14/1971    Years since quitting: 47.8  . Smokeless tobacco: Never Used  Substance Use Topics  . Alcohol use: No  . Drug use: No   Social History   Social History Narrative   Married father of one, grandfather of 2. Now working out at the gym 3 days a week. Does not smoke and does not drink.     family history includes Cancer in his sister; Heart disease in his father and mother.  Wt Readings from Last 3 Encounters:  01/07/19 166 lb (75.3 kg)  07/10/17 173 lb 3.2 oz (78.6 kg)  07/08/16 176 lb (79.8 kg)    PHYSICAL EXAM BP (!) 146/80 (BP Location: Left Arm, Patient Position: Sitting, Cuff Size: Normal)   Pulse (!) 59   Temp 98.2 F (36.8 C)   Ht 5\' 8"  (1.727 m)   Wt 166 lb (75.3 kg)   BMI 25.24 kg/m   Physical Exam  Constitutional: He is oriented to person, place, and time. He appears well-developed and well-nourished. No distress.  Healthy-appearing.  Well-groomed.  HENT:  Head: Normocephalic and atraumatic.  Neck: Normal range of motion. Neck supple. No hepatojugular  reflux and no JVD present. Carotid bruit is not present.  Cardiovascular: Normal rate, regular rhythm, intact distal pulses and normal pulses.  No extrasystoles are present. PMI is not displaced. Exam reveals no gallop and no friction rub.  Murmur heard.  Medium-pitched harsh crescendo-decrescendo early systolic murmur is present with a grade of 1/6 at the upper right sternal border radiating to the neck. Pulmonary/Chest: Effort normal and breath sounds normal. No respiratory distress. He has no wheezes.  Abdominal: Soft. Bowel sounds are normal. He exhibits no distension. There is no abdominal tenderness. There is no rebound.  Musculoskeletal: Normal range of motion.        General: No edema.  Neurological: He is alert and oriented to person, place, and time.  Psychiatric: He has a normal mood and affect. His behavior is normal. Judgment and thought content normal.  Nursing note and vitals reviewed.   Adult ECG Report  Rate: 59;  Rhythm: normal sinus rhythm and Left axis deviation/LAFB (-48) - with LVH. RBBB. Otherwise no significant change. Normal intervals and durations.;   Narrative Interpretation: Stable EKG   Other studies Reviewed: Additional studies/ records that were reviewed today include:  Recent Labs:   Derived from [facility] on December 13, 2018  TC 134, TG 82, HDL 46, LDL 60.  A1c 5.9.  Cr 1.02, K+ 4.6.  TSH 0.39.  Normal LFTs.  Glucose 108   ASSESSMENT / PLAN: Problem List Items Addressed This Visit    Palpitations; ? short runs of SVT/PAT (Chronic)    Pretty well controlled with Toprol and vagal maneuvers. I did tell him that he is having bad episodes, he can take an extra dose of Toprol.      Hyperlipidemia with target LDL less than 70 (Chronic)    His LDL level is just about 70 on 20 mg of atorvastatin plus omega-3 fatty acids.  We will simply just leave the dose as it is.  He will be due for labs to be checked prior to annual follow-up.      Relevant Orders   EKG  12-Lead (Completed)   Essential hypertension (Chronic)  His blood pressures pretty stable actually on an ACE inhibitor and Toprol.  I am leery of orthostatic symptoms, and he would like to avoid adding another medication.  For now we will continue to monitor.      Relevant Orders   EKG 12-Lead (Completed)   CAD in native artery -- status post CABG x3 (LIMA-LAD, SVG RPDA, SVG-OM) - Primary (Chronic)    Continues to do well.  Very active with no anginal symptoms. On aspirin, beta-blocker, ACE inhibitor and statin.  Also taking co-Q10 and omega-3 fatty acids.  No change.  He is reluctant to do any additional stress testing in the absence of active symptoms.      Relevant Orders   EKG 12-Lead (Completed)     Keep BP log.   Current medicines are reviewed at length with the patient today. (+/- concerns) n/a The following changes have been made: n/a  Patient Instructions  Medication Instructions:  NO CHANGES  If you need a refill on your cardiac medications before your next appointment, please call your pharmacy.   Lab work: NOT NEEDED   Testing/Procedures: Not needed  Follow-Up: At [facility], you and your health needs are our priority.  As part of our continuing mission to provide you with exceptional heart care, we have created designated Provider Care Teams.  These Care Teams include your primary Cardiologist (physician) and Advanced Practice Providers (APPs -  Physician Assistants and Nurse Practitioners) who all work together to provide you with the care you need, when you need it. . You will need a follow up appointment in 12 months- AUG 2020.  Please call our office 2 months in advance to schedule this appointment.  You may see [name], MD or one of the following Advanced Practice Providers on your designated Care Team:   . Rosaria Ferries, PA-C . Jory Sims, DNP, ANP  Any Other Special Instructions Will Be Listed Below (If Applicable).   Studies  Ordered:   Orders Placed This Encounter  Procedures  . EKG 12-Lead      [name], M.D., M.S. Interventional Cardiologist   Pager # [phone] Phone # [phone] [address] [place], [place] [address]

## 2019-01-09 ENCOUNTER — Encounter: Payer: Self-pay | Admitting: Cardiology

## 2019-01-09 NOTE — Assessment & Plan Note (Signed)
Pretty well controlled with Toprol and vagal maneuvers. I did tell him that he is having bad episodes, he can take an extra dose of Toprol.

## 2019-01-09 NOTE — Assessment & Plan Note (Signed)
His blood pressures pretty stable actually on an ACE inhibitor and Toprol.  I am leery of orthostatic symptoms, and he would like to avoid adding another medication.  For now we will continue to monitor.

## 2019-01-09 NOTE — Assessment & Plan Note (Signed)
Continues to do well.  Very active with no anginal symptoms. On aspirin, beta-blocker, ACE inhibitor and statin.  Also taking co-Q10 and omega-3 fatty acids.  No change.  He is reluctant to do any additional stress testing in the absence of active symptoms.

## 2019-01-09 NOTE — Assessment & Plan Note (Signed)
His LDL level is just about 70 on 20 mg of atorvastatin plus omega-3 fatty acids.  We will simply just leave the dose as it is.  He will be due for labs to be checked prior to annual follow-up.

## 2019-01-15 DIAGNOSIS — R7301 Impaired fasting glucose: Secondary | ICD-10-CM | POA: Diagnosis not present

## 2019-01-15 DIAGNOSIS — Z0000 Encounter for general adult medical examination without abnormal findings: Secondary | ICD-10-CM | POA: Diagnosis not present

## 2019-01-15 DIAGNOSIS — E782 Mixed hyperlipidemia: Secondary | ICD-10-CM | POA: Diagnosis not present

## 2019-01-15 DIAGNOSIS — G4733 Obstructive sleep apnea (adult) (pediatric): Secondary | ICD-10-CM | POA: Diagnosis not present

## 2019-01-15 DIAGNOSIS — I119 Hypertensive heart disease without heart failure: Secondary | ICD-10-CM | POA: Diagnosis not present

## 2019-01-15 DIAGNOSIS — I2510 Atherosclerotic heart disease of native coronary artery without angina pectoris: Secondary | ICD-10-CM | POA: Diagnosis not present

## 2019-01-15 DIAGNOSIS — E039 Hypothyroidism, unspecified: Secondary | ICD-10-CM | POA: Diagnosis not present

## 2019-01-15 DIAGNOSIS — R972 Elevated prostate specific antigen [PSA]: Secondary | ICD-10-CM | POA: Diagnosis not present

## 2019-01-15 DIAGNOSIS — N182 Chronic kidney disease, stage 2 (mild): Secondary | ICD-10-CM | POA: Diagnosis not present

## 2019-02-22 ENCOUNTER — Other Ambulatory Visit: Payer: Self-pay | Admitting: Cardiology

## 2019-04-29 ENCOUNTER — Other Ambulatory Visit: Payer: Self-pay | Admitting: Cardiology

## 2019-05-18 ENCOUNTER — Ambulatory Visit: Payer: Medicare HMO

## 2019-05-18 ENCOUNTER — Ambulatory Visit: Payer: Medicare Other | Attending: Internal Medicine

## 2019-05-18 DIAGNOSIS — Z23 Encounter for immunization: Secondary | ICD-10-CM | POA: Insufficient documentation

## 2019-05-18 NOTE — Progress Notes (Signed)
   Covid-19 Vaccination Clinic  Name:  [name] [name]    MRN: [id number] DOB: Dec 01, 1940  05/18/2019  Mr. Rudenko was observed post Covid-19 immunization for 15 minutes without incidence. He was provided with Vaccine Information Sheet and instruction to access the V-Safe system.   Mr. Corsino was instructed to call 911 with any severe reactions post vaccine: [name] Difficulty breathing  . Swelling of your face and throat  . A fast heartbeat  . A bad rash all over your body  . Dizziness and weakness    Immunizations Administered    Name Date Dose VIS Date Route   Pfizer COVID-19 Vaccine 05/18/2019 10:37 AM 0.3 mL 04/19/2019 Intramuscular   Manufacturer: [employer]   Lot: [id number]   [place]: [id number]

## 2019-06-06 ENCOUNTER — Ambulatory Visit: Payer: Medicare HMO

## 2019-06-08 ENCOUNTER — Ambulatory Visit: Payer: Medicare HMO | Attending: Internal Medicine

## 2019-06-08 DIAGNOSIS — Z23 Encounter for immunization: Secondary | ICD-10-CM | POA: Insufficient documentation

## 2019-06-08 NOTE — Progress Notes (Signed)
   Covid-19 Vaccination Clinic  Name:  [name] [name]    MRN: [id number] DOB: 11-15-1940  06/08/2019  [name] [name] was observed post Covid-19 immunization for 15 minutes without incidence. He was provided with Vaccine Information Sheet and instruction to access the V-Safe system.   [name] [name] was instructed to call 911 with any severe reactions post vaccine: [name] Difficulty breathing  . Swelling of your face and throat  . A fast heartbeat  . A bad rash all over your body  . Dizziness and weakness    Immunizations Administered    Name Date Dose VIS Date Route   Pfizer COVID-19 Vaccine 06/08/2019  8:05 AM 0.3 mL 04/19/2019 Intramuscular   Manufacturer: [place]   Lot: [id number]   [place]: [id number]

## 2019-07-01 ENCOUNTER — Other Ambulatory Visit: Payer: Self-pay | Admitting: Cardiology

## 2019-07-17 DIAGNOSIS — E039 Hypothyroidism, unspecified: Secondary | ICD-10-CM | POA: Diagnosis not present

## 2019-07-17 DIAGNOSIS — N182 Chronic kidney disease, stage 2 (mild): Secondary | ICD-10-CM | POA: Diagnosis not present

## 2019-07-17 DIAGNOSIS — I119 Hypertensive heart disease without heart failure: Secondary | ICD-10-CM | POA: Diagnosis not present

## 2019-07-17 DIAGNOSIS — E782 Mixed hyperlipidemia: Secondary | ICD-10-CM | POA: Diagnosis not present

## 2019-07-17 DIAGNOSIS — I2510 Atherosclerotic heart disease of native coronary artery without angina pectoris: Secondary | ICD-10-CM | POA: Diagnosis not present

## 2019-07-17 DIAGNOSIS — R7309 Other abnormal glucose: Secondary | ICD-10-CM | POA: Diagnosis not present

## 2019-07-31 DIAGNOSIS — R319 Hematuria, unspecified: Secondary | ICD-10-CM | POA: Diagnosis not present

## 2019-09-06 ENCOUNTER — Other Ambulatory Visit: Payer: Self-pay | Admitting: Cardiology

## 2019-09-19 ENCOUNTER — Other Ambulatory Visit: Payer: Self-pay | Admitting: Physician Assistant

## 2019-09-19 ENCOUNTER — Ambulatory Visit
Admission: RE | Admit: 2019-09-19 | Discharge: 2019-09-19 | Disposition: A | Discharge status: Home / Self Care | Payer: Medicare HMO | Source: Ambulatory Visit | Attending: Physician Assistant | Admitting: Physician Assistant

## 2019-09-19 DIAGNOSIS — R1032 Left lower quadrant pain: Secondary | ICD-10-CM

## 2019-09-25 ENCOUNTER — Other Ambulatory Visit: Payer: Self-pay

## 2019-09-25 MED ORDER — ATORVASTATIN CALCIUM 20 MG PO TABS: ORAL_TABLET | ORAL | 1 refills | Status: DC

## 2019-10-04 ENCOUNTER — Other Ambulatory Visit: Payer: Self-pay

## 2019-12-17 ENCOUNTER — Telehealth: Payer: Self-pay | Admitting: *Deleted

## 2019-12-17 NOTE — Telephone Encounter (Signed)
A message was left, re: his follow up visit. 

## 2020-01-14 ENCOUNTER — Ambulatory Visit: Payer: Medicare HMO | Admitting: Cardiology

## 2020-01-14 ENCOUNTER — Other Ambulatory Visit: Payer: Self-pay

## 2020-01-14 ENCOUNTER — Encounter: Payer: Self-pay | Admitting: Cardiology

## 2020-01-14 VITALS — BP 150/77 | HR 62 | Ht 68.0 in | Wt 168.2 lb

## 2020-01-14 DIAGNOSIS — I10 Essential (primary) hypertension: Secondary | ICD-10-CM | POA: Diagnosis not present

## 2020-01-14 DIAGNOSIS — I2510 Atherosclerotic heart disease of native coronary artery without angina pectoris: Secondary | ICD-10-CM

## 2020-01-14 DIAGNOSIS — R002 Palpitations: Secondary | ICD-10-CM

## 2020-01-14 DIAGNOSIS — G4739 Other sleep apnea: Secondary | ICD-10-CM

## 2020-01-14 DIAGNOSIS — E785 Hyperlipidemia, unspecified: Secondary | ICD-10-CM | POA: Diagnosis not present

## 2020-01-14 NOTE — Progress Notes (Signed)
Primary Care Provider: Mayra Neer, MD Cardiologist: [name], MD Electrophysiologist: None  Clinic Note: Chief Complaint  Patient presents with  . Follow-up    1 yr  . Coronary Artery Disease    no angina. (h/oCABG)  . Palpitations    HPI:    [name] [name]. is a 79 y.o. male with a longstanding cardiac history noted below who presents today for annual follow-up.   1990 => RCA PTCA -> 95% proximal and 90% distal RCA lesion treated with PTCA only.  2001 recurrent Angina --> CABG.  RE-look Cath in Nov2011: Patent LIMA-LAD, small non-intervenable D1 ostial 70%; patent SVG-OM w/ retrograde flow to the native LCx& 90% stenosis in main Cx. 60-70% lesion in AVG Cx. Patent SVG-RPDA with retrograde flow filling the PL system.   [name] [name]. was last seen on January 07, 2019 -> was doing fairly well note complaints.  Walking the dog about 3 to 4 days a week but unable to go to the gym.  Do lots of yard work and gardening.  No chest pain or pressure.  No exertional dyspnea.  A few off-and-on episodes of fast palpitations when out in the hot sun.  Easily broken with cough and/or cold water.  But no lightheadedness or dizziness.  No syncope or near syncope.  Trivial 1 today edema.  Was trying to lose weight, but not very successful.  Systolic blood pressure is usually stable in the 120 to 130 mmHg range.  Recent Hospitalizations: none  Reviewed  CV studies:    The following studies were reviewed today: (if available, images/films reviewed: From Epic Chart or Care Everywhere) . none:   Interval History:   [name] [name]. presents here today overall doing fairly well.  He is a little bit down because his his wife died on 03-Oct-2022 after long battle with chronic illness.  He is sad but is trying to the best to keep going.  He is just lonely. He is not walking as much as he did before partly because of the heat.  He is not order the gym because of Covid.  He still does  yard work.  He has noted about 3-4 episodes of rapid HR - break with Vagal maneuvers.   He still uses CPAP routinely and sleeps well.  Mild and occasional.  Actually is not taking a full 20 mg of Lipitor.  He is taking 1/2tablet indicating that taking 20 mg tablets caused him to have cramps.  CV Review of Symptoms (Summary): no chest pain or dyspnea on exertion positive for - edema, palpitations, rapid heart rate and Short of breath if he lies down without CPAP negative for - chest pain, dyspnea on exertion, irregular heartbeat, paroxysmal nocturnal dyspnea, shortness of breath or Lightheadedness or dizziness, syncope/near syncope, TIA/amaurosis fugax, claudication  The patient does not have symptoms concerning for COVID-19 infection (fever, chills, cough, or new shortness of breath).   REVIEWED OF SYSTEMS   Review of Systems  Constitutional: Negative for malaise/fatigue and weight loss.  HENT: Negative for congestion and nosebleeds.   Respiratory: Positive for cough (With allergies). Negative for shortness of breath and wheezing.   Gastrointestinal: Negative for blood in stool and melena.  Genitourinary: Negative for hematuria.       He has had microscopic hematuria  Musculoskeletal:       Occasional leg cramps mostly in the right  Neurological: Positive for dizziness (With quick changes in position) and tingling (Pedal peripheral  neuropathy).  Psychiatric/Behavioral: Positive for depression (More grieving from the loss of his wife.  He is lonely house by himself) and memory loss (Maybe a little bit, but relatively stable). The patient is not nervous/anxious and does not have insomnia.    I have reviewed and (if needed) personally updated the patient's problem list, medications, allergies, past medical and surgical history, social and family history.   PAST MEDICAL HISTORY   Past Medical History:  Diagnosis Date  . Arthritis   . CAD in native artery 1990   Status post PCI to the  RCA --> CABG in 2002:  [name] Dyslipidemia, goal LDL below 70     on statin  . Hematuria    OCASIONAL HEMATURIA UNK CAUSE  . Hypertension   . Hypothyroidism   . OSA (obstructive sleep apnea)    AHI-18.3/hr, AHI REM-3.1/hr UNABLE TO ADJUST TO USING C-PAP  . PONV (postoperative nausea and vomiting)   . S/P CABG x 3 2002   LIMA-LAD, SVG-PDA, SVG-OM.    PAST SURGICAL HISTORY   Past Surgical History:  Procedure Laterality Date  . CARDIAC CATHETERIZATION  03/17/2010   Patent LIMA-LAD, small non-intervenable D1 with ostial 70% stenosis; patent SVG-OM with retrograde flow to the native circumflex & 90% stenosis in main Cx. 60-70% lesion in AVG Cx. Patent SVG-RPDA with retrograde flow filling the PL system.  [name] CARDIAC CATHETERIZATION  03/28/2000   Recommended CABG --> CABG in Jan 2002  . CARDIAC CATHETERIZATION  10/21/1988   Severe disease in Mid and Distal RCA. Mid portion-90%, distal portion-80%  . CHOLECYSTECTOMY     1997  . CORONARY ANGIOPLASTY  12/02/1988   Proximal RCA 95% stenosis -- PTCA inflated with a 2.59mm balloon --> 3.0 mm Piccolino balloon resulting in a 20-30% irregularity; Distal RCA 90% stenosis --> 2.5 balloon results were normal  . CORONARY ANGIOPLASTY  08/04/1988   Distal RCA 95% occluded -> PTCA w/ 2.5 mm balloon -->.  30% irregularity  . CORONARY ARTERY BYPASS GRAFT  Jan 2002   3 VESSELS 2002; LIMA-LAD, SVG-OM, SVG to RPDA.  [name] JOINT REPLACEMENT    . NM MYOVIEW LTD  September 2011   mild ischemia in basal inerolateral, mid inferolateral, and apical regions, post-stress EF 61%, Low-Risk Scan;  . TOTAL KNEE ARTHROPLASTY  03/21/2011   Procedure: TOTAL KNEE ARTHROPLASTY;  Surgeon: Gearlean Alf;  Location: [facility];  Service: Orthopedics;  Laterality: Left;  . TOTAL KNEE ARTHROPLASTY  06/01/2012   Procedure: TOTAL KNEE ARTHROPLASTY;  Surgeon: Gearlean Alf, MD;  Location: [facility];  Service: Orthopedics;  Laterality: Right;    Immunization History  Administered Date(s)  Administered  . Influenza, Seasonal, Injecte, Preservative Fre 04/27/2012  . Influenza,inj,Quad PF,6+ Mos 03/04/2013  . PFIZER SARS-COV-2 Vaccination 05/18/2019, 06/08/2019  . Tdap 04/04/2014  --is waiting to hear about booster.    MEDICATIONS/ALLERGIES   Current Meds  Medication Sig  . aspirin 81 MG tablet Take 81 mg by mouth daily.  [name] atorvastatin (LIPITOR) 20 MG tablet TAKE 1 TABLET EVERY DAY  AT  6PM  . Coenzyme Q10 (CO Q 10 PO) Take by mouth.  . enalapril (VASOTEC) 20 MG tablet Take 1 tablet (20 mg total) by mouth at bedtime.  [name] levothyroxine (SYNTHROID, LEVOTHROID) 125 MCG tablet Take 125 mcg by mouth. AM BEFORE BREAKFAST  . metoprolol succinate (TOPROL-XL) 25 MG 24 hr tablet TAKE 1 TABLET EVERY DAY  . Multiple Vitamin (MULTIVITAMIN) tablet Take 1 tablet by mouth daily.  . Omega-3 Fatty  Acids (FISH OIL PO) Take by mouth.    Allergies  Allergen Reactions  . Contrast Media [Iodinated Diagnostic Agents] Other (See Comments)    Unknown   . Crestor [Rosuvastatin]     SEVERE LEG CRAMPS  . Sulfa Antibiotics     REACTION WAS YRS AGO - SWELLING, SKIN REDNESS  . Bactrim [Sulfamethoxazole-Trimethoprim] Rash  . 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  Less walking b/c dog getting older. Still no gym b/c concern re: COVID. Wife died 10-16-19--lots of stress with probate of will, etc.  OBJCTIVE -PE, EKG, labs   Wt Readings from Last 3 Encounters:  01/14/20 168 lb 3.2 oz (76.3 kg)  01/07/19 166 lb (75.3 kg)  07/10/17 173 lb 3.2 oz (78.6 kg)    Physical Exam: BP (!) 150/77   Pulse 62   Ht 5\' 8"  (1.727 m)   Wt 168 lb 3.2 oz (76.3 kg)   SpO2 97%   BMI 25.57 kg/m   BP was 170/71 mmHg, recheck 150/77;(this AM @ home 122/61mmHg) Physical Exam Constitutional:      General: He is not in acute distress.    Appearance: Normal appearance. He is normal weight. He is  not ill-appearing or toxic-appearing.  HENT:     Head: Normocephalic and atraumatic.  Neck:     Vascular: No carotid bruit.     Comments: JVD or HJR Cardiovascular:     Rate and Rhythm: Normal rate and regular rhythm.  Pulmonary:     Effort: Pulmonary effort is normal. No respiratory distress.     Breath sounds: Normal breath sounds.  Musculoskeletal:        General: Swelling (Trivial pedal edema) present. Normal range of motion.     Cervical back: Normal range of motion.  Neurological:     General: No focal deficit present.     Mental Status: He is alert and oriented to person, place, and time.  Psychiatric:        Mood and Affect: Mood normal.        Behavior: Behavior normal.        Thought Content: Thought content normal.        Judgment: Judgment normal.     Adult ECG Report  Rate: 62 ;  Rhythm: normal sinus rhythm and RBBB, LAFB/LVH--bifascicular block.  No significant change.;   Narrative Interpretation: Stable EKG  Recent Labs:   07/17/2019: TC 160, TG 91, HDL 46, LDL 97.  A1c 6.0.  TSH 1.9  09/19/2019: Hgb 13.3.  K+ 4.6. Cr 0.9.  No results found for: CHOL, HDL, LDLCALC, LDLDIRECT, TRIG, CHOLHDL Lab Results  Component Value Date   CREATININE 0.94 06/03/2012   BUN 19 06/03/2012   NA 132 (L) 06/03/2012   K 4.4 06/03/2012   CL 98 06/03/2012   CO2 24 06/03/2012   No results found for: TSH  ASSESSMENT/PLAN   Problem List Items Addressed This Visit    CAD in native artery -- status post CABG x3 (LIMA-LAD, SVG RPDA, SVG-OM) - Primary (Chronic)    Doing well with no active anginal symptoms.  He is very active.  Happy with how he is going.  He is on aspirin statin and beta-blocker along with ACE inhibitor.  Taking co-Q10 and fish oil.  As per previous discussion, the plan (per joint decision) will be to avoid additional stress testing unless he would have symptoms.  Relevant Orders   EKG 12-Lead (Completed)   Essential hypertension (Chronic)    Very  interesting how every time he comes in the clinic is blood pressures are high.  This morning his pressures look great and they have always been relatively well controlled.  Of asked that he continue to monitor at home is on enalapril 20 mg plus Toprol 25 mg.  Already to have persistent elevated blood pressures, probably consider low-dose HCTZ versus amlodipine.  However it seems that his pressures at home appear to be controlled.      Relevant Orders   EKG 12-Lead (Completed)   Palpitations; ? short runs of SVT/PAT (Chronic)    He is having intermittent spells, but only 3 or 4 that he actually had to consider using a vagal maneuvers to break.  He has not had a consider getting additional dose of beta-blocker.  These episodes are not lasting long enough to cause symptoms.      Relevant Orders   EKG 12-Lead (Completed)   Sleep apnea (Chronic)    Finally tolerating CPAP now.  Hopefully can continue to tolerate.      Hyperlipidemia with target LDL less than 70 (Chronic)    Interestingly, his LDL was 70 back in 2020 but now it is up to 97.  I think he has not been taking as much of the atorvastatin 10 mg as well as 20.  He said he cramped up a lot. Plan will be to substitute 10 mg Zetia for the 10 mg of atorvastatin that he is not taking.  His PCP follows labs and will be reasonable to check by the end of the year oral just waiting till March.         COVID-19 Education: The signs and symptoms of COVID-19 were discussed with the patient and how to seek care for testing (follow up with PCP or arrange E-visit).   The importance of social distancing and COVID-19 vaccination was discussed today. 1 min The patient is practicing social distancing & Masking.   I spent a total of 25 minutes with the patient spent in direct patient consultation.  Additional time spent with chart review  / charting (studies, outside notes, etc): 10 Total Time: 35 min   Current medicines are reviewed at length  with the patient today.  (+/- concerns) none  Notice: This dictation was prepared with Dragon dictation along with smaller phrase technology. Any transcriptional errors that result from this process are unintentional and may not be corrected upon review.  Patient Instructions / Medication Changes & Studies & Tests Ordered   Patient Instructions  Medication Instructions:  No changes *If you need a refill on your cardiac medications before your next appointment, please call your pharmacy*   Lab Work: Not needed   Testing/Procedures: Not needed   Follow-Up: At [facility], you and your health needs are our priority.  As part of our continuing mission to provide you with exceptional heart care, we have created designated Provider Care Teams.  These Care Teams include your primary Cardiologist (physician) and Advanced Practice Providers (APPs -  Physician Assistants and Nurse Practitioners) who all work together to provide you with the care you need, when you need it.  We recommend signing up for the patient portal called "MyChart".  Sign up information is provided on this After Visit Summary.  MyChart is used to connect with patients for Virtual Visits (Telemedicine).  Patients are able to view lab/test results, encounter notes, upcoming appointments, etc.  Non-urgent messages can be sent to your provider as well.   To learn more about what you can do with MyChart, go to [email or web address].    Your next appointment:   12 month(s)  The format for your next appointment:   In Person  Provider:   [name], MD      Studies Ordered:   Orders Placed This Encounter  Procedures  . EKG 12-Lead     [name], M.D., M.S. Interventional Cardiologist   Pager # [phone] Phone # [phone] [address] [place], [place] [address]   Thank you for choosing Heartcare at [facility]!!

## 2020-01-14 NOTE — Patient Instructions (Signed)

## 2020-01-21 DIAGNOSIS — N400 Enlarged prostate without lower urinary tract symptoms: Secondary | ICD-10-CM | POA: Diagnosis not present

## 2020-01-21 DIAGNOSIS — R7301 Impaired fasting glucose: Secondary | ICD-10-CM | POA: Diagnosis not present

## 2020-01-21 DIAGNOSIS — E039 Hypothyroidism, unspecified: Secondary | ICD-10-CM | POA: Diagnosis not present

## 2020-01-21 DIAGNOSIS — Z0000 Encounter for general adult medical examination without abnormal findings: Secondary | ICD-10-CM | POA: Diagnosis not present

## 2020-01-21 DIAGNOSIS — N182 Chronic kidney disease, stage 2 (mild): Secondary | ICD-10-CM | POA: Diagnosis not present

## 2020-01-21 DIAGNOSIS — I119 Hypertensive heart disease without heart failure: Secondary | ICD-10-CM | POA: Diagnosis not present

## 2020-01-21 DIAGNOSIS — G4733 Obstructive sleep apnea (adult) (pediatric): Secondary | ICD-10-CM | POA: Diagnosis not present

## 2020-01-21 DIAGNOSIS — I2510 Atherosclerotic heart disease of native coronary artery without angina pectoris: Secondary | ICD-10-CM | POA: Diagnosis not present

## 2020-01-21 DIAGNOSIS — E782 Mixed hyperlipidemia: Secondary | ICD-10-CM | POA: Diagnosis not present

## 2020-01-21 DIAGNOSIS — M1990 Unspecified osteoarthritis, unspecified site: Secondary | ICD-10-CM | POA: Diagnosis not present

## 2020-01-22 ENCOUNTER — Encounter: Payer: Self-pay | Admitting: Cardiology

## 2020-01-22 ENCOUNTER — Other Ambulatory Visit: Payer: Self-pay | Admitting: Cardiovascular Disease

## 2020-01-22 NOTE — Assessment & Plan Note (Signed)
He is having intermittent spells, but only 3 or 4 that he actually had to consider using a vagal maneuvers to break.  He has not had a consider getting additional dose of beta-blocker.  These episodes are not lasting long enough to cause symptoms.

## 2020-01-22 NOTE — Assessment & Plan Note (Signed)
Doing well with no active anginal symptoms.  He is very active.  Happy with how he is going.  He is on aspirin statin and beta-blocker along with ACE inhibitor.  Taking co-Q10 and fish oil.  As per previous discussion, the plan (per joint decision) will be to avoid additional stress testing unless he would have symptoms.

## 2020-01-22 NOTE — Assessment & Plan Note (Signed)
Finally tolerating CPAP now.  Hopefully can continue to tolerate.

## 2020-01-22 NOTE — Assessment & Plan Note (Signed)
Very interesting how every time he comes in the clinic is blood pressures are high.  This morning his pressures look great and they have always been relatively well controlled.  Of asked that he continue to monitor at home is on enalapril 20 mg plus Toprol 25 mg.  Already to have persistent elevated blood pressures, probably consider low-dose HCTZ versus amlodipine.  However it seems that his pressures at home appear to be controlled.

## 2020-01-22 NOTE — Assessment & Plan Note (Signed)
Interestingly, his LDL was 70 back in 2020 but now it is up to 97.  I think he has not been taking as much of the atorvastatin 10 mg as well as 20.  He said he cramped up a lot. Plan will be to substitute 10 mg Zetia for the 10 mg of atorvastatin that he is not taking.  His PCP follows labs and will be reasonable to check by the end of the year oral just waiting till March.

## 2020-02-20 ENCOUNTER — Ambulatory Visit: Payer: Medicare HMO | Attending: Internal Medicine

## 2020-02-20 DIAGNOSIS — Z23 Encounter for immunization: Secondary | ICD-10-CM

## 2020-02-20 NOTE — Progress Notes (Signed)
   [id number]  Name:  Amauri Keefe.    MRN: [id number] DOB: Aug 30, 1940  02/20/2020  Mr. Kazmierczak was observed post Covid-19 immunization for 15 minutes without incident. He was provided with Vaccine Information Sheet and instruction to access the V-Safe system.   Mr. Burciaga was instructed to call 911 with any severe reactions post vaccine: [name] Difficulty breathing  . Swelling of face and throat  . A fast heartbeat  . A bad rash all over body  . Dizziness and weakness

## 2020-02-25 DIAGNOSIS — H524 Presbyopia: Secondary | ICD-10-CM | POA: Diagnosis not present

## 2020-02-25 DIAGNOSIS — H52209 Unspecified astigmatism, unspecified eye: Secondary | ICD-10-CM | POA: Diagnosis not present

## 2020-02-25 DIAGNOSIS — H5203 Hypermetropia, bilateral: Secondary | ICD-10-CM | POA: Diagnosis not present

## 2020-02-26 ENCOUNTER — Other Ambulatory Visit: Payer: Self-pay | Admitting: Cardiology

## 2020-05-19 DIAGNOSIS — G629 Polyneuropathy, unspecified: Secondary | ICD-10-CM | POA: Diagnosis not present

## 2020-05-19 DIAGNOSIS — R208 Other disturbances of skin sensation: Secondary | ICD-10-CM | POA: Diagnosis not present

## 2020-05-19 DIAGNOSIS — R319 Hematuria, unspecified: Secondary | ICD-10-CM | POA: Diagnosis not present

## 2020-05-27 DIAGNOSIS — G5600 Carpal tunnel syndrome, unspecified upper limb: Secondary | ICD-10-CM | POA: Diagnosis not present

## 2020-05-27 DIAGNOSIS — G629 Polyneuropathy, unspecified: Secondary | ICD-10-CM | POA: Diagnosis not present

## 2020-06-01 DIAGNOSIS — Z85828 Personal history of other malignant neoplasm of skin: Secondary | ICD-10-CM | POA: Diagnosis not present

## 2020-06-01 DIAGNOSIS — C44722 Squamous cell carcinoma of skin of right lower limb, including hip: Secondary | ICD-10-CM | POA: Diagnosis not present

## 2020-06-01 DIAGNOSIS — L821 Other seborrheic keratosis: Secondary | ICD-10-CM | POA: Diagnosis not present

## 2020-06-01 DIAGNOSIS — L570 Actinic keratosis: Secondary | ICD-10-CM | POA: Diagnosis not present

## 2020-06-01 DIAGNOSIS — C4442 Squamous cell carcinoma of skin of scalp and neck: Secondary | ICD-10-CM | POA: Diagnosis not present

## 2020-06-01 DIAGNOSIS — L814 Other melanin hyperpigmentation: Secondary | ICD-10-CM | POA: Diagnosis not present

## 2020-06-04 DIAGNOSIS — R310 Gross hematuria: Secondary | ICD-10-CM | POA: Diagnosis not present

## 2020-06-16 DIAGNOSIS — N200 Calculus of kidney: Secondary | ICD-10-CM | POA: Diagnosis not present

## 2020-06-16 DIAGNOSIS — N3289 Other specified disorders of bladder: Secondary | ICD-10-CM | POA: Diagnosis not present

## 2020-06-16 DIAGNOSIS — N320 Bladder-neck obstruction: Secondary | ICD-10-CM | POA: Diagnosis not present

## 2020-06-16 DIAGNOSIS — R310 Gross hematuria: Secondary | ICD-10-CM | POA: Diagnosis not present

## 2020-06-22 DIAGNOSIS — N200 Calculus of kidney: Secondary | ICD-10-CM | POA: Diagnosis not present

## 2020-06-22 DIAGNOSIS — R310 Gross hematuria: Secondary | ICD-10-CM | POA: Diagnosis not present

## 2020-07-01 DIAGNOSIS — N201 Calculus of ureter: Secondary | ICD-10-CM | POA: Diagnosis not present

## 2020-07-06 DIAGNOSIS — N200 Calculus of kidney: Secondary | ICD-10-CM | POA: Diagnosis not present

## 2020-07-21 DIAGNOSIS — R7301 Impaired fasting glucose: Secondary | ICD-10-CM | POA: Diagnosis not present

## 2020-07-21 DIAGNOSIS — I119 Hypertensive heart disease without heart failure: Secondary | ICD-10-CM | POA: Diagnosis not present

## 2020-07-21 DIAGNOSIS — N182 Chronic kidney disease, stage 2 (mild): Secondary | ICD-10-CM | POA: Diagnosis not present

## 2020-07-21 DIAGNOSIS — E782 Mixed hyperlipidemia: Secondary | ICD-10-CM | POA: Diagnosis not present

## 2020-07-21 DIAGNOSIS — E039 Hypothyroidism, unspecified: Secondary | ICD-10-CM | POA: Diagnosis not present

## 2020-07-21 DIAGNOSIS — I2510 Atherosclerotic heart disease of native coronary artery without angina pectoris: Secondary | ICD-10-CM | POA: Diagnosis not present

## 2020-07-21 DIAGNOSIS — G5600 Carpal tunnel syndrome, unspecified upper limb: Secondary | ICD-10-CM | POA: Diagnosis not present

## 2020-07-22 ENCOUNTER — Other Ambulatory Visit: Payer: Self-pay | Admitting: Cardiology

## 2020-08-12 ENCOUNTER — Telehealth: Payer: Self-pay | Admitting: Cardiology

## 2020-08-12 DIAGNOSIS — N200 Calculus of kidney: Secondary | ICD-10-CM | POA: Diagnosis not present

## 2020-08-12 NOTE — Telephone Encounter (Signed)
Pt c/o medication issue:  1. Name of Medication: Atorvastatin   2. How are you currently taking this medication (dosage and times per day)?   3. Are you having a reaction (difficulty breathing--STAT)? No   4. What is your medication issue?   No issues reported, but the patient would like to discuss going on a different medication for his cholesterol. He states in the past Dr. Ellyn Hack has recommended another medication for his cholesterol and he is now interested.

## 2020-08-13 NOTE — Telephone Encounter (Signed)
Sounds like a good plan.  Lets take a look.  [name], MD

## 2020-08-13 NOTE — Telephone Encounter (Signed)
Pt state he was recently seen by pcp and told his cholesterol remain elevated. Pt report Dr. Ellyn Hack previously discussed starting zetia and he would like to try medication. Nurse recommended pt have pcp forward lab work for Dr. Ellyn Hack to review.

## 2020-08-13 NOTE — Telephone Encounter (Signed)
Pt made aware and verbalized understanding.

## 2020-08-13 NOTE — Telephone Encounter (Signed)
Patient following up on call from yesterday. Patient wanted to possibly try Zetia

## 2020-08-19 DIAGNOSIS — G5603 Carpal tunnel syndrome, bilateral upper limbs: Secondary | ICD-10-CM | POA: Diagnosis not present

## 2020-09-08 DIAGNOSIS — G5602 Carpal tunnel syndrome, left upper limb: Secondary | ICD-10-CM | POA: Diagnosis not present

## 2020-09-22 DIAGNOSIS — M25632 Stiffness of left wrist, not elsewhere classified: Secondary | ICD-10-CM | POA: Diagnosis not present

## 2020-11-21 IMAGING — US VENOUS DOPPLER ULTRASOUND OF LEFT LOWER EXTREMITY
1 series · 13 of 24 positions shown · non-contrast
Comparison: None.

CLINICAL DATA: Left lower extremity edema.  Evaluate for DVT.



[Series 1: venous doppler ultrasound of left lower extremity · 0.08mm/px · 13 of 34 slices shown]
[im 1/34]
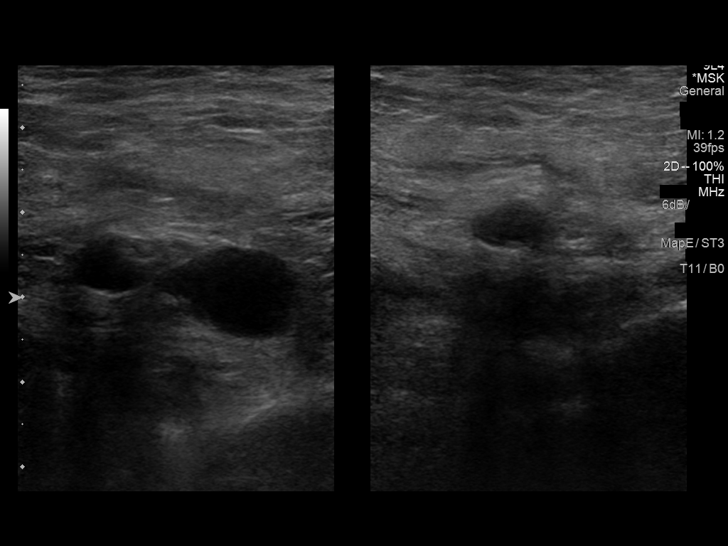
[im 3/34]
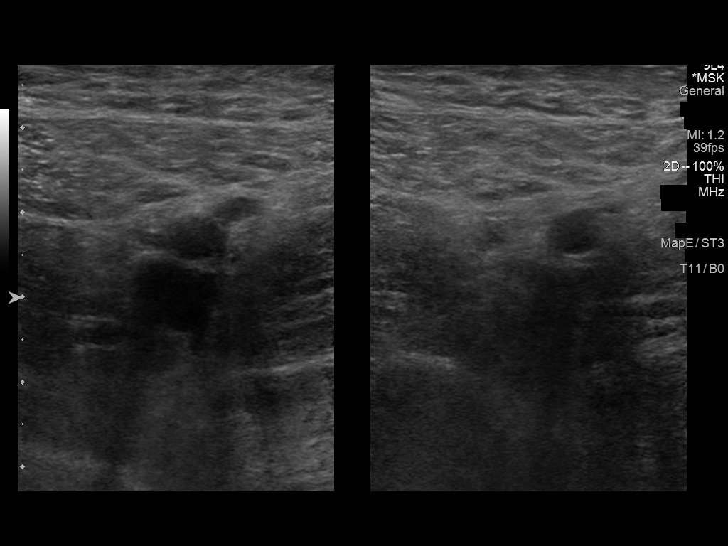
[im 6/34]
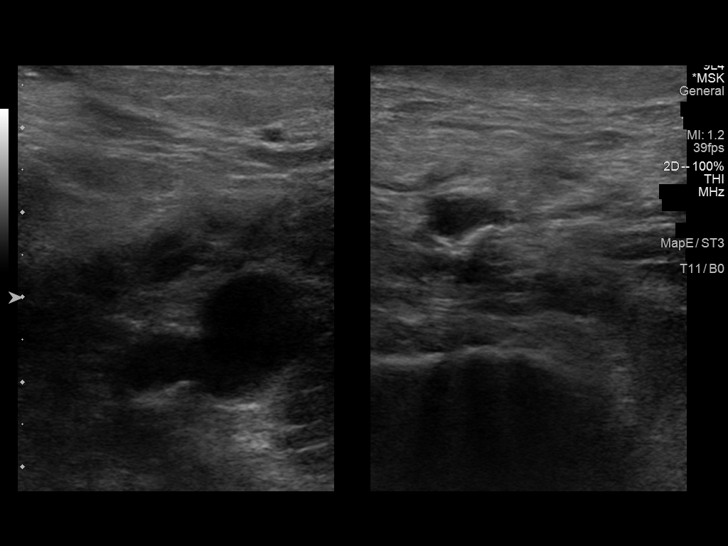
[im 9/34]
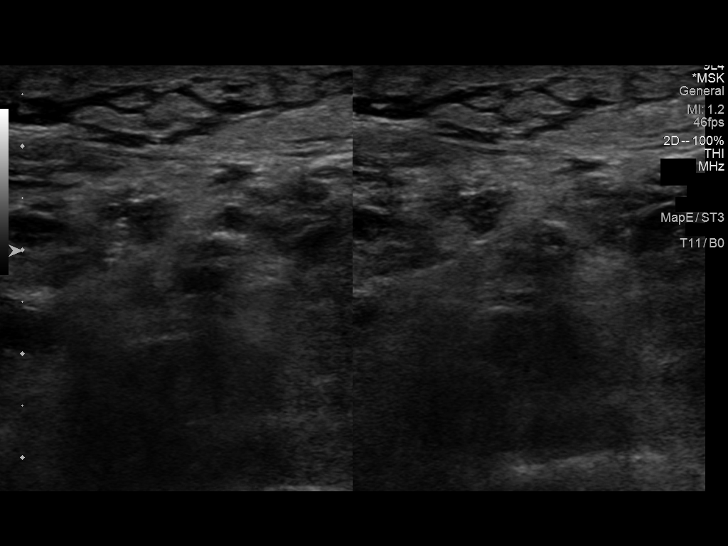
[im 12/34]
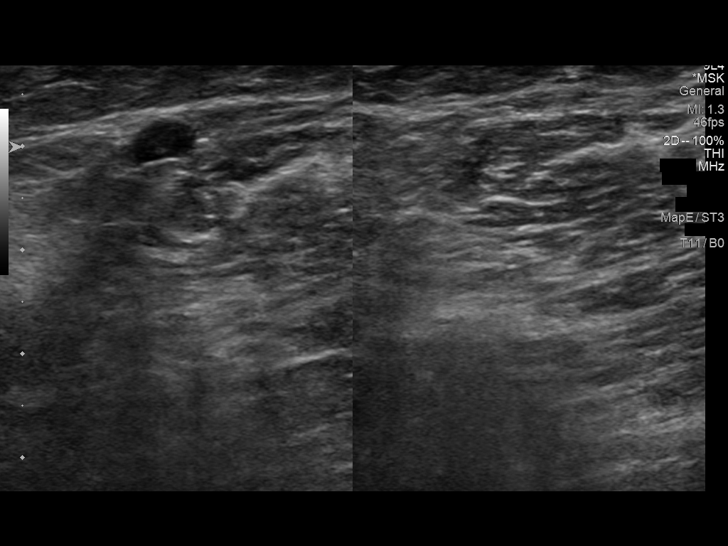
[im 15/34]
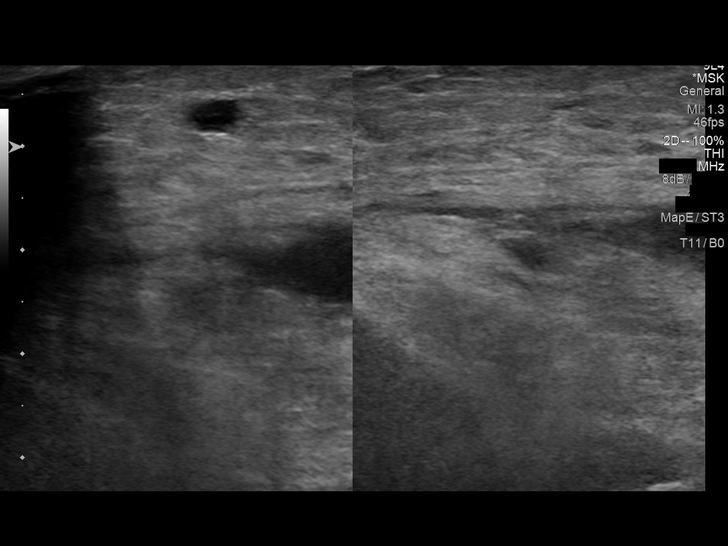
[im 18/34]
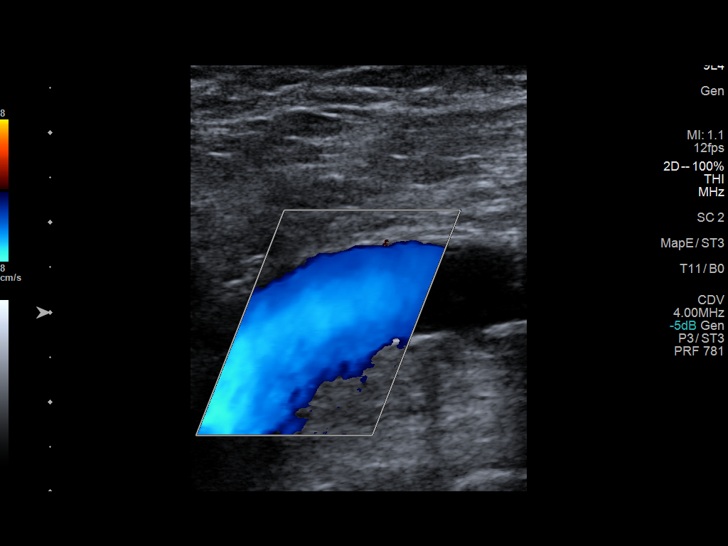
[im 19/34]
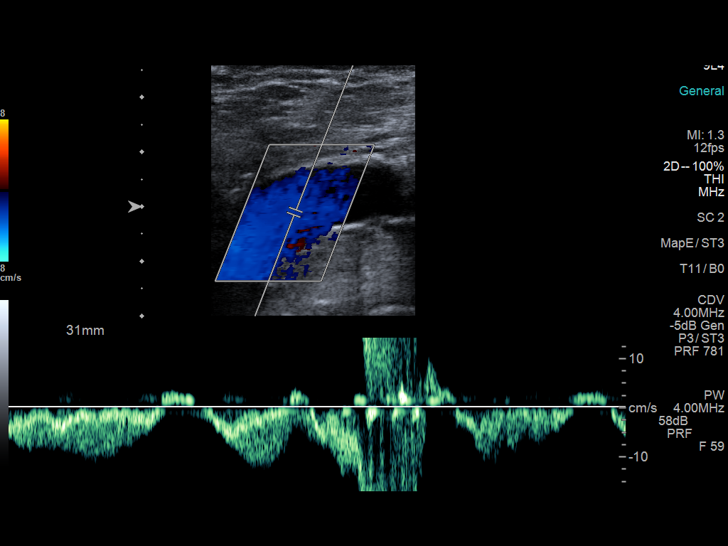
[im 22/34]
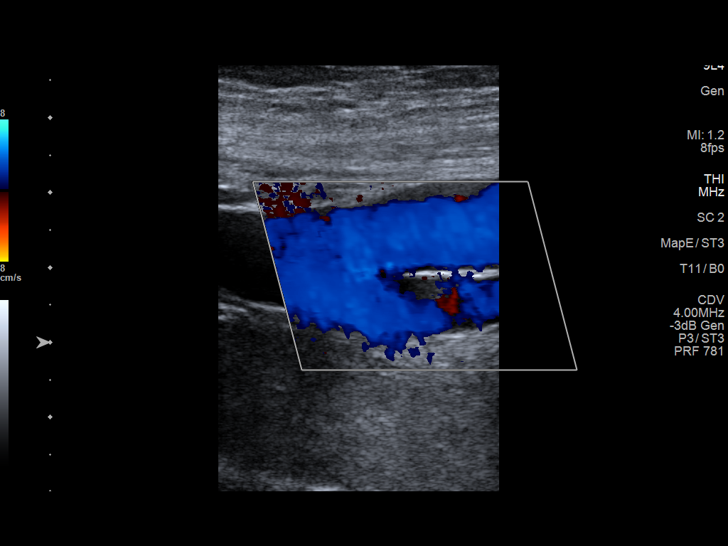
[im 25/34]
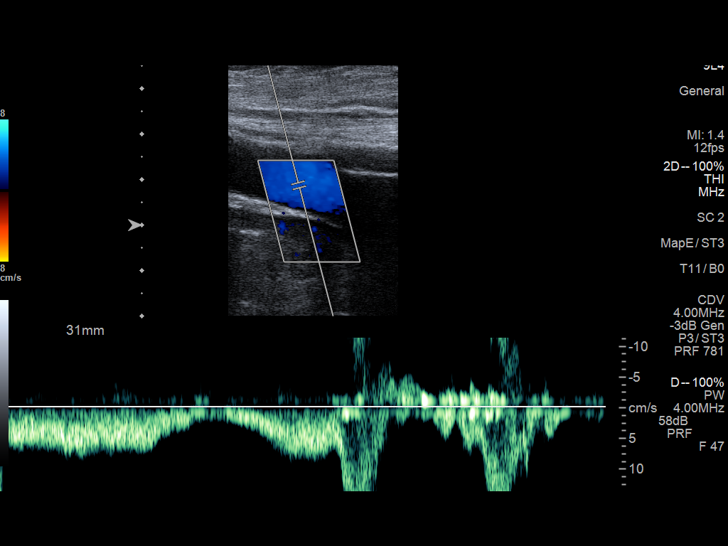
[im 28/34]
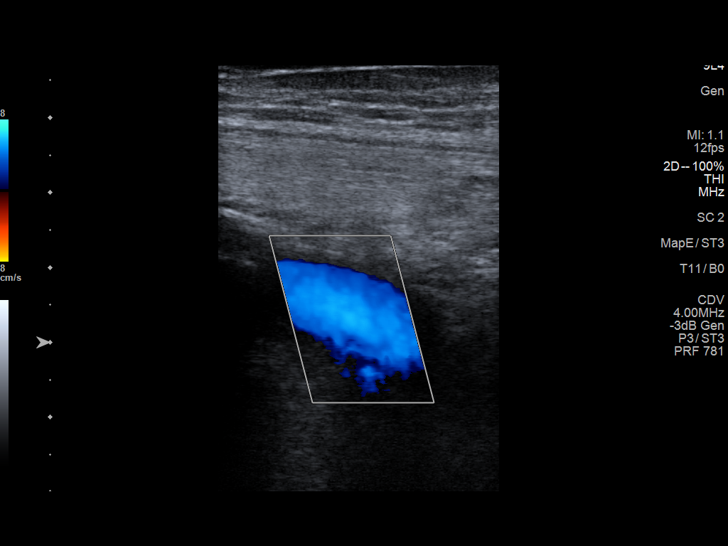
[im 31/34]
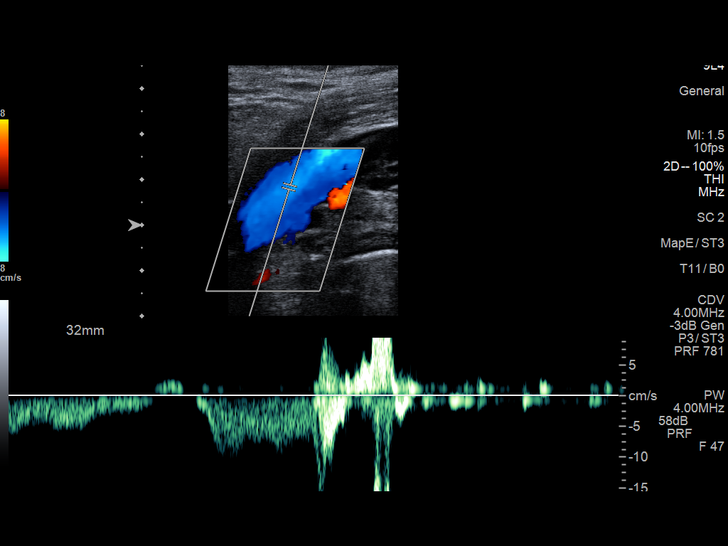
[im 34/34]
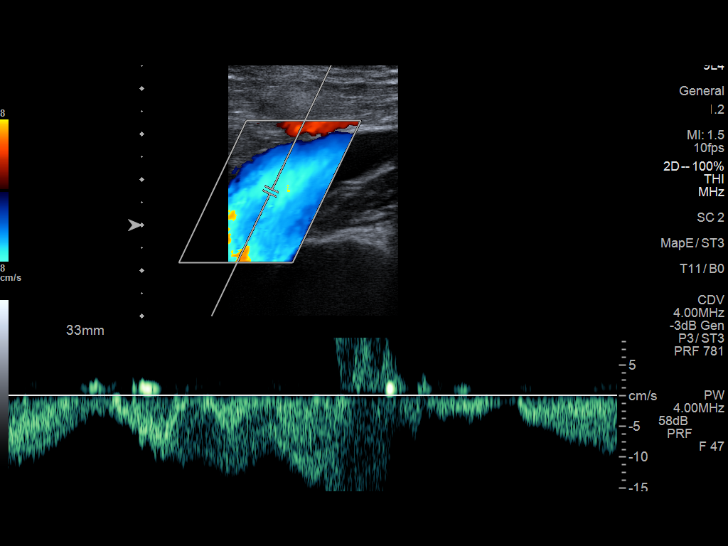

[13 of 24 positions shown; findings below may reference images not displayed]

FINDINGS: Contralateral Common Femoral Vein: Respiratory phasicity is normal
and symmetric with the symptomatic side. No evidence of thrombus.
Normal compressibility.

Common Femoral Vein: No evidence of thrombus. Normal
compressibility, respiratory phasicity and response to augmentation.

Saphenofemoral Junction: No evidence of thrombus. Normal
compressibility and flow on color Doppler imaging.

Profunda Femoral Vein: No evidence of thrombus. Normal
compressibility and flow on color Doppler imaging.

Femoral Vein: No evidence of thrombus. Normal compressibility,
respiratory phasicity and response to augmentation.

Popliteal Vein: No evidence of thrombus. Normal compressibility,
respiratory phasicity and response to augmentation.

Calf Veins: No evidence of thrombus. Normal compressibility and flow
on color Doppler imaging.

Superficial Great Saphenous Vein: No evidence of thrombus. Normal
compressibility.

Venous Reflux:  None.

Other Findings:  None.
IMPRESSION: No evidence of DVT within the left lower extremity.

## 2020-11-30 DIAGNOSIS — D0461 Carcinoma in situ of skin of right upper limb, including shoulder: Secondary | ICD-10-CM | POA: Diagnosis not present

## 2020-11-30 DIAGNOSIS — Z85828 Personal history of other malignant neoplasm of skin: Secondary | ICD-10-CM | POA: Diagnosis not present

## 2020-11-30 DIAGNOSIS — D0462 Carcinoma in situ of skin of left upper limb, including shoulder: Secondary | ICD-10-CM | POA: Diagnosis not present

## 2020-11-30 DIAGNOSIS — L814 Other melanin hyperpigmentation: Secondary | ICD-10-CM | POA: Diagnosis not present

## 2020-11-30 DIAGNOSIS — L821 Other seborrheic keratosis: Secondary | ICD-10-CM | POA: Diagnosis not present

## 2020-11-30 DIAGNOSIS — L570 Actinic keratosis: Secondary | ICD-10-CM | POA: Diagnosis not present

## 2020-12-17 ENCOUNTER — Other Ambulatory Visit: Payer: Self-pay | Admitting: Cardiovascular Disease

## 2021-01-13 ENCOUNTER — Other Ambulatory Visit: Payer: Self-pay | Admitting: Cardiology

## 2021-02-01 DIAGNOSIS — N182 Chronic kidney disease, stage 2 (mild): Secondary | ICD-10-CM | POA: Diagnosis not present

## 2021-02-01 DIAGNOSIS — G4733 Obstructive sleep apnea (adult) (pediatric): Secondary | ICD-10-CM | POA: Diagnosis not present

## 2021-02-01 DIAGNOSIS — Z0000 Encounter for general adult medical examination without abnormal findings: Secondary | ICD-10-CM | POA: Diagnosis not present

## 2021-02-01 DIAGNOSIS — I2510 Atherosclerotic heart disease of native coronary artery without angina pectoris: Secondary | ICD-10-CM | POA: Diagnosis not present

## 2021-02-01 DIAGNOSIS — R7301 Impaired fasting glucose: Secondary | ICD-10-CM | POA: Diagnosis not present

## 2021-02-01 DIAGNOSIS — E782 Mixed hyperlipidemia: Secondary | ICD-10-CM | POA: Diagnosis not present

## 2021-02-01 DIAGNOSIS — E039 Hypothyroidism, unspecified: Secondary | ICD-10-CM | POA: Diagnosis not present

## 2021-02-01 DIAGNOSIS — Z23 Encounter for immunization: Secondary | ICD-10-CM | POA: Diagnosis not present

## 2021-02-01 DIAGNOSIS — I119 Hypertensive heart disease without heart failure: Secondary | ICD-10-CM | POA: Diagnosis not present

## 2021-02-04 DIAGNOSIS — G5603 Carpal tunnel syndrome, bilateral upper limbs: Secondary | ICD-10-CM | POA: Diagnosis not present

## 2021-02-08 DIAGNOSIS — N200 Calculus of kidney: Secondary | ICD-10-CM | POA: Diagnosis not present

## 2021-03-09 ENCOUNTER — Ambulatory Visit: Payer: Medicare HMO | Attending: Internal Medicine

## 2021-03-09 DIAGNOSIS — Z23 Encounter for immunization: Secondary | ICD-10-CM

## 2021-03-09 NOTE — Progress Notes (Signed)
   [id number]  Name:  [name] [name].    MRN: [id number] DOB: 1941/04/13  03/09/2021  Mr. Tuch was observed post Covid-19 immunization for 15 minutes without incident. He was provided with Vaccine Information Sheet and instruction to access the V-Safe system.   Mr. Granada was instructed to call 911 with any severe reactions post vaccine: Difficulty breathing  Swelling of face and throat  A fast heartbeat  A bad rash all over body  Dizziness and weakness   Immunizations Administered     Name Date Dose VIS Date Route   Pfizer Covid-19 Vaccine Bivalent Booster 03/09/2021 10:42 AM 0.3 mL 01/06/2021 Intramuscular   Manufacturer: [place]   Lot: [id number]   [place]: [phone]

## 2021-03-17 DIAGNOSIS — Z4789 Encounter for other orthopedic aftercare: Secondary | ICD-10-CM | POA: Diagnosis not present

## 2021-03-17 DIAGNOSIS — G5601 Carpal tunnel syndrome, right upper limb: Secondary | ICD-10-CM | POA: Diagnosis not present

## 2021-03-23 DIAGNOSIS — H52223 Regular astigmatism, bilateral: Secondary | ICD-10-CM | POA: Diagnosis not present

## 2021-03-23 DIAGNOSIS — H5203 Hypermetropia, bilateral: Secondary | ICD-10-CM | POA: Diagnosis not present

## 2021-03-23 DIAGNOSIS — H43811 Vitreous degeneration, right eye: Secondary | ICD-10-CM | POA: Diagnosis not present

## 2021-03-23 DIAGNOSIS — H524 Presbyopia: Secondary | ICD-10-CM | POA: Diagnosis not present

## 2021-03-28 NOTE — Progress Notes (Signed)
Labs from PCP: 02/01/2021- Na+ 134, K+ 5.0, Cl- 101, HCO3-28, BUN 20, Cr 0.98, Glu 93, Ca2+ 9.9; AST 35, ALT 31, AlkP 39  TC 133, TG 77, HDL 53, LDL 65 --> Labs back to goal.  Looks good.  Continue current management. The combination of Zetia plus atorvastatin is doing well.  [name], MD

## 2021-03-29 ENCOUNTER — Other Ambulatory Visit (HOSPITAL_BASED_OUTPATIENT_CLINIC_OR_DEPARTMENT_OTHER): Payer: Self-pay

## 2021-03-29 MED ORDER — PFIZER COVID-19 VAC BIVALENT 30 MCG/0.3ML IM SUSP
INTRAMUSCULAR | 0 refills | Status: DC
  Filled 2021-03-29: qty 0.3, 1d supply, fill #0

## 2021-03-30 ENCOUNTER — Ambulatory Visit: Payer: Medicare HMO | Admitting: Cardiology

## 2021-03-30 ENCOUNTER — Encounter: Payer: Self-pay | Admitting: Cardiology

## 2021-03-30 ENCOUNTER — Other Ambulatory Visit: Payer: Self-pay

## 2021-03-30 VITALS — BP 138/70 | HR 54 | Ht 68.0 in | Wt 161.0 lb

## 2021-03-30 DIAGNOSIS — I10 Essential (primary) hypertension: Secondary | ICD-10-CM

## 2021-03-30 DIAGNOSIS — E785 Hyperlipidemia, unspecified: Secondary | ICD-10-CM | POA: Diagnosis not present

## 2021-03-30 DIAGNOSIS — I2510 Atherosclerotic heart disease of native coronary artery without angina pectoris: Secondary | ICD-10-CM

## 2021-03-30 DIAGNOSIS — G4739 Other sleep apnea: Secondary | ICD-10-CM

## 2021-03-30 DIAGNOSIS — R002 Palpitations: Secondary | ICD-10-CM | POA: Diagnosis not present

## 2021-03-30 NOTE — Patient Instructions (Addendum)
Medication Instructions:   NO CHANGES CURRENTLY   IF YOUR ARE DIZZY OR WOOZY DURING THE DAY YOU CAN HOLD ENALAPRIL AT BEDTIME  *If you need a refill on your cardiac medications before your next appointment, please call your pharmacy*   Lab Work:  NOT NEEDED If you have labs (blood work) drawn today and your tests are completely normal, you will receive your results only by: [place] (if you have MyChart) OR A paper copy in the mail If you have any lab test that is abnormal or we need to change your treatment, we will call you to review the results.   Testing/Procedures: NOT NEEDED   Follow-Up: At [facility], you and your health needs are our priority.  As part of our continuing mission to provide you with exceptional heart care, we have created designated Provider Care Teams.  These Care Teams include your primary Cardiologist (physician) and Advanced Practice Providers (APPs -  Physician Assistants and Nurse Practitioners) who all work together to provide you with the care you need, when you need it.  We recommend signing up for the patient portal called "MyChart".  Sign up information is provided on this After Visit Summary.  MyChart is used to connect with patients for Virtual Visits (Telemedicine).  Patients are able to view lab/test results, encounter notes, upcoming appointments, etc.  Non-urgent messages can be sent to your provider as well.   To learn more about what you can do with MyChart, go to [email or web address].    Your next appointment:   12 month(s)  The format for your next appointment:   In Person  Provider:   [name], MD

## 2021-03-30 NOTE — Progress Notes (Signed)
Primary Care Provider: Mayra Neer, MD Cardiologist: [name], MD Electrophysiologist: None  Clinic Note: Chief Complaint  Patient presents with   Follow-up    Doing well.  No angina.  Rare palpitations   Coronary Artery Disease    No angina   Tachycardia    still has short spells of tachycardia none more than 10 minutes.  Break with vagal maneuvers.   ===================================  ASSESSMENT/PLAN   Problem List Items Addressed This Visit       Cardiology Problems   CAD in native artery -- status post CABG x3 (LIMA-LAD, SVG RPDA, SVG-OM) (Chronic)    Doing well.  Relatively active for now.  No angina or dyspnea.  Based on previous discussion, we decided to avoid additional stress test for surveillance unless he has symptoms.  Plan: Continue current dose of aspirin, statin, beta-blocker and ACE inhibitor.  -> Okay to hold aspirin 5-7 days preop for surgeries or procedures.      Relevant Medications   atorvastatin (LIPITOR) 20 MG tablet   Other Relevant Orders   EKG 12-Lead (Completed)   Essential hypertension (Chronic)    Borderline blood pressure today, but with him having some dizziness (orthostatic) I would now will be more aggressive with.  He is on stable dose of enalapril and Toprol. With intermittent orthostatic dizziness, recommended holding enalapril if he is feeling lightheaded, dizzy or woozy.  Especially in the summertime months. Would probably allow for mild permissive hypertension.        Relevant Medications   atorvastatin (LIPITOR) 20 MG tablet   Hyperlipidemia with target LDL less than 70 - Primary (Chronic)    Labs show LDL of 65.  Well-controlled.  Currently taking 10 mg of atorvastatin.  Although new guidelines would indicate target less than 38, with advanced age I would not be more aggressive.      Relevant Medications   atorvastatin (LIPITOR) 20 MG tablet     Other   Palpitations; ? short runs of SVT/PAT (Chronic)     Still has short bursts of SVT.  He is not actually sure if there are any particular triggers.  Reiterated use of vagal maneuvers, avoiding triggers and staying adequately hydrated. No room to titrate beta-blocker any further.  If episodes tend to last longer or he becomes more symptomatic, not able to break with vagal maneuvers, could consider antiarrhythmic agent, but with advanced age appropriate to simply monitor       Relevant Orders   EKG 12-Lead (Completed)   Sleep apnea (Chronic)    Tolerating CPAP.      ===================================  HPI:    [name] [name]. is a 80 y.o. male with a longstanding history of CAD-CABG, HTN, HLD and PSVT who presents today for annual follow-up.  1990 => RCA PTCA -> 95% proximal and 90% distal RCA lesion treated with PTCA only. 2001 recurrent Angina --> CABGx3 RE-look Cath in Nov2011: Patent LIMA-LAD, small non-intervenable D1 ostial 70%; patent SVG-OM w/ retrograde flow to the native LCx& 90% stenosis in main Cx. 60-70% lesion in AVG Cx. Patent SVG-RPDA with retrograde flow filling the PL system.  [name] was last seen on January 14, 2020.  Doing very well.  He was down because his wife had passed away in 10-02-2019.  She had a long battle with chronic illness.  He was just trying to best keep going.  Feeling somewhat lonely.  Not walking as much because of the heat, but also avoiding gyms  because of COVID.  Doing yard work.  He has had about 3-4 episodes of rapid heart rates broken with vagal maneuvers.  Still uses CPAP routinely.  Was taking 1/2 tablet of Lipitor.  20 mg aches and cramps.  Recent Hospitalizations: None  Reviewed  CV studies:    The following studies were reviewed today: (if available, images/films reviewed: From Epic Chart or Care Everywhere) None:   Interval History:   [name] returns today for follow-up doing pretty well.  He denies any chest pain or pressure.  He is pretty active but not as  active as he had been.  He still tries to take the dogs out for walks, but is not being out of the gym because he is not yet ready to trust the safety of the gyms.  No heart failure symptoms of PND, orthopnea edema.  Every ~2-3 months - Tachy spell - last ~5-10 min; lies down & does Vagal Meneuvers& Ice water - no syncope / near syncope.  Balance is off - tends to wobble.  Still a little dizzy in summer -- trying to drink more  CV Review of Symptoms (Summary) Cardiovascular ROS: no chest pain or dyspnea on exertion positive for - palpitations and rapid heart rate negative for - edema, irregular heartbeat, orthopnea, paroxysmal nocturnal dyspnea, shortness of breath, or syncope/near syncope, TIA/amaurosis fugax, claudication.  REVIEWED OF SYSTEMS   Review of Systems  Constitutional:  Negative for malaise/fatigue and weight loss.  HENT:  Negative for congestion and nosebleeds.   Respiratory:  Positive for cough (Allergy related cough). Negative for shortness of breath.   Gastrointestinal:  Negative for blood in stool and melena.  Genitourinary:  Positive for hematuria (Microscopic hematuria -- cleared with Lithotrypsy in Feb for large kidney stone).  Musculoskeletal:  Negative for falls, joint pain and myalgias.       Cramps in leg - often worse when seated long time. ; L wrist pain from Carpal Tunnel Sgx  Neurological:  Positive for dizziness (Dizziness with quick change in position, peripheral neuropathy.  Also worsened several months-when dehydrated.). Negative for headaches.       Dizziness with quick change in position, peripheral neuropathy.  Endo/Heme/Allergies:  Positive for environmental allergies.  Psychiatric/Behavioral:  Negative for depression and memory loss (Mild memory loss). The patient is not nervous/anxious.    I have reviewed and (if needed) personally updated the patient's problem list, medications, allergies, past medical and surgical history, social and family history.    PAST MEDICAL HISTORY   Past Medical History:  Diagnosis Date   Arthritis    CAD in native artery 1990   Status post PCI to the RCA --> CABG in 2002:   Dyslipidemia, goal LDL below 70     on statin   Hematuria    OCASIONAL HEMATURIA UNK CAUSE   Hypertension    Hypothyroidism    OSA (obstructive sleep apnea)    AHI-18.3/hr, AHI REM-3.1/hr UNABLE TO ADJUST TO USING C-PAP   PONV (postoperative nausea and vomiting)    S/P CABG x 3 2002   LIMA-LAD, SVG-PDA, SVG-OM.    PAST SURGICAL HISTORY   Past Surgical History:  Procedure Laterality Date   CARDIAC CATHETERIZATION  03/17/2010   Patent LIMA-LAD, small non-intervenable D1 with ostial 70% stenosis; patent SVG-OM with retrograde flow to the native circumflex & 90% stenosis in main Cx. 60-70% lesion in AVG Cx. Patent SVG-RPDA with retrograde flow filling the PL system.   CARDIAC CATHETERIZATION  03/28/2000  Recommended CABG --> CABG in Jan 2002   CARDIAC CATHETERIZATION  10/21/1988   Severe disease in Mid and Distal RCA. Mid portion-90%, distal portion-80%   CHOLECYSTECTOMY     1997   CORONARY ANGIOPLASTY  12/02/1988   Proximal RCA 95% stenosis -- PTCA inflated with a 2.39mm balloon --> 3.0 mm Piccolino balloon resulting in a 20-30% irregularity; Distal RCA 90% stenosis --> 2.5 balloon results were normal   CORONARY ANGIOPLASTY  08/04/1988   Distal RCA 95% occluded -> PTCA w/ 2.5 mm balloon -->.  30% irregularity   CORONARY ARTERY BYPASS GRAFT  Jan 2002   3 VESSELS 2002; LIMA-LAD, SVG-OM, SVG to RPDA.   JOINT REPLACEMENT     NM MYOVIEW LTD  September 2011   mild ischemia in basal inerolateral, mid inferolateral, and apical regions, post-stress EF 61%, Low-Risk Scan;   TOTAL KNEE ARTHROPLASTY  03/21/2011   Procedure: TOTAL KNEE ARTHROPLASTY;  Surgeon: Gearlean Alf;  Location: [facility];  Service: Orthopedics;  Laterality: Left;   TOTAL KNEE ARTHROPLASTY  06/01/2012   Procedure: TOTAL KNEE ARTHROPLASTY;  Surgeon: Gearlean Alf, MD;   Location: [facility];  Service: Orthopedics;  Laterality: Right;    Immunization History  Administered Date(s) Administered   Influenza, Seasonal, Injecte, Preservative Fre 04/27/2012   Influenza,inj,Quad PF,6+ Mos 03/04/2013   PFIZER(Purple Top)SARS-COV-2 Vaccination 05/18/2019, 06/08/2019, 02/20/2020   Pfizer Covid-19 Vaccine Bivalent Booster 40yrs & up 03/09/2021   Tdap 04/04/2014    MEDICATIONS/ALLERGIES   Current Meds  Medication Sig   aspirin 81 MG tablet Take 81 mg by mouth daily.   atorvastatin (LIPITOR) 20 MG tablet Take 10 mg by mouth daily.   Coenzyme Q10 (CO Q 10 PO) Take by mouth.   COVID-19 mRNA bivalent vaccine, Pfizer, (PFIZER COVID-19 VAC BIVALENT) injection Inject into the muscle.   enalapril (VASOTEC) 20 MG tablet Take 1 tablet (20 mg total) by mouth at bedtime.   levothyroxine (SYNTHROID, LEVOTHROID) 125 MCG tablet Take 125 mcg by mouth. AM BEFORE BREAKFAST   metoprolol succinate (TOPROL-XL) 25 MG 24 hr tablet TAKE 1 TABLET EVERY DAY (NEED MD APPOINTMENT)   Multiple Vitamin (MULTIVITAMIN) tablet Take 1 tablet by mouth daily.   Omega-3 Fatty Acids (FISH OIL PO) Take by mouth.   tamsulosin (FLOMAX) 0.4 MG CAPS capsule Take 0.4 mg by mouth daily.    Allergies  Allergen Reactions   Contrast Media [Iodinated Diagnostic Agents] Other (See Comments)    Unknown    Crestor [Rosuvastatin]     SEVERE LEG CRAMPS   Sulfa Antibiotics     REACTION WAS YRS AGO - SWELLING, SKIN REDNESS   Bactrim [Sulfamethoxazole-Trimethoprim] Rash   Doxycycline Rash    SOCIAL HISTORY/FAMILY HISTORY   Reviewed in Epic:  Pertinent findings:  Social History   Tobacco Use   Smoking status: Former    Types: Cigarettes    Quit date: 03/14/1971    Years since quitting: 50.1   Smokeless tobacco: Never  Substance Use Topics   Alcohol use: No   Drug use: No   Social History   Social History Narrative   Widowed father of one, grandfather of 2. Now working out at the gym 3 days a week.  Does not smoke and does not drink.      His wife died on 22-Oct-2019 after long handle on his arms.    OBJCTIVE -PE, EKG, labs   Wt Readings from Last 3 Encounters:  03/30/21 161 lb (73 kg)  01/14/20 168 lb  3.2 oz (76.3 kg)  01/07/19 166 lb (75.3 kg)    Physical Exam: BP 138/70 (BP Location: Right Arm)   Pulse (!) 54   Ht 5\' 8"  (1.727 m)   Wt 161 lb (73 kg)   SpO2 98%   BMI 24.48 kg/m  Physical Exam Vitals reviewed.  Constitutional:      General: He is not in acute distress.    Appearance: Normal appearance. He is normal weight. He is not ill-appearing or toxic-appearing.  HENT:     Head: Normocephalic and atraumatic.  Neck:     Vascular: No carotid bruit or JVD.  Cardiovascular:     Rate and Rhythm: Normal rate and regular rhythm. No extrasystoles are present.    Chest Wall: PMI is not displaced.     Pulses: Normal pulses.     Heart sounds: Normal heart sounds, S1 normal and S2 normal. No murmur heard.   No friction rub. No gallop.  Pulmonary:     Effort: Pulmonary effort is normal. No respiratory distress.     Breath sounds: Normal breath sounds.  Chest:     Chest wall: No tenderness.  Musculoskeletal:        General: Swelling (Trivial) present. Normal range of motion.     Cervical back: Normal range of motion and neck supple.  Skin:    General: Skin is warm and dry.  Neurological:     General: No focal deficit present.     Mental Status: He is alert and oriented to person, place, and time.  Psychiatric:        Mood and Affect: Mood normal.        Behavior: Behavior normal.        Thought Content: Thought content normal.        Judgment: Judgment normal.     Adult ECG Report  Rate: 54;  Rhythm: normal sinus rhythm and left axis deviation, carotid artery.  LVH.  Septal MI, age-indeterminate. ;   Narrative Interpretation: Stable  Recent Labs:   02/01/2021: TC 130, TG 77, HDL 53, LDL 65.  A1c 6.0. Cr 2.8, K+ 5.0.  TSH 0 22. No results found for: CHOL, HDL,  LDLCALC, LDLDIRECT, TRIG, CHOLHDL Lab Results  Component Value Date   CREATININE 0.94 06/03/2012   BUN 19 06/03/2012   NA 132 (L) 06/03/2012   K 4.4 06/03/2012   CL 98 06/03/2012   CO2 24 06/03/2012   CBC Latest Ref Rng & Units 06/03/2012 06/02/2012 05/24/2012  WBC 4.0 - 10.5 K/uL 13.9(H) 9.0 6.1  Hemoglobin 13.0 - 17.0 g/dL 9.9(L) 9.9(L) 14.0  Hematocrit 39.0 - 52.0 % 27.9(L) 28.5(L) 40.4  Platelets 150 - 400 K/uL 192 165 238    No results found for: HGBA1C No results found for: TSH  ==================================================  COVID-19 Education: The signs and symptoms of COVID-19 were discussed with the patient and how to seek care for testing (follow up with PCP or arrange E-visit).    I spent a total of 26 minutes with the patient spent in direct patient consultation.  Additional time spent with chart review  / charting (studies, outside notes, etc): 15 min Total Time: 41 min  Current medicines are reviewed at length with the patient today.  (+/- concerns) none he is not actually sure if there are any particular triggers.  This visit occurred during the SARS-CoV-2 public health emergency.  Safety protocols were in place, including screening questions prior to the visit, additional usage of staff PPE, and  extensive cleaning of exam room while observing appropriate contact time as indicated for disinfecting solutions.  Notice: This dictation was prepared with Dragon dictation along with smart phrase technology. Any transcriptional errors that result from this process are unintentional and may not be corrected upon review.  Studies Ordered:   Orders Placed This Encounter  Procedures   EKG 12-Lead    Patient Instructions / Medication Changes & Studies & Tests Ordered   Patient Instructions  Medication Instructions:   NO CHANGES CURRENTLY   IF YOUR ARE DIZZY OR WOOZY DURING THE DAY YOU CAN HOLD ENALAPRIL AT BEDTIME  *If you need a refill on your cardiac  medications before your next appointment, please call your pharmacy*   Lab Work:  NOT NEEDED If you have labs (blood work) drawn today and your tests are completely normal, you will receive your results only by: MyChart Message (if you have MyChart) OR A paper copy in the mail If you have any lab test that is abnormal or we need to change your treatment, we will call you to review the results.   Testing/Procedures: NOT NEEDED   Follow-Up: At [facility], you and your health needs are our priority.  As part of our continuing mission to provide you with exceptional heart care, we have created designated Provider Care Teams.  These Care Teams include your primary Cardiologist (physician) and Advanced Practice Providers (APPs -  Physician Assistants and Nurse Practitioners) who all work together to provide you with the care you need, when you need it.  We recommend signing up for the patient portal called "MyChart".  Sign up information is provided on this After Visit Summary.  MyChart is used to connect with patients for Virtual Visits (Telemedicine).  Patients are able to view lab/test results, encounter notes, upcoming appointments, etc.  Non-urgent messages can be sent to your provider as well.   To learn more about what you can do with MyChart, go to [email or web address].    Your next appointment:   12 month(s)  The format for your next appointment:   In Person  Provider:   [name], MD       [name], M.D., M.S. Interventional Cardiologist   Pager # [phone] Phone # [phone] [address]. [place], [place] [address]   Thank you for choosing Heartcare at [facility]!!

## 2021-03-31 DIAGNOSIS — M25641 Stiffness of right hand, not elsewhere classified: Secondary | ICD-10-CM | POA: Diagnosis not present

## 2021-04-15 DIAGNOSIS — G5602 Carpal tunnel syndrome, left upper limb: Secondary | ICD-10-CM | POA: Diagnosis not present

## 2021-04-15 DIAGNOSIS — Z4789 Encounter for other orthopedic aftercare: Secondary | ICD-10-CM | POA: Diagnosis not present

## 2021-04-24 ENCOUNTER — Encounter: Payer: Self-pay | Admitting: Cardiology

## 2021-04-24 NOTE — Assessment & Plan Note (Signed)
Labs show LDL of 65.  Well-controlled.  Currently taking 10 mg of atorvastatin.  Although new guidelines would indicate target less than 16, with advanced age I would not be more aggressive.

## 2021-04-24 NOTE — Assessment & Plan Note (Signed)
Still has short bursts of SVT.  He is not actually sure if there are any particular triggers.  Reiterated use of vagal maneuvers, avoiding triggers and staying adequately hydrated. No room to titrate beta-blocker any further.  If episodes tend to last longer or he becomes more symptomatic, not able to break with vagal maneuvers, could consider antiarrhythmic agent, but with advanced age appropriate to simply monitor

## 2021-04-24 NOTE — Assessment & Plan Note (Signed)
Borderline blood pressure today, but with him having some dizziness (orthostatic) I would now will be more aggressive with.  He is on stable dose of enalapril and Toprol. With intermittent orthostatic dizziness, recommended holding enalapril if he is feeling lightheaded, dizzy or woozy.  Especially in the summertime months. Would probably allow for mild permissive hypertension.

## 2021-04-24 NOTE — Assessment & Plan Note (Signed)
Tolerating CPAP 

## 2021-04-24 NOTE — Assessment & Plan Note (Signed)
Doing well.  Relatively active for now.  No angina or dyspnea.  Based on previous discussion, we decided to avoid additional stress test for surveillance unless he has symptoms.  Plan: Continue current dose of aspirin, statin, beta-blocker and ACE inhibitor.  -> Okay to hold aspirin 5-7 days preop for surgeries or procedures.

## 2021-04-29 DIAGNOSIS — H524 Presbyopia: Secondary | ICD-10-CM | POA: Diagnosis not present

## 2021-04-29 DIAGNOSIS — H52209 Unspecified astigmatism, unspecified eye: Secondary | ICD-10-CM | POA: Diagnosis not present

## 2021-04-29 DIAGNOSIS — H5203 Hypermetropia, bilateral: Secondary | ICD-10-CM | POA: Diagnosis not present

## 2021-05-26 DIAGNOSIS — G4733 Obstructive sleep apnea (adult) (pediatric): Secondary | ICD-10-CM | POA: Diagnosis not present

## 2021-05-26 DIAGNOSIS — I119 Hypertensive heart disease without heart failure: Secondary | ICD-10-CM | POA: Diagnosis not present

## 2021-06-01 DIAGNOSIS — G5603 Carpal tunnel syndrome, bilateral upper limbs: Secondary | ICD-10-CM | POA: Diagnosis not present

## 2021-06-08 DIAGNOSIS — L570 Actinic keratosis: Secondary | ICD-10-CM | POA: Diagnosis not present

## 2021-06-08 DIAGNOSIS — Z85828 Personal history of other malignant neoplasm of skin: Secondary | ICD-10-CM | POA: Diagnosis not present

## 2021-06-08 DIAGNOSIS — L821 Other seborrheic keratosis: Secondary | ICD-10-CM | POA: Diagnosis not present

## 2021-06-08 DIAGNOSIS — L814 Other melanin hyperpigmentation: Secondary | ICD-10-CM | POA: Diagnosis not present

## 2021-06-08 DIAGNOSIS — R233 Spontaneous ecchymoses: Secondary | ICD-10-CM | POA: Diagnosis not present

## 2021-06-08 DIAGNOSIS — D485 Neoplasm of uncertain behavior of skin: Secondary | ICD-10-CM | POA: Diagnosis not present

## 2021-08-02 DIAGNOSIS — G629 Polyneuropathy, unspecified: Secondary | ICD-10-CM | POA: Diagnosis not present

## 2021-08-02 DIAGNOSIS — G4733 Obstructive sleep apnea (adult) (pediatric): Secondary | ICD-10-CM | POA: Diagnosis not present

## 2021-08-02 DIAGNOSIS — R270 Ataxia, unspecified: Secondary | ICD-10-CM | POA: Diagnosis not present

## 2021-08-02 DIAGNOSIS — I119 Hypertensive heart disease without heart failure: Secondary | ICD-10-CM | POA: Diagnosis not present

## 2021-08-02 DIAGNOSIS — R7301 Impaired fasting glucose: Secondary | ICD-10-CM | POA: Diagnosis not present

## 2021-08-02 DIAGNOSIS — I2510 Atherosclerotic heart disease of native coronary artery without angina pectoris: Secondary | ICD-10-CM | POA: Diagnosis not present

## 2021-08-02 DIAGNOSIS — E039 Hypothyroidism, unspecified: Secondary | ICD-10-CM | POA: Diagnosis not present

## 2021-08-02 DIAGNOSIS — E782 Mixed hyperlipidemia: Secondary | ICD-10-CM | POA: Diagnosis not present

## 2021-08-02 DIAGNOSIS — N182 Chronic kidney disease, stage 2 (mild): Secondary | ICD-10-CM | POA: Diagnosis not present

## 2021-08-03 ENCOUNTER — Other Ambulatory Visit: Payer: Self-pay | Admitting: Family Medicine

## 2021-08-03 DIAGNOSIS — R270 Ataxia, unspecified: Secondary | ICD-10-CM

## 2021-08-14 ENCOUNTER — Ambulatory Visit
Admission: RE | Admit: 2021-08-14 | Discharge: 2021-08-14 | Disposition: A | Discharge status: Home / Self Care | Payer: Medicare HMO | Source: Ambulatory Visit | Attending: Family Medicine | Admitting: Family Medicine

## 2021-08-14 DIAGNOSIS — R269 Unspecified abnormalities of gait and mobility: Secondary | ICD-10-CM | POA: Diagnosis not present

## 2021-08-14 DIAGNOSIS — M4316 Spondylolisthesis, lumbar region: Secondary | ICD-10-CM | POA: Diagnosis not present

## 2021-08-14 DIAGNOSIS — M48061 Spinal stenosis, lumbar region without neurogenic claudication: Secondary | ICD-10-CM | POA: Diagnosis not present

## 2021-08-14 DIAGNOSIS — M545 Low back pain, unspecified: Secondary | ICD-10-CM | POA: Diagnosis not present

## 2021-08-14 DIAGNOSIS — R270 Ataxia, unspecified: Secondary | ICD-10-CM

## 2021-08-14 DIAGNOSIS — R200 Anesthesia of skin: Secondary | ICD-10-CM | POA: Diagnosis not present

## 2021-08-14 DIAGNOSIS — M2578 Osteophyte, vertebrae: Secondary | ICD-10-CM | POA: Diagnosis not present

## 2021-09-02 ENCOUNTER — Other Ambulatory Visit: Payer: Self-pay | Admitting: Cardiology

## 2021-09-13 DIAGNOSIS — M5451 Vertebrogenic low back pain: Secondary | ICD-10-CM | POA: Diagnosis not present

## 2021-09-30 DIAGNOSIS — M5416 Radiculopathy, lumbar region: Secondary | ICD-10-CM | POA: Diagnosis not present

## 2021-10-01 DIAGNOSIS — M5416 Radiculopathy, lumbar region: Secondary | ICD-10-CM | POA: Diagnosis not present

## 2021-10-01 DIAGNOSIS — M5451 Vertebrogenic low back pain: Secondary | ICD-10-CM | POA: Diagnosis not present

## 2021-10-18 DIAGNOSIS — S61411A Laceration without foreign body of right hand, initial encounter: Secondary | ICD-10-CM | POA: Diagnosis not present

## 2021-10-18 DIAGNOSIS — T798XXA Other early complications of trauma, initial encounter: Secondary | ICD-10-CM | POA: Diagnosis not present

## 2021-10-19 DIAGNOSIS — M5451 Vertebrogenic low back pain: Secondary | ICD-10-CM | POA: Diagnosis not present

## 2021-10-27 DIAGNOSIS — M25532 Pain in left wrist: Secondary | ICD-10-CM | POA: Diagnosis not present

## 2021-10-27 DIAGNOSIS — M542 Cervicalgia: Secondary | ICD-10-CM | POA: Diagnosis not present

## 2021-10-27 DIAGNOSIS — Z4789 Encounter for other orthopedic aftercare: Secondary | ICD-10-CM | POA: Diagnosis not present

## 2021-10-27 DIAGNOSIS — M79641 Pain in right hand: Secondary | ICD-10-CM | POA: Diagnosis not present

## 2021-10-27 DIAGNOSIS — G5602 Carpal tunnel syndrome, left upper limb: Secondary | ICD-10-CM | POA: Diagnosis not present

## 2021-10-27 DIAGNOSIS — G5601 Carpal tunnel syndrome, right upper limb: Secondary | ICD-10-CM | POA: Diagnosis not present

## 2021-10-27 DIAGNOSIS — M79642 Pain in left hand: Secondary | ICD-10-CM | POA: Diagnosis not present

## 2021-10-27 DIAGNOSIS — G5603 Carpal tunnel syndrome, bilateral upper limbs: Secondary | ICD-10-CM | POA: Diagnosis not present

## 2021-11-06 DIAGNOSIS — M25532 Pain in left wrist: Secondary | ICD-10-CM | POA: Diagnosis not present

## 2021-11-06 DIAGNOSIS — M542 Cervicalgia: Secondary | ICD-10-CM | POA: Diagnosis not present

## 2021-11-10 DIAGNOSIS — G609 Hereditary and idiopathic neuropathy, unspecified: Secondary | ICD-10-CM | POA: Diagnosis not present

## 2021-11-10 DIAGNOSIS — M5417 Radiculopathy, lumbosacral region: Secondary | ICD-10-CM | POA: Diagnosis not present

## 2021-11-15 DIAGNOSIS — G5603 Carpal tunnel syndrome, bilateral upper limbs: Secondary | ICD-10-CM | POA: Diagnosis not present

## 2021-11-15 DIAGNOSIS — M79641 Pain in right hand: Secondary | ICD-10-CM | POA: Diagnosis not present

## 2021-11-16 DIAGNOSIS — G959 Disease of spinal cord, unspecified: Secondary | ICD-10-CM | POA: Diagnosis not present

## 2021-11-29 ENCOUNTER — Other Ambulatory Visit: Payer: Self-pay | Admitting: Neurosurgery

## 2021-11-29 ENCOUNTER — Telehealth: Payer: Self-pay | Admitting: Cardiology

## 2021-11-29 ENCOUNTER — Telehealth: Payer: Self-pay | Admitting: *Deleted

## 2021-11-29 NOTE — Telephone Encounter (Signed)
   Name: [name] [name].  DOB: 15-Mar-1941  MRN: [id number]  Primary Cardiologist: [name], MD   Preoperative team, please contact this patient and set up a phone call appointment for further preoperative risk assessment. Please obtain consent and complete medication review. Thank you for your help.  I confirm that guidance regarding antiplatelet and oral anticoagulation therapy has been completed and, if necessary, noted below.  Per office protocol, if patient remains asymptomatic, he may hold aspirin for 7 days prior to procedure.  Please resume aspirin as soon as possible postprocedure, at the discretion of the surgeon.  If fish oil needs to be held, it may be held as well.  Lenna Sciara, NP 11/29/2021, 5:09 PM [facility] [address] [place], [place] [address]

## 2021-11-29 NOTE — Telephone Encounter (Signed)
   Pre-operative Risk Assessment    Patient Name: [name] [name].  DOB: [birth year] MRN: [id number]      Request for Surgical Clearance    Procedure:   Anterior Cervical Fusion  Date of Surgery:  Clearance 12/17/21                                 Surgeon:  Dr. Kary Kos Surgeon's Group or Practice Name:  [facility] Phone number:  [phone] Fax number:  [phone]   Type of Clearance Requested:   - Medical  - Pharmacy:  Hold Aspirin and Fish Oil - up to our office how long to hold medication   Type of Anesthesia:  General    Additional requests/questions:  Please fax a copy of clearance to the surgeon's office.  [name]   11/29/2021, 4:59 PM

## 2021-11-29 NOTE — Telephone Encounter (Signed)
Pt agreeable to plan of care for tele pre op appt 12/06/21 @ 10:20. Med rec and consent are done.     Patient Consent for Virtual Visit        [name] Voong. has provided verbal consent on 11/29/2021 for a virtual visit (video or telephone).   CONSENT FOR VIRTUAL VISIT FOR:  [name] [name].  By participating in this virtual visit I agree to the following:  I hereby voluntarily request, consent and authorize [facility] and its employed or contracted physicians, physician assistants, nurse practitioners or other licensed health care professionals (the Practitioner), to provide me with telemedicine health care services (the "Services") as deemed necessary by the treating Practitioner. I acknowledge and consent to receive the Services by the Practitioner via telemedicine. I understand that the telemedicine visit will involve communicating with the Practitioner through live audiovisual communication technology and the disclosure of certain medical information by electronic transmission. I acknowledge that I have been given the opportunity to request an in-person assessment or other available alternative prior to the telemedicine visit and am voluntarily participating in the telemedicine visit.  I understand that I have the right to withhold or withdraw my consent to the use of telemedicine in the course of my care at any time, without affecting my right to future care or treatment, and that the Practitioner or I may terminate the telemedicine visit at any time. I understand that I have the right to inspect all information obtained and/or recorded in the course of the telemedicine visit and may receive copies of available information for a reasonable fee.  I understand that some of the potential risks of receiving the Services via telemedicine include:  Delay or interruption in medical evaluation due to technological equipment failure or disruption; Information transmitted may not be sufficient  (e.g. poor resolution of images) to allow for appropriate medical decision making by the Practitioner; and/or  In rare instances, security protocols could fail, causing a breach of personal health information.  Furthermore, I acknowledge that it is my responsibility to provide information about my medical history, conditions and care that is complete and accurate to the best of my ability. I acknowledge that Practitioner's advice, recommendations, and/or decision may be based on factors not within their control, such as incomplete or inaccurate data provided by me or distortions of diagnostic images or specimens that may result from electronic transmissions. I understand that the practice of medicine is not an exact science and that Practitioner makes no warranties or guarantees regarding treatment outcomes. I acknowledge that a copy of this consent can be made available to me via my patient portal ([facility]), or I can request a printed copy by calling the office of [facility].    I understand that my insurance will be billed for this visit.   I have read or had this consent read to me. I understand the contents of this consent, which adequately explains the benefits and risks of the Services being provided via telemedicine.  I have been provided ample opportunity to ask questions regarding this consent and the Services and have had my questions answered to my satisfaction. I give my informed consent for the services to be provided through the use of telemedicine in my medical care

## 2021-11-29 NOTE — Telephone Encounter (Signed)
Pt agreeable to plan of care for tele pre op appt 12/06/21 @ 10:20. Med rec and consent are done.

## 2021-12-01 ENCOUNTER — Other Ambulatory Visit: Payer: Self-pay | Admitting: Neurosurgery

## 2021-12-01 ENCOUNTER — Telehealth: Payer: Self-pay

## 2021-12-01 NOTE — Telephone Encounter (Signed)
   Pre-operative Risk Assessment    Patient Name: [name] [name].  DOB: [birth year] MRN: [id number]      Request for Surgical Clearance    Procedure: TBD  Date of Surgery:  Clearance pending clearance                                 Surgeon:  Dr. Chevis Pretty Group or Practice Name:  [facility] Phone number:  [phone] Fax number:  [phone]    Type of Clearance Requested:   - Medical    Type of Anesthesia:  General    Additional requests/questions:  Please fax a copy of [id number] to the surgeon's office.  Signed, Jeanmarie Plant Lorrene Graef CCMA  12/01/2021, 1:23 PM

## 2021-12-03 ENCOUNTER — Telehealth: Payer: Self-pay | Admitting: *Deleted

## 2021-12-03 NOTE — Telephone Encounter (Signed)
   Name: [name] [name].  DOB: [birth year]  MRN: [id number]  Primary Cardiologist: [name], MD  Chart reviewed as part of pre-operative protocol coverage.  This appears to be a duplicate request. The patient has an upcoming visit scheduled with preop-APP on 12/06/2021 to address surgical clearance.   I will route this message as FYI to requesting party and remove this message from the preop box as separate preop APP input not needed at this time.   Please call with any questions.  Lenna Sciara, NP  12/03/2021, 12:22 PM

## 2021-12-03 NOTE — Telephone Encounter (Signed)
   Pre-operative Risk Assessment    Patient Name: [name] [name].  DOB: 05/28/1940 MRN: [id number]      Request for Surgical Clearance    Procedure:   C3-4 Anterior Cervical   Fusion   Date of Surgery:  Clearance 12/17/21                                 Surgeon:  Dr  Kary Kos  Surgeon's Group or Practice Name:   [facility]  Phone number:  [phone] Fax number:  [phone]   Type of Clearance Requested:   - Medical  - Pharmacy:  Hold Aspirin unknown   Type of Anesthesia:  General    Additional requests/questions:  Please advise surgeon/provider what medications should be held.  Olin Pia   12/03/2021, 11:29 AM

## 2021-12-06 ENCOUNTER — Ambulatory Visit (INDEPENDENT_AMBULATORY_CARE_PROVIDER_SITE_OTHER): Payer: Medicare HMO | Admitting: Nurse Practitioner

## 2021-12-06 DIAGNOSIS — Z01810 Encounter for preprocedural cardiovascular examination: Secondary | ICD-10-CM | POA: Diagnosis not present

## 2021-12-06 NOTE — Progress Notes (Signed)
Virtual Visit via Telephone Note   Because of [name] SCALLON Jr.'s co-morbid illnesses, he is at least at moderate risk for complications without adequate follow up.  This format is felt to be most appropriate for this patient at this time.  The patient did not have access to video technology/had technical difficulties with video requiring transitioning to audio format only (telephone).  All issues noted in this document were discussed and addressed.  No physical exam could be performed with this format.  Please refer to the patient's chart for his consent to telehealth for [facility].  Evaluation Performed:  Preoperative cardiovascular risk assessment _____________   Date:  12/06/2021   Patient ID:  [name] Age., DOB Jul 16, 1940, MRN [id number] Patient Location:  Home Provider location:   Office  Primary Care Provider:  Mayra Neer, MD Primary Cardiologist:  [name], MD  Chief Complaint / Patient Profile   81 y.o. y/o male with a h/o CAD s/p CABG x3 2001, HTN, HLD,PSVT, OSA (on CPAP) who is pending anterior cervical fusion and presents today for telephonic preoperative cardiovascular risk assessment.   Past Medical History    Past Medical History:  Diagnosis Date   Arthritis    CAD in native artery 1990   Status post PCI to the RCA --> CABG in 2002:   Dyslipidemia, goal LDL below 70     on statin   Hematuria    OCASIONAL HEMATURIA UNK CAUSE   Hypertension    Hypothyroidism    OSA (obstructive sleep apnea)    AHI-18.3/hr, AHI REM-3.1/hr UNABLE TO ADJUST TO USING C-PAP   PONV (postoperative nausea and vomiting)    S/P CABG x 3 2002   LIMA-LAD, SVG-PDA, SVG-OM.   Past Surgical History:  Procedure Laterality Date   CARDIAC CATHETERIZATION  03/17/2010   Patent LIMA-LAD, small non-intervenable D1 with ostial 70% stenosis; patent SVG-OM with retrograde flow to the native circumflex & 90% stenosis in main Cx. 60-70% lesion in AVG Cx. Patent SVG-RPDA with  retrograde flow filling the PL system.   CARDIAC CATHETERIZATION  03/28/2000   Recommended CABG --> CABG in Jan 2002   CARDIAC CATHETERIZATION  10/21/1988   Severe disease in Mid and Distal RCA. Mid portion-90%, distal portion-80%   CHOLECYSTECTOMY     1997   CORONARY ANGIOPLASTY  12/02/1988   Proximal RCA 95% stenosis -- PTCA inflated with a 2.18m balloon --> 3.0 mm Piccolino balloon resulting in a 20-30% irregularity; Distal RCA 90% stenosis --> 2.5 balloon results were normal   CORONARY ANGIOPLASTY  08/04/1988   Distal RCA 95% occluded -> PTCA w/ 2.5 mm balloon -->.  30% irregularity   CORONARY ARTERY BYPASS GRAFT  Jan 2002   3 VESSELS 2002; LIMA-LAD, SVG-OM, SVG to RPDA.   JOINT REPLACEMENT     NM MYOVIEW LTD  September 2011   mild ischemia in basal inerolateral, mid inferolateral, and apical regions, post-stress EF 61%, Low-Risk Scan;   TOTAL KNEE ARTHROPLASTY  03/21/2011   Procedure: TOTAL KNEE ARTHROPLASTY;  Surgeon: FGearlean Alf  Location: [facility];  Service: Orthopedics;  Laterality: Left;   TOTAL KNEE ARTHROPLASTY  06/01/2012   Procedure: TOTAL KNEE ARTHROPLASTY;  Surgeon: FGearlean Alf MD;  Location: [facility];  Service: Orthopedics;  Laterality: Right;    Allergies  Allergies  Allergen Reactions   Contrast Media [Iodinated Contrast Media] Other (See Comments)    Unknown    Crestor [Rosuvastatin]     SEVERE LEG CRAMPS  Sulfa Antibiotics     REACTION WAS YRS AGO - SWELLING, SKIN REDNESS   Bactrim [Sulfamethoxazole-Trimethoprim] Rash   Doxycycline Rash    History of Present Illness    [name] Hoffert. is a 81 y.o. male who presents via audio/video conferencing for a telehealth visit today.  Pt was last seen in cardiology clinic on 03/2021 by Dr. Ellyn Hack.  At that time [name] Age. was doing well with no cardiac symptoms with the exception of dizziness from enalapril.  No medication changes were made at that time and patient was advised to hold enalapril if  dizziness persisted.  The patient is now pending procedure as outlined above. Since his last visit, he has  been doing well with no heart complaints and. He denies chest pain, SOB. He does have occasional fast heart beats but states they are asymptomatic and last for 4-5 minutes and is relieved by valsalva or drinking ice water. He does report a low blood pressure reading that occurred last week at 95/64 but was isolated. His blood pressure today was noted to be in the 633'H systolic. He has no other concerns at this time.  Home Medications    Prior to Admission medications   Medication Sig Start Date End Date Taking? Authorizing Provider  Alpha-Lipoic Acid 600 MG CAPS Take 1 capsule by mouth every morning.    [provider]  aspirin 81 MG tablet Take 81 mg by mouth daily.    [provider]  atorvastatin (LIPITOR) 20 MG tablet TAKE 1 TABLET EVERY DAY AT 6 P.M. Patient taking differently: Take 10 mg by mouth daily. 09/02/21   Leonie Man, MD  Coenzyme Q10 (CO Q 10 PO) Take by mouth.    [provider]  COVID-19 mRNA bivalent vaccine, Pfizer, (PFIZER COVID-19 VAC BIVALENT) injection Inject into the muscle. 03/09/21   Carlyle Basques, MD  Cyanocobalamin (B-12) 2500 MCG SUBL Place 1 tablet under the tongue daily.    [provider]  enalapril (VASOTEC) 20 MG tablet Take 1 tablet (20 mg total) by mouth at bedtime. 01/06/14   Leonie Man, MD  levothyroxine (SYNTHROID, LEVOTHROID) 125 MCG tablet Take 125 mcg by mouth. AM BEFORE BREAKFAST    [provider]  metoprolol succinate (TOPROL-XL) 25 MG 24 hr tablet TAKE 1 TABLET EVERY DAY (NEED MD APPOINTMENT) 12/17/20   Lorretta Harp, MD  Multiple Vitamin (MULTIVITAMIN) tablet Take 1 tablet by mouth daily. Patient not taking: Reported on 11/29/2021    [provider]  NON FORMULARY CPAP MACHINE    [provider]  Omega-3 Fatty Acids (FISH OIL PO) Take by mouth.    [provider]  tamsulosin (FLOMAX) 0.4 MG CAPS capsule Take 0.4 mg by mouth daily. 02/08/21   [provider]    Physical Exam    Vital Signs:  [name] Age. does not have vital signs available for review today.1 B/P: 124/70  Heart rate: 78  Given telephonic nature of communication, physical exam is limited. AAOx3. NAD. Normal affect.  Speech and respirations are unlabored.  Accessory Clinical Findings    None  Assessment & Plan    1.  Preoperative Cardiovascular Risk Assessment:     Mr. Blitch perioperative risk of a major cardiac event is 0.9% according to the Revised Cardiac Risk Index (RCRI).  Therefore, he is at low risk for perioperative complications.   His functional capacity is good at 6.05 METs according to the [facility] Activity  Status Index (DASI). Recommendations: According to ACC/AHA guidelines, no further cardiovascular testing needed.  The patient may proceed to surgery at acceptable risk.   Antiplatelet and/or Anticoagulation Recommendations: Aspirin can be held for 7 days prior to his surgery.  Please resume Aspirin post operatively when it is felt to be safe from a bleeding standpoint.    A copy of this note will be routed to requesting surgeon.  Time:   Today, I have spent 14 minutes with the patient with telehealth technology discussing medical history, symptoms, and management plan.     Mable Fill, Marissa Nestle, NP  12/06/2021, 8:31 AM

## 2021-12-07 DIAGNOSIS — D225 Melanocytic nevi of trunk: Secondary | ICD-10-CM | POA: Diagnosis not present

## 2021-12-07 DIAGNOSIS — D2261 Melanocytic nevi of right upper limb, including shoulder: Secondary | ICD-10-CM | POA: Diagnosis not present

## 2021-12-07 DIAGNOSIS — L821 Other seborrheic keratosis: Secondary | ICD-10-CM | POA: Diagnosis not present

## 2021-12-07 DIAGNOSIS — D0461 Carcinoma in situ of skin of right upper limb, including shoulder: Secondary | ICD-10-CM | POA: Diagnosis not present

## 2021-12-07 DIAGNOSIS — Z85828 Personal history of other malignant neoplasm of skin: Secondary | ICD-10-CM | POA: Diagnosis not present

## 2021-12-07 DIAGNOSIS — L570 Actinic keratosis: Secondary | ICD-10-CM | POA: Diagnosis not present

## 2021-12-07 DIAGNOSIS — L814 Other melanin hyperpigmentation: Secondary | ICD-10-CM | POA: Diagnosis not present

## 2021-12-10 NOTE — Progress Notes (Signed)
Surgical Instructions    Your procedure is scheduled on Friday, 12/17/21.  Report to [facility] Main Entrance "A" at 5:30 A.M., then check in with the Admitting office.  Call this number if you have problems the morning of surgery:  [phone]   If you have any questions prior to your surgery date call [phone]: Open Monday-Friday 8am-4pm    Remember:  Do not eat or drink after midnight the night before your surgery     Take these medicines the morning of surgery with A SIP OF WATER:  atorvastatin (LIPITOR) levothyroxine (SYNTHROID, LEVOTHROID)  metoprolol succinate (TOPROL-XL) tamsulosin (FLOMAX)   IF NEEDED: polyvinyl alcohol (LIQUIFILM TEARS)  As of today, STOP taking any Aspirin (unless otherwise instructed by your surgeon) Aleve, Naproxen, Ibuprofen, Motrin, Advil, Goody's, BC's, all herbal medications, fish oil, and all vitamins.           Do not wear jewelry or makeup. Do not wear lotions, powders, perfumes/cologne or deodorant. Men may shave face and neck. Do not bring valuables to the hospital. Do not wear nail polish, gel polish, artificial nails, or any other type of covering on natural nails (fingers and toes) If you have artificial nails or gel coating that need to be removed by a nail salon, please have this removed prior to surgery. Artificial nails or gel coating may interfere with anesthesia's ability to adequately monitor your vital signs.  [facility] is not responsible for any belongings or valuables.    Do NOT Smoke (Tobacco/Vaping)  24 hours prior to your procedure  If you use a CPAP at night, you may bring your mask for your overnight stay.   Contacts, glasses, hearing aids, dentures or partials may not be worn into surgery, please bring cases for these belongings   For patients admitted to the hospital, discharge time will be determined by your treatment team.   Patients discharged the day of surgery will not be allowed to drive home, and  someone needs to stay with them for 24 hours.   SURGICAL WAITING ROOM VISITATION Patients having surgery or a procedure may have no more than 2 support people in the waiting area - these visitors may rotate.   Children under the age of 108 must have an adult with them who is not the patient. If the patient needs to stay at the hospital during part of their recovery, the visitor guidelines for inpatient rooms apply. Pre-op nurse will coordinate an appropriate time for 1 support person to accompany patient in pre-op.  This support person may not rotate.   Please refer to the [facility] website for the visitor guidelines for Inpatients (after your surgery is over and you are in a regular room).    Special instructions:    Oral Hygiene is also important to reduce your risk of infection.  Remember - BRUSH YOUR TEETH THE MORNING OF SURGERY WITH YOUR REGULAR TOOTHPASTE   [facility]- Preparing For Surgery  Before surgery, you can play an important role. Because skin is not sterile, your skin needs to be as free of germs as possible. You can reduce the number of germs on your skin by washing with CHG (chlorahexidine gluconate) Soap before surgery.  CHG is an antiseptic cleaner which kills germs and bonds with the skin to continue killing germs even after washing.     Please do not use if you have an allergy to CHG or antibacterial soaps. If your skin becomes reddened/irritated stop using the CHG.  Do not  shave (including legs and underarms) for at least 48 hours prior to first CHG shower. It is OK to shave your face.  Please follow these instructions carefully.     Shower the NIGHT BEFORE SURGERY and the MORNING OF SURGERY with CHG Soap.   If you chose to wash your hair, wash your hair first as usual with your normal shampoo. After you shampoo, rinse your hair and body thoroughly to remove the shampoo.  Then [employer] and genitals (private parts) with your normal soap and rinse thoroughly to  remove soap.  After that Use CHG Soap as you would any other liquid soap. You can apply CHG directly to the skin and wash gently with a scrungie or a clean washcloth.   Apply the CHG Soap to your body ONLY FROM THE NECK DOWN.  Do not use on open wounds or open sores. Avoid contact with your eyes, ears, mouth and genitals (private parts). Wash Face and genitals (private parts)  with your normal soap.   Wash thoroughly, paying special attention to the area where your surgery will be performed.  Thoroughly rinse your body with warm water from the neck down.  DO NOT shower/wash with your normal soap after using and rinsing off the CHG Soap.  Pat yourself dry with a CLEAN TOWEL.  Wear CLEAN PAJAMAS to bed the night before surgery  Place CLEAN SHEETS on your bed the night before your surgery  DO NOT SLEEP WITH PETS.   Day of Surgery: Take a shower with CHG soap. Wear Clean/Comfortable clothing the morning of surgery Do not apply any deodorants/lotions.   Remember to brush your teeth WITH YOUR REGULAR TOOTHPASTE.    If you received a COVID test during your pre-op visit, it is requested that you wear a mask when out in public, stay away from anyone that may not be feeling well, and notify your surgeon if you develop symptoms. If you have been in contact with anyone that has tested positive in the last 10 days, please notify your surgeon.    Please read over the following fact sheets that you were given.

## 2021-12-13 ENCOUNTER — Encounter (HOSPITAL_COMMUNITY)
Admission: RE | Admit: 2021-12-13 | Discharge: 2021-12-13 | Disposition: A | Discharge status: Home / Self Care | Payer: Medicare HMO | Source: Ambulatory Visit | Attending: Neurosurgery | Admitting: Neurosurgery

## 2021-12-13 ENCOUNTER — Encounter (HOSPITAL_COMMUNITY): Payer: Self-pay

## 2021-12-13 ENCOUNTER — Other Ambulatory Visit: Payer: Self-pay

## 2021-12-13 VITALS — BP 176/73 | HR 69 | Temp 98.2°F | Resp 17 | Ht 68.0 in | Wt 156.3 lb

## 2021-12-13 DIAGNOSIS — I10 Essential (primary) hypertension: Secondary | ICD-10-CM | POA: Diagnosis not present

## 2021-12-13 DIAGNOSIS — I471 Supraventricular tachycardia: Secondary | ICD-10-CM | POA: Insufficient documentation

## 2021-12-13 DIAGNOSIS — Z01818 Encounter for other preprocedural examination: Secondary | ICD-10-CM

## 2021-12-13 DIAGNOSIS — E785 Hyperlipidemia, unspecified: Secondary | ICD-10-CM | POA: Insufficient documentation

## 2021-12-13 DIAGNOSIS — Z951 Presence of aortocoronary bypass graft: Secondary | ICD-10-CM | POA: Diagnosis not present

## 2021-12-13 DIAGNOSIS — Z01812 Encounter for preprocedural laboratory examination: Secondary | ICD-10-CM | POA: Insufficient documentation

## 2021-12-13 DIAGNOSIS — G4733 Obstructive sleep apnea (adult) (pediatric): Secondary | ICD-10-CM | POA: Diagnosis not present

## 2021-12-13 DIAGNOSIS — I2510 Atherosclerotic heart disease of native coronary artery without angina pectoris: Secondary | ICD-10-CM | POA: Diagnosis not present

## 2021-12-13 HISTORY — DX: Personal history of urinary calculi: Z87.442

## 2021-12-13 LAB — CBC
HCT: 36.4 % — ABNORMAL LOW (ref 39.0–52.0)
Hemoglobin: 12.5 g/dL — ABNORMAL LOW (ref 13.0–17.0)
MCH: 31.5 pg (ref 26.0–34.0)
MCHC: 34.3 g/dL (ref 30.0–36.0)
MCV: 91.7 fL (ref 80.0–100.0)
Platelets: 196 10*3/uL (ref 150–400)
RBC: 3.97 MIL/uL — ABNORMAL LOW (ref 4.22–5.81)
RDW: 12.1 % (ref 11.5–15.5)
WBC: 5.6 10*3/uL (ref 4.0–10.5)
nRBC: 0 % (ref 0.0–0.2)

## 2021-12-13 LAB — BASIC METABOLIC PANEL
Anion gap: 8 (ref 5–15)
BUN: 21 mg/dL (ref 8–23)
CO2: 26 mmol/L (ref 22–32)
Calcium: 9.8 mg/dL (ref 8.9–10.3)
Chloride: 103 mmol/L (ref 98–111)
Creatinine, Ser: 0.87 mg/dL (ref 0.61–1.24)
GFR, Estimated: >60 mL/min (ref >60)
Glucose, Bld: 107 mg/dL — ABNORMAL HIGH (ref 70–99)
Potassium: 4.2 mmol/L (ref 3.5–5.1)
Sodium: 137 mmol/L (ref 135–145)

## 2021-12-13 LAB — SURGICAL PCR SCREEN
MRSA, PCR: NEGATIVE
Staphylococcus aureus: NEGATIVE

## 2021-12-13 NOTE — Progress Notes (Signed)
PCP - Nathen May Shaw,MD Cardiologist - [name], MD  PPM/ICD - denies Device Orders -  Rep Notified -   Chest x-ray - none  EKG - 03/30/21 Stress Test - 2011 ECHO - none Cardiac Cath - 1990  Sleep Study - no results found CPAP - yes  Fasting Blood Sugar - na Checks Blood Sugar _____ times a day  Blood Thinner Instructions:na Aspirin Instructions:pt reported his last dose of aspirin was August 4.   ERAS Protcol -no PRE-SURGERY Ensure or G2-   COVID TEST- no   Anesthesia review: yes - preop cardiac clearance  Patient denies shortness of breath, fever, cough and chest pain at PAT appointment   All instructions explained to the patient, with a verbal understanding of the material. Patient agrees to go over the instructions while at home for a better understanding. Patient also instructed to wear a mask when out in public prior to surgery.  The opportunity to ask questions was provided.

## 2021-12-14 DIAGNOSIS — M5001 Cervical disc disorder with myelopathy,  high cervical region: Secondary | ICD-10-CM | POA: Diagnosis not present

## 2021-12-14 NOTE — Progress Notes (Signed)
Anesthesia Chart Review:  Follows with cardiology for history of CAD s/p CABG x 3 in 2001, HTN, HLD, PSVT, OSA on CPAP.  Last cath 2011 with patent grafts.  Seen by Ambrose Pancoast, NP 12/06/2021 via televisit for preop evaluation.  Per note, "Mr. Huseman's perioperative risk of a major cardiac event is 0.9% according to the Revised Cardiac Risk Index (RCRI).  Therefore, he is at low risk for perioperative complications.   His functional capacity is good at 6.05 METs according to the [facility] Activity Status Index (DASI). Recommendations: According to ACC/AHA guidelines, no further cardiovascular testing needed.  The patient may proceed to surgery at acceptable risk.  Antiplatelet and/or Anticoagulation Recommendations: Aspirin can be held for 7 days prior to his surgery.  Please resume Aspirin post operatively when it is felt to be safe from a bleeding standpoint. "  Preop labs reviewed, mild anemia with hemoglobin 12.5, otherwise unremarkable.  EKG 03/30/2021: Sinus bradycardia.  Rate 54.  LAD.  Right bundle branch block.  Minimal voltage criteria for LVH, may be normal variant.  Septal infarct, age undetermined.   Wynonia Musty [facility]/Anesthesiology Phone [phone] 12/14/2021 9:54 AM

## 2021-12-14 NOTE — Anesthesia Preprocedure Evaluation (Addendum)
Anesthesia Evaluation  Patient identified by MRN, date of birth, ID band Patient awake    Reviewed: Allergy & Precautions, NPO status , Patient's Chart, lab work & pertinent test results  History of Anesthesia Complications (+) PONV and history of anesthetic complications  Airway Mallampati: I       Dental no notable dental hx.    Pulmonary former smoker,    Pulmonary exam normal        Cardiovascular hypertension, Pt. on home beta blockers + CAD and + CABG  Normal cardiovascular exam     Neuro/Psych negative neurological ROS  negative psych ROS   GI/Hepatic negative GI ROS, Neg liver ROS,   Endo/Other  Hypothyroidism   Renal/GU   negative genitourinary   Musculoskeletal  (+) Arthritis , Osteoarthritis,    Abdominal Normal abdominal exam  (+)   Peds  Hematology  (+) Blood dyscrasia, anemia ,   Anesthesia Other Findings   Reproductive/Obstetrics                            Anesthesia Physical Anesthesia Plan  ASA: 3  Anesthesia Plan: General   Post-op Pain Management:    Induction: Intravenous  PONV Risk Score and Plan: 4 or greater and Ondansetron, Dexamethasone and Treatment may vary due to age or medical condition  Airway Management Planned: Oral ETT  Additional Equipment: None  Intra-op Plan:   Post-operative Plan: Extubation in OR  Informed Consent: I have reviewed the patients History and Physical, chart, labs and discussed the procedure including the risks, benefits and alternatives for the proposed anesthesia with the patient or authorized representative who has indicated his/her understanding and acceptance.     Dental advisory given  Plan Discussed with: CRNA  Anesthesia Plan Comments: (PAT note by Karoline Caldwell, PA-C: Follows with cardiology for history of CAD s/p CABG x 3 in 2001, HTN, HLD, PSVT, OSA on CPAP.  Last cath 2011 with patent grafts.  Seen by Ambrose Pancoast, NP 12/06/2021 via televisit for preop evaluation.  Per note, "Mr.Koltz's perioperative risk of a major cardiac event is0.9% according to the Revised Cardiac Risk Index (RCRI). Therefore, heis at [facility] for perioperative complications. Hisfunctional capacity is goodat 6.05METs according to the [facility] Activity Status Index (DASI). Recommendations: According to ACC/AHA guidelines, no further cardiovascular testing needed. The patient may proceed to surgery at acceptable risk. Antiplatelet and/or Anticoagulation Recommendations: Aspirin can be held for7days prior to hissurgery. Please resume Aspirin post operatively when it is felt to be safe from a bleeding standpoint. "  Preop labs reviewed, mild anemia with hemoglobin 12.5, otherwise unremarkable.  EKG 03/30/2021: Sinus bradycardia.  Rate 54.  LAD.  Right bundle branch block.  Minimal voltage criteria for LVH, may be normal variant.  Septal infarct, age undetermined. )       Anesthesia Quick Evaluation

## 2021-12-16 ENCOUNTER — Telehealth: Payer: Self-pay

## 2021-12-16 NOTE — Telephone Encounter (Signed)
A user error has taken place: encounter opened in error, closed for administrative reasons.

## 2021-12-17 ENCOUNTER — Other Ambulatory Visit: Payer: Self-pay

## 2021-12-17 ENCOUNTER — Ambulatory Visit (HOSPITAL_BASED_OUTPATIENT_CLINIC_OR_DEPARTMENT_OTHER): Payer: Medicare HMO | Admitting: Certified Registered"

## 2021-12-17 ENCOUNTER — Ambulatory Visit (HOSPITAL_COMMUNITY)
Admission: RE | Disposition: A | Discharge status: Home / Self Care | Payer: Self-pay | Source: Home / Self Care | Attending: Neurosurgery

## 2021-12-17 ENCOUNTER — Ambulatory Visit (HOSPITAL_COMMUNITY): Payer: Medicare HMO | Admitting: Physician Assistant

## 2021-12-17 ENCOUNTER — Ambulatory Visit (HOSPITAL_COMMUNITY): Payer: Medicare HMO

## 2021-12-17 ENCOUNTER — Ambulatory Visit (HOSPITAL_COMMUNITY)
Admission: RE | Admit: 2021-12-17 | Discharge: 2021-12-18 | Disposition: A | Discharge status: Home / Self Care | Payer: Medicare HMO | Attending: Neurosurgery | Admitting: Neurosurgery

## 2021-12-17 DIAGNOSIS — I10 Essential (primary) hypertension: Secondary | ICD-10-CM | POA: Insufficient documentation

## 2021-12-17 DIAGNOSIS — M4712 Other spondylosis with myelopathy, cervical region: Secondary | ICD-10-CM | POA: Diagnosis not present

## 2021-12-17 DIAGNOSIS — M5001 Cervical disc disorder with myelopathy,  high cervical region: Secondary | ICD-10-CM | POA: Insufficient documentation

## 2021-12-17 DIAGNOSIS — Z951 Presence of aortocoronary bypass graft: Secondary | ICD-10-CM | POA: Diagnosis not present

## 2021-12-17 DIAGNOSIS — R2681 Unsteadiness on feet: Secondary | ICD-10-CM | POA: Insufficient documentation

## 2021-12-17 DIAGNOSIS — Z87891 Personal history of nicotine dependence: Secondary | ICD-10-CM

## 2021-12-17 DIAGNOSIS — Z981 Arthrodesis status: Secondary | ICD-10-CM | POA: Diagnosis not present

## 2021-12-17 DIAGNOSIS — G9520 Unspecified cord compression: Secondary | ICD-10-CM

## 2021-12-17 DIAGNOSIS — I2510 Atherosclerotic heart disease of native coronary artery without angina pectoris: Secondary | ICD-10-CM | POA: Diagnosis not present

## 2021-12-17 DIAGNOSIS — M1990 Unspecified osteoarthritis, unspecified site: Secondary | ICD-10-CM | POA: Diagnosis not present

## 2021-12-17 DIAGNOSIS — M4722 Other spondylosis with radiculopathy, cervical region: Secondary | ICD-10-CM

## 2021-12-17 DIAGNOSIS — E039 Hypothyroidism, unspecified: Secondary | ICD-10-CM | POA: Diagnosis not present

## 2021-12-17 DIAGNOSIS — G959 Disease of spinal cord, unspecified: Secondary | ICD-10-CM | POA: Diagnosis present

## 2021-12-17 DIAGNOSIS — M5000 Cervical disc disorder with myelopathy, unspecified cervical region: Secondary | ICD-10-CM | POA: Diagnosis present

## 2021-12-17 DIAGNOSIS — M4322 Fusion of spine, cervical region: Secondary | ICD-10-CM | POA: Diagnosis not present

## 2021-12-17 DIAGNOSIS — Z79899 Other long term (current) drug therapy: Secondary | ICD-10-CM | POA: Diagnosis not present

## 2021-12-17 HISTORY — PX: ANTERIOR CERVICAL DECOMP/DISCECTOMY FUSION: SHX1161

## 2021-12-17 SURGERY — ANTERIOR CERVICAL DECOMPRESSION/DISCECTOMY FUSION 1 LEVEL
Anesthesia: General

## 2021-12-17 MED ORDER — FENTANYL CITRATE (PF) 250 MCG/5ML IJ SOLN
INTRAMUSCULAR | Status: AC
  Filled 2021-12-17: qty 5

## 2021-12-17 MED ORDER — ACETAMINOPHEN 650 MG RE SUPP: 650.0000 mg | RECTAL | Status: DC | PRN

## 2021-12-17 MED ORDER — SODIUM CHLORIDE 0.9% FLUSH: 3.0000 mL | INTRAVENOUS | Status: DC | PRN

## 2021-12-17 MED ORDER — LIDOCAINE 2% (20 MG/ML) 5 ML SYRINGE
INTRAMUSCULAR | Status: DC | PRN
  Administered 2021-12-17: 60 mg via INTRAVENOUS

## 2021-12-17 MED ORDER — ONDANSETRON HCL 4 MG/2ML IJ SOLN: 4.0000 mg | Freq: Four times a day (QID) | INTRAMUSCULAR | Status: DC | PRN

## 2021-12-17 MED ORDER — CYCLOBENZAPRINE HCL 10 MG PO TABS
10.0000 mg | ORAL_TABLET | Freq: Three times a day (TID) | ORAL | Status: DC | PRN
  Administered 2021-12-17 (×2): 10 mg via ORAL
  Filled 2021-12-17 (×2): qty 1

## 2021-12-17 MED ORDER — CEFAZOLIN SODIUM-DEXTROSE 2-4 GM/100ML-% IV SOLN
2.0000 g | Freq: Three times a day (TID) | INTRAVENOUS | Status: AC
  Administered 2021-12-17 (×2): 2 g via INTRAVENOUS
  Filled 2021-12-17 (×2): qty 100

## 2021-12-17 MED ORDER — SODIUM CHLORIDE 0.9 % IV SOLN
250.0000 mL | INTRAVENOUS | Status: DC
  Administered 2021-12-17: 250 mL via INTRAVENOUS

## 2021-12-17 MED ORDER — ASPIRIN 81 MG PO CHEW
81.0000 mg | CHEWABLE_TABLET | Freq: Every day | ORAL | Status: DC
  Administered 2021-12-17 – 2021-12-18 (×2): 81 mg via ORAL
  Filled 2021-12-17 (×2): qty 1

## 2021-12-17 MED ORDER — SODIUM CHLORIDE 0.9% FLUSH
3.0000 mL | Freq: Two times a day (BID) | INTRAVENOUS | Status: DC
  Administered 2021-12-17 (×2): 3 mL via INTRAVENOUS

## 2021-12-17 MED ORDER — ALUM & MAG HYDROXIDE-SIMETH 200-200-20 MG/5ML PO SUSP: 30.0000 mL | Freq: Four times a day (QID) | ORAL | Status: DC | PRN

## 2021-12-17 MED ORDER — DEXAMETHASONE SODIUM PHOSPHATE 10 MG/ML IJ SOLN
INTRAMUSCULAR | Status: DC | PRN
  Administered 2021-12-17: 10 mg via INTRAVENOUS

## 2021-12-17 MED ORDER — FENTANYL CITRATE (PF) 100 MCG/2ML IJ SOLN
25.0000 ug | INTRAMUSCULAR | Status: DC | PRN
  Administered 2021-12-17: 50 ug via INTRAVENOUS

## 2021-12-17 MED ORDER — LACTATED RINGERS IV SOLN: INTRAVENOUS | Status: DC | PRN

## 2021-12-17 MED ORDER — ENALAPRIL MALEATE 10 MG PO TABS
20.0000 mg | ORAL_TABLET | Freq: Every day | ORAL | Status: DC
  Administered 2021-12-17: 20 mg via ORAL
  Filled 2021-12-17: qty 2

## 2021-12-17 MED ORDER — PROPOFOL 10 MG/ML IV BOLUS
INTRAVENOUS | Status: AC
  Filled 2021-12-17: qty 20

## 2021-12-17 MED ORDER — PHENYLEPHRINE HCL-NACL 20-0.9 MG/250ML-% IV SOLN
INTRAVENOUS | Status: DC | PRN
  Administered 2021-12-17: 25 ug/min via INTRAVENOUS

## 2021-12-17 MED ORDER — MENTHOL 3 MG MT LOZG
1.0000 | LOZENGE | OROMUCOSAL | Status: DC | PRN
Start: 2021-12-17 — End: 2021-12-18

## 2021-12-17 MED ORDER — PROPOFOL 10 MG/ML IV BOLUS
INTRAVENOUS | Status: DC | PRN
  Administered 2021-12-17: 100 mg via INTRAVENOUS

## 2021-12-17 MED ORDER — CHLORHEXIDINE GLUCONATE CLOTH 2 % EX PADS: 6.0000 | MEDICATED_PAD | Freq: Once | CUTANEOUS | Status: DC

## 2021-12-17 MED ORDER — 0.9 % SODIUM CHLORIDE (POUR BTL) OPTIME
TOPICAL | Status: DC | PRN
  Administered 2021-12-17: 1000 mL

## 2021-12-17 MED ORDER — TAMSULOSIN HCL 0.4 MG PO CAPS
0.4000 mg | ORAL_CAPSULE | Freq: Every day | ORAL | Status: DC
  Administered 2021-12-18: 0.4 mg via ORAL
  Filled 2021-12-17: qty 1

## 2021-12-17 MED ORDER — METOPROLOL SUCCINATE ER 25 MG PO TB24
25.0000 mg | ORAL_TABLET | Freq: Every day | ORAL | Status: DC
  Administered 2021-12-18: 25 mg via ORAL
  Filled 2021-12-17: qty 1

## 2021-12-17 MED ORDER — OMEGA-3-ACID ETHYL ESTERS 1 G PO CAPS
1.0000 g | ORAL_CAPSULE | Freq: Every day | ORAL | Status: DC
  Administered 2021-12-17 – 2021-12-18 (×2): 1 g via ORAL
  Filled 2021-12-17 (×2): qty 1

## 2021-12-17 MED ORDER — CEFAZOLIN SODIUM-DEXTROSE 2-4 GM/100ML-% IV SOLN
2.0000 g | INTRAVENOUS | Status: AC
  Administered 2021-12-17: 2 g via INTRAVENOUS
  Filled 2021-12-17: qty 100

## 2021-12-17 MED ORDER — THROMBIN 5000 UNITS EX SOLR
CUTANEOUS | Status: AC
  Filled 2021-12-17: qty 15000

## 2021-12-17 MED ORDER — CO Q-10 300 MG PO CAPS: 300.0000 mg | ORAL_CAPSULE | Freq: Every day | ORAL | Status: DC

## 2021-12-17 MED ORDER — ONDANSETRON HCL 4 MG PO TABS
4.0000 mg | ORAL_TABLET | Freq: Four times a day (QID) | ORAL | Status: DC | PRN
Start: 2021-12-17 — End: 2021-12-18

## 2021-12-17 MED ORDER — LACTATED RINGERS IV SOLN: INTRAVENOUS | Status: DC

## 2021-12-17 MED ORDER — THROMBIN (RECOMBINANT) 5000 UNITS EX SOLR
CUTANEOUS | Status: DC | PRN
  Administered 2021-12-17: 10 mL via TOPICAL

## 2021-12-17 MED ORDER — ACETAMINOPHEN 325 MG PO TABS
650.0000 mg | ORAL_TABLET | ORAL | Status: DC | PRN
  Administered 2021-12-18: 650 mg via ORAL
  Filled 2021-12-17: qty 2

## 2021-12-17 MED ORDER — POLYVINYL ALCOHOL 1.4 % OP SOLN: 1.0000 [drp] | OPHTHALMIC | Status: DC | PRN

## 2021-12-17 MED ORDER — FENTANYL CITRATE (PF) 100 MCG/2ML IJ SOLN
INTRAMUSCULAR | Status: AC
  Filled 2021-12-17: qty 2

## 2021-12-17 MED ORDER — THROMBIN 5000 UNITS EX SOLR
OROMUCOSAL | Status: DC | PRN
  Administered 2021-12-17: 5 mL via TOPICAL

## 2021-12-17 MED ORDER — HYDROCODONE-ACETAMINOPHEN 5-325 MG PO TABS
2.0000 | ORAL_TABLET | ORAL | Status: DC | PRN
  Administered 2021-12-17 (×2): 2 via ORAL
  Filled 2021-12-17 (×2): qty 2

## 2021-12-17 MED ORDER — ONDANSETRON HCL 4 MG/2ML IJ SOLN: 4.0000 mg | Freq: Once | INTRAMUSCULAR | Status: DC | PRN

## 2021-12-17 MED ORDER — SUGAMMADEX SODIUM 200 MG/2ML IV SOLN
INTRAVENOUS | Status: DC | PRN
  Administered 2021-12-17: 200 mg via INTRAVENOUS

## 2021-12-17 MED ORDER — CYCLOBENZAPRINE HCL 10 MG PO TABS
ORAL_TABLET | ORAL | Status: AC
  Filled 2021-12-17: qty 1

## 2021-12-17 MED ORDER — ATORVASTATIN CALCIUM 10 MG PO TABS
10.0000 mg | ORAL_TABLET | Freq: Every day | ORAL | Status: DC
  Administered 2021-12-18: 10 mg via ORAL
  Filled 2021-12-17: qty 1

## 2021-12-17 MED ORDER — HYDROMORPHONE HCL 1 MG/ML IJ SOLN: 0.5000 mg | INTRAMUSCULAR | Status: DC | PRN

## 2021-12-17 MED ORDER — PANTOPRAZOLE SODIUM 40 MG IV SOLR
40.0000 mg | Freq: Every day | INTRAVENOUS | Status: DC
  Administered 2021-12-17: 40 mg via INTRAVENOUS
  Filled 2021-12-17: qty 10

## 2021-12-17 MED ORDER — FENTANYL CITRATE (PF) 250 MCG/5ML IJ SOLN
INTRAMUSCULAR | Status: DC | PRN
  Administered 2021-12-17: 100 ug via INTRAVENOUS
  Administered 2021-12-17: 50 ug via INTRAVENOUS

## 2021-12-17 MED ORDER — LEVOTHYROXINE SODIUM 25 MCG PO TABS
125.0000 ug | ORAL_TABLET | Freq: Every day | ORAL | Status: DC
  Administered 2021-12-18: 125 ug via ORAL
  Filled 2021-12-17: qty 1

## 2021-12-17 MED ORDER — PHENOL 1.4 % MT LIQD: 1.0000 | OROMUCOSAL | Status: DC | PRN

## 2021-12-17 MED ORDER — EPHEDRINE SULFATE-NACL 50-0.9 MG/10ML-% IV SOSY
PREFILLED_SYRINGE | INTRAVENOUS | Status: DC | PRN
  Administered 2021-12-17: 5 mg via INTRAVENOUS

## 2021-12-17 MED ORDER — ORAL CARE MOUTH RINSE: 15.0000 mL | Freq: Once | OROMUCOSAL | Status: AC

## 2021-12-17 MED ORDER — CHLORHEXIDINE GLUCONATE 0.12 % MT SOLN
15.0000 mL | Freq: Once | OROMUCOSAL | Status: AC
  Administered 2021-12-17: 15 mL via OROMUCOSAL
  Filled 2021-12-17: qty 15

## 2021-12-17 MED ORDER — ROCURONIUM BROMIDE 10 MG/ML (PF) SYRINGE
PREFILLED_SYRINGE | INTRAVENOUS | Status: DC | PRN
  Administered 2021-12-17: 60 mg via INTRAVENOUS

## 2021-12-17 SURGICAL SUPPLY — 69 items
ADH SKN CLS APL DERMABOND .7 (GAUZE/BANDAGES/DRESSINGS)
APL SKNCLS STERI-STRIP NONHPOA (GAUZE/BANDAGES/DRESSINGS) ×1
BAG COUNTER SPONGE SURGICOUNT (BAG) ×4 IMPLANT
BAG SPNG CNTER NS LX DISP (BAG) ×2
BAND INSRT 18 STRL LF DISP RB (MISCELLANEOUS) ×2
BAND RUBBER #18 3X1/16 STRL (MISCELLANEOUS) ×6 IMPLANT
BASKET BONE COLLECTION (BASKET) ×3 IMPLANT
BENZOIN TINCTURE PRP APPL 2/3 (GAUZE/BANDAGES/DRESSINGS) ×3 IMPLANT
BIT DRILL NEURO 2X3.1 SFT TUCH (MISCELLANEOUS) ×2 IMPLANT
BONE CC-ACS 11X14 X8 6D (Bone Implant) ×2 IMPLANT
BUR MATCHSTICK NEURO 3.0 LAGG (BURR) ×3 IMPLANT
CANISTER SUCT 3000ML PPV (MISCELLANEOUS) ×3 IMPLANT
CARTRIDGE OIL MAESTRO DRILL (MISCELLANEOUS) ×2 IMPLANT
CHIPS BONE CANC-ACS 11X14X8 6D (Bone Implant) IMPLANT
DERMABOND ADVANCED (GAUZE/BANDAGES/DRESSINGS)
DERMABOND ADVANCED .7 DNX12 (GAUZE/BANDAGES/DRESSINGS) IMPLANT
DIFFUSER DRILL AIR PNEUMATIC (MISCELLANEOUS) ×3 IMPLANT
DRAPE C-ARM 42X72 X-RAY (DRAPES) ×6 IMPLANT
DRAPE LAPAROTOMY 100X72 PEDS (DRAPES) ×3 IMPLANT
DRAPE MICROSCOPE LEICA (MISCELLANEOUS) ×3 IMPLANT
DRILL NEURO 2X3.1 SOFT TOUCH (MISCELLANEOUS) ×2
DRSG OPSITE POSTOP 4X6 (GAUZE/BANDAGES/DRESSINGS) ×1 IMPLANT
DURAPREP 6ML APPLICATOR 50/CS (WOUND CARE) ×3 IMPLANT
ELECT COATED BLADE 2.86 ST (ELECTRODE) ×3 IMPLANT
ELECT REM PT RETURN 9FT ADLT (ELECTROSURGICAL) ×2
ELECTRODE REM PT RTRN 9FT ADLT (ELECTROSURGICAL) ×2 IMPLANT
GAUZE 4X4 16PLY ~~LOC~~+RFID DBL (SPONGE) IMPLANT
GAUZE SPONGE 4X4 12PLY STRL (GAUZE/BANDAGES/DRESSINGS) ×3 IMPLANT
GLOVE BIO SURGEON STRL SZ7 (GLOVE) IMPLANT
GLOVE BIO SURGEON STRL SZ8 (GLOVE) ×3 IMPLANT
GLOVE BIOGEL PI IND STRL 6.5 (GLOVE) IMPLANT
GLOVE BIOGEL PI IND STRL 7.0 (GLOVE) IMPLANT
GLOVE BIOGEL PI IND STRL 7.5 (GLOVE) IMPLANT
GLOVE BIOGEL PI INDICATOR 6.5 (GLOVE) ×2
GLOVE BIOGEL PI INDICATOR 7.0 (GLOVE)
GLOVE BIOGEL PI INDICATOR 7.5 (GLOVE) ×1
GLOVE EXAM NITRILE XL STR (GLOVE) IMPLANT
GLOVE INDICATOR 8.5 STRL (GLOVE) ×6 IMPLANT
GLOVE SURG SS PI 6.0 STRL IVOR (GLOVE) ×1 IMPLANT
GLOVE SURG SS PI 6.5 STRL IVOR (GLOVE) ×1 IMPLANT
GOWN STRL REUS W/ TWL LRG LVL3 (GOWN DISPOSABLE) ×2 IMPLANT
GOWN STRL REUS W/ TWL XL LVL3 (GOWN DISPOSABLE) ×2 IMPLANT
GOWN STRL REUS W/TWL 2XL LVL3 (GOWN DISPOSABLE) ×3 IMPLANT
GOWN STRL REUS W/TWL LRG LVL3 (GOWN DISPOSABLE) ×2
GOWN STRL REUS W/TWL XL LVL3 (GOWN DISPOSABLE) ×4
HALTER HD/CHIN CERV TRACTION D (MISCELLANEOUS) ×3 IMPLANT
HEMOSTAT POWDER KIT SURGIFOAM (HEMOSTASIS) ×3 IMPLANT
KIT BASIN OR (CUSTOM PROCEDURE TRAY) ×3 IMPLANT
KIT TURNOVER KIT B (KITS) ×3 IMPLANT
NDL HYPO 18GX1.5 BLUNT FILL (NEEDLE) ×2 IMPLANT
NDL SPNL 20GX3.5 QUINCKE YW (NEEDLE) ×2 IMPLANT
NEEDLE HYPO 18GX1.5 BLUNT FILL (NEEDLE) ×2 IMPLANT
NEEDLE SPNL 20GX3.5 QUINCKE YW (NEEDLE) ×2 IMPLANT
NS IRRIG 1000ML POUR BTL (IV SOLUTION) ×3 IMPLANT
OIL CARTRIDGE MAESTRO DRILL (MISCELLANEOUS) ×2
PACK LAMINECTOMY NEURO (CUSTOM PROCEDURE TRAY) ×3 IMPLANT
PAD ARMBOARD 7.5X6 YLW CONV (MISCELLANEOUS) ×9 IMPLANT
PIN DISTRACTION 14MM (PIN) ×2 IMPLANT
PLATE CERV RES ACP 14 1L (Plate) ×1 IMPLANT
SCREW VA SD RESONATE 4.2X14 (Screw) ×4 IMPLANT
SPONGE INTESTINAL PEANUT (DISPOSABLE) ×4 IMPLANT
SPONGE SURGIFOAM ABS GEL SZ50 (HEMOSTASIS) ×3 IMPLANT
STRIP CLOSURE SKIN 1/2X4 (GAUZE/BANDAGES/DRESSINGS) ×3 IMPLANT
SUT VIC AB 3-0 SH 8-18 (SUTURE) ×3 IMPLANT
SUT VICRYL 4-0 PS2 18IN ABS (SUTURE) ×3 IMPLANT
TAPE CLOTH 4X10 WHT NS (GAUZE/BANDAGES/DRESSINGS) ×3 IMPLANT
TOWEL GREEN STERILE (TOWEL DISPOSABLE) ×3 IMPLANT
TOWEL GREEN STERILE FF (TOWEL DISPOSABLE) ×3 IMPLANT
WATER STERILE IRR 1000ML POUR (IV SOLUTION) ×3 IMPLANT

## 2021-12-17 NOTE — Progress Notes (Signed)
PHARMACIST - PHYSICIAN ORDER COMMUNICATION  CONCERNING: P&T Medication Policy on Herbal Medications  DESCRIPTION:  This patient's order for:  Co Q 10  has been noted.  This product(s) is classified as an "herbal" or natural product. Due to a lack of definitive safety studies or FDA approval, nonstandard manufacturing practices, plus the potential risk of unknown drug-drug interactions while on inpatient medications, the Pharmacy and Therapeutics Committee does not permit the use of "herbal" or natural products of this type within [facility].   ACTION TAKEN: The pharmacy department is unable to verify this order at this time and your patient has been informed of this safety policy. Please reevaluate patient's clinical condition at discharge and address if the herbal or natural product(s) should be resumed at that time.   Yarel Rushlow A. Gracelynne Benedict, PharmD, BCPS, FNKF Clinical Pharmacist [place] Please utilize Amion for appropriate phone number to reach the unit pharmacist ([facility])  

## 2021-12-17 NOTE — Progress Notes (Signed)
Pt arrived to 4NP08 from PACU. Vitals WNL and documented. Pt alert and orientedx4, with no complaints of pain at this time. Full assessment documented.  Justice Rocher, RN

## 2021-12-17 NOTE — Anesthesia Postprocedure Evaluation (Signed)
Anesthesia Post Note  Patient: [name] [name].  Procedure(s) Performed: Anterior Cervical Decompression Fusion - Cervical three-Cervical four     Patient location during evaluation: PACU Anesthesia Type: General Level of consciousness: awake and sedated Pain management: pain level controlled Vital Signs Assessment: post-procedure vital signs reviewed and stable Respiratory status: spontaneous breathing Cardiovascular status: stable Postop Assessment: no apparent nausea or vomiting Anesthetic complications: no   No notable events documented.  Last Vitals:  Vitals:   12/17/21 1100 12/17/21 1130  BP: (!) 142/62 135/70  Pulse: (!) 56 (!) 53  Resp: 16 12  Temp:    SpO2: 97% 98%    Last Pain:  Vitals:   12/17/21 1015  TempSrc:   PainSc: 5                  John F Leyton Magoon Jr

## 2021-12-17 NOTE — TOC Progression Note (Signed)
Transition of Care (TOC) - Progression Note    Patient Details  Name: [name] [name]. MRN: [id number] Date of Birth: 11-23-1940  Transition of Care [facility]) CM/SW [place], RN Phone Number:[phone]  12/17/2021, 3:31 PM  Clinical Narrative:    [facility] acknowledges general consult for PT / OT / SLP / DME as needed. TOC will follow for any disposition needs        Expected Discharge Plan and Services                                                 Social Determinants of Health (SDOH) Interventions    Readmission Risk Interventions     No data to display

## 2021-12-17 NOTE — Transfer of Care (Signed)
Immediate Anesthesia Transfer of Care Note  Patient: [name] [name].  Procedure(s) Performed: Anterior Cervical Decompression Fusion - Cervical three-Cervical four  Patient Location: PACU  Anesthesia Type:General  Level of Consciousness: drowsy and patient cooperative  Airway & Oxygen Therapy: Patient Spontanous Breathing  Post-op Assessment: Report given to RN and Post -op Vital signs reviewed and stable  Post vital signs: Reviewed and stable  Last Vitals:  Vitals Value Taken Time  BP 143/86 12/17/21 0951  Temp    Pulse 59 12/17/21 0955  Resp 15 12/17/21 0955  SpO2 98 % 12/17/21 0955  Vitals shown include unvalidated device data.  Last Pain:  Vitals:   12/17/21 0633  TempSrc:   PainSc: 0-No pain         Complications: No notable events documented.

## 2021-12-17 NOTE — Op Note (Signed)
Preoperative diagnosis: Cervical spondylitic myelopathy from spinal cord compression and large disc herniation C3-4  Postoperative diagnosis: Same  Procedure: Anterior cervical discectomy and fusion C3-4 utilizing allograft structural spacer and the globus resonate plating system.  Surgeon: Kary Kos.  Anesthesia General.  EBL minimal.  HPI: 81 year old gentleman progressive worsening neck and bilateral shoulder and arm pain with numbness ting weakness in his hands and difficulty walking workup revealed cord compression from a large disc herniation at C3-4 and due to his progression of clinical syndrome imaging findings and failed conservative treatment I recommended anterior cervical discectomy and fusion at that level.  I extensively reviewed the risks and benefits of that operation with him as well as perioperative course expectations of outcome and alternatives of surgery and he understood and agreed to proceed forward.[name]  Operative procedure: Patient was brought into the OR was induced under general anesthesia positioned supine the neck in slight extension 5 pounds halter traction.  The right side of his neck was prepped and draped in routine sterile fashion.  Preoperative x-ray localized the appropriate level.  So a curvilinear incision was made just off the midline to the anterior border of the sternocleidomastoid and the superficial layer of platysma was dissected out divided longitudinally.  The avascular plane between the sternocleidomastoid and strap muscles was developed down to the prevertebral fascia and prevertebral fascia was dissected away with Kitners.  Intraoperative x-ray confirmed initially the C4-5 disc base level so attention was taken 1 interspace above this and the longus goes reflected laterally and self-retaining retractor was placed.  Disc base was incised anterior osteophytes were bitten off with a 3 Miller Kerrison punch disc base was drilled down to the posterior annulus  and osteophytic complex utilizing a nerve hook I fished out several large fragments that have migrated subligamentous identified the dura aggressively under Bitton both endplates and teased out significant more disc fragments decompressing the central canal and with the under biting of both endplates marching laterally to the level of the both C4 pedicles I decompress the spinal cord thecal sac and both C4 nerve roots.  At after adequate decompression been achieved in the thecal sac and resumed its normal anatomic position the wound was copiously irrigated with 6 hemostasis was maintained I selected an 8 mm lordotic structural allograft and inserted it 2 mm deep to the anterior vertebral line and then I selected a 14 mm globus resonate plate all screws then were placed all screws had excellent purchase locking mechanisms were engaged.  The wound was then copiously irrigated meticulous hemostasis was maintained and the wound was closed in layers with interrupted Vicryl in the platysma and a running 4 subcuticular.  Dermabond benzoin Steri-Strips and a sterile dressing was applied patient recovery in stable condition.  At the end the case all needle counts and sponge counts were correct.

## 2021-12-17 NOTE — Evaluation (Signed)
Occupational Therapy Evaluation Patient Details Name: [name] [name]. MRN: [id number] DOB: 07-24-1940 Today's Date: 12/17/2021   History of Present Illness 81 yo M s/p ACDF.  PMH includes: CAD, CABG, arthritis, HTN.   Clinical Impression   Patient admitted for the procedure above.  PTA he lives alone, admits to unsteadiness, but continues to be Ind with ADL, iADL, and mobility.  Mild soreness to neck and decreased balance are the primary deficits.  In room mobility with HHA.  Currently he is needing Min Guard to Sullivan A for toileting and lower body ADL, but he should return to baseline quickly.  OT will follow, recommend PT Consult, and no HH OT is anticipated.  His BIL will be staying with him for a few days after discharge.        Recommendations for follow up therapy are one component of a multi-disciplinary discharge planning process, led by the attending physician.  Recommendations may be updated based on patient status, additional functional criteria and insurance authorization.   Follow Up Recommendations  No OT follow up    Assistance Recommended at Discharge Intermittent Supervision/Assistance  Patient can return home with the following Assist for transportation;Assistance with cooking/housework    Functional Status Assessment  Patient has had a recent decline in their functional status and demonstrates the ability to make significant improvements in function in a reasonable and predictable amount of time.  Equipment Recommendations  None recommended by OT    Recommendations for Other Services       Precautions / Restrictions Precautions Precautions: Cervical Precaution Booklet Issued: Yes (comment) Required Braces or Orthoses: Cervical Brace Cervical Brace: Soft collar;For comfort Restrictions Weight Bearing Restrictions: No      Mobility Bed Mobility Overal bed mobility: Needs Assistance Bed Mobility: Sidelying to Sit   Sidelying to sit: Supervision             Transfers Overall transfer level: Needs assistance   Transfers: Sit to/from Stand, Bed to chair/wheelchair/BSC Sit to Stand: Min guard     Step pivot transfers: Min guard, Min assist     General transfer comment: reaching for objects in his environment.      Balance Overall balance assessment: Needs assistance Sitting-balance support: Feet supported Sitting balance-Leahy Scale: Normal     Standing balance support: Single extremity supported Standing balance-Leahy Scale: Fair                             ADL either performed or assessed with clinical judgement   ADL       Grooming: Min guard;Standing           Upper Body Dressing : Set up;Sitting   Lower Body Dressing: Min guard;Sit to/from stand   Toilet Transfer: Min Dentist;Ambulation                   Vision Baseline Vision/History: 1 Wears glasses Patient Visual Report: No change from baseline       Perception Perception Perception: Not tested   Praxis Praxis Praxis: Not tested    Pertinent Vitals/Pain Pain Assessment Pain Assessment: Faces Faces Pain Scale: Hurts a little bit Pain Location: neck Pain Descriptors / Indicators: Sore Pain Intervention(s): Monitored during session     Hand Dominance Right   Extremity/Trunk Assessment Upper Extremity Assessment Upper Extremity Assessment: Overall WFL for tasks assessed   Lower Extremity Assessment Lower Extremity Assessment: Defer to PT evaluation   Cervical /  Trunk Assessment Cervical / Trunk Assessment: Neck Surgery   Communication Communication Communication: No difficulties   Cognition Arousal/Alertness: Awake/alert Behavior During Therapy: WFL for tasks assessed/performed Overall Cognitive Status: Within Functional Limits for tasks assessed                                       General Comments   VSS on RA    Exercises     Shoulder Instructions       Home Living Family/patient expects to be discharged to:: Private residence Living Arrangements: Alone Available Help at Discharge: Family;Available PRN/intermittently Type of Home: House Home Access: Stairs to enter [employer] of Steps: 4 Entrance Stairs-Rails: Right Home Layout: One level     Bathroom Shower/Tub: Tub/shower unit;Walk-in shower   Bathroom Toilet: Handicapped height Bathroom Accessibility: Yes How Accessible: Accessible via walker Home Equipment: Advice worker (2 wheels);Cane - single point;BSC/3in1   Additional Comments: most DME was his spouses.  His BIL will be staying with him for a few days.      Prior Functioning/Environment Prior Level of Function : Independent/Modified Independent;Driving             Mobility Comments: Admits to unsteadiness, but walks w/o AD ADLs Comments: Ind with ADL, iADL, drives, continues to do his own yardwork.        OT Problem List: Impaired balance (sitting and/or standing)      OT Treatment/Interventions: Self-care/ADL training;Therapeutic activities;Balance training    OT Goals(Current goals can be found in the care plan section) Acute Rehab OT Goals Patient Stated Goal: Possibly go home tomorrow OT Goal Formulation: With patient Time For Goal Achievement: 12/31/21 Potential to Achieve Goals: Good ADL Goals Pt Will Perform Lower Body Dressing: with modified independence;sit to/from stand Pt Will Transfer to Toilet: with modified independence;ambulating;regular height toilet  OT Frequency: Min 2X/week    Co-evaluation              AM-PAC OT "6 Clicks" Daily Activity     Outcome Measure Help from another person eating meals?: None Help from another person taking care of personal grooming?: A Little Help from another person toileting, which includes using toliet, bedpan, or urinal?: A Little Help from another person bathing (including washing, rinsing, drying)?: A Little Help  from another person to put on and taking off regular upper body clothing?: None Help from another person to put on and taking off regular lower body clothing?: A Little 6 Click Score: 20   End of Session Equipment Utilized During Treatment: Cervical collar Nurse Communication: Mobility status  Activity Tolerance: Patient tolerated treatment well Patient left: in chair;with call bell/phone within reach;with family/visitor present  OT Visit Diagnosis: Unsteadiness on feet (R26.81)                Time: 1435-1500 OT Time Calculation (min): 25 min Charges:  OT General Charges $OT Visit: 1 Visit OT Evaluation $OT Eval Moderate Complexity: 1 Mod OT Treatments $Self Care/Home Management : 8-22 mins  12/17/2021  RP, OTR/L  Acute Rehabilitation Services  Office:  [phone]   Metta Clines 12/17/2021, 3:10 PM

## 2021-12-17 NOTE — H&P (Signed)
[name] Age. is an 81 y.o. male.   Chief Complaint: Numbness in his hands weakness in his hands difficulty walking HPI: 81 year old gentleman is a progressive worsening numbness weakness in his hands difficulty walking.  Clinical exam is consistent with a cervical myelopathy and imaging showed severe cord compression with signal change within his cord at C3-4.  Due to patient's progression of clinical syndrome imaging findings of a conservative treatment I recommended anterior cervical discectomy and fusion at that level.  I extensively reviewed the risks and benefits of the operation with the patient as well as perioperative course expectations of outcome and alternatives of surgery and he understood and agreed to proceed forward.  Past Medical History:  Diagnosis Date   Arthritis    CAD in native artery 05/09/1988   Status post PCI to the RCA --> CABG in 2002:   Dyslipidemia, goal LDL below 70     on statin   Hematuria    OCASIONAL HEMATURIA UNK CAUSE   History of kidney stones    Hypertension    Hypothyroidism    OSA (obstructive sleep apnea)    AHI-18.3/hr, AHI REM-3.1/hr UNABLE TO ADJUST TO USING C-PAP   PONV (postoperative nausea and vomiting)    S/P CABG x 3 05/09/2000   LIMA-LAD, SVG-PDA, SVG-OM.    Past Surgical History:  Procedure Laterality Date   CARDIAC CATHETERIZATION  03/17/2010   Patent LIMA-LAD, small non-intervenable D1 with ostial 70% stenosis; patent SVG-OM with retrograde flow to the native circumflex & 90% stenosis in main Cx. 60-70% lesion in AVG Cx. Patent SVG-RPDA with retrograde flow filling the PL system.   CARDIAC CATHETERIZATION  03/28/2000   Recommended CABG --> CABG in Jan 2002   CARDIAC CATHETERIZATION  10/21/1988   Severe disease in Mid and Distal RCA. Mid portion-90%, distal portion-80%   CHOLECYSTECTOMY     1997   CORONARY ANGIOPLASTY  12/02/1988   Proximal RCA 95% stenosis -- PTCA inflated with a 2.54m balloon --> 3.0 mm Piccolino balloon  resulting in a 20-30% irregularity; Distal RCA 90% stenosis --> 2.5 balloon results were normal   CORONARY ANGIOPLASTY  08/04/1988   Distal RCA 95% occluded -> PTCA w/ 2.5 mm balloon -->.  30% irregularity   CORONARY ARTERY BYPASS GRAFT  Jan 2002   3 VESSELS 2002; LIMA-LAD, SVG-OM, SVG to RPDA.   JOINT REPLACEMENT     NM MYOVIEW LTD  September 2011   mild ischemia in basal inerolateral, mid inferolateral, and apical regions, post-stress EF 61%, Low-Risk Scan;   TOTAL KNEE ARTHROPLASTY  03/21/2011   Procedure: TOTAL KNEE ARTHROPLASTY;  Surgeon: FGearlean Alf  Location: [facility];  Service: Orthopedics;  Laterality: Left;   TOTAL KNEE ARTHROPLASTY  06/01/2012   Procedure: TOTAL KNEE ARTHROPLASTY;  Surgeon: FGearlean Alf MD;  Location: [facility];  Service: Orthopedics;  Laterality: Right;    Family History  Problem Relation Age of Onset   Heart disease Mother    Heart disease Father    Cancer Sister    Social History:  reports that he quit smoking about 50 years ago. His smoking use included cigarettes. He has never used smokeless tobacco. He reports that he does not drink alcohol and does not use drugs.  Allergies:  Allergies  Allergen Reactions   Contrast Media [Iodinated Contrast Media] Other (See Comments)    Unknown    Crestor [Rosuvastatin]     SEVERE LEG CRAMPS   Sulfa Antibiotics Swelling    REACTION WAS  YRS AGO - SWELLING, SKIN REDNESS   Bactrim [Sulfamethoxazole-Trimethoprim] Rash   Doxycycline Rash    Medications Prior to Admission  Medication Sig Dispense Refill   aspirin 81 MG tablet Take 81 mg by mouth daily.     atorvastatin (LIPITOR) 20 MG tablet TAKE 1 TABLET EVERY DAY AT 6 P.M. (Patient taking differently: Take 10 mg by mouth daily.) 90 tablet 3   Coenzyme Q10 (CO Q-10) 300 MG CAPS Take 300 mg by mouth daily.     enalapril (VASOTEC) 20 MG tablet Take 1 tablet (20 mg total) by mouth at bedtime. 90 tablet 3   levothyroxine (SYNTHROID, LEVOTHROID) 125 MCG tablet  Take 125 mcg by mouth daily before breakfast.     metoprolol succinate (TOPROL-XL) 25 MG 24 hr tablet TAKE 1 TABLET EVERY DAY (NEED MD APPOINTMENT) 90 tablet 3   NON FORMULARY CPAP MACHINE     Omega-3 Fatty Acids (FISH OIL PO) Take 1 capsule by mouth daily.     polyvinyl alcohol (LIQUIFILM TEARS) 1.4 % ophthalmic solution Place 1 drop into both eyes as needed for dry eyes.     tamsulosin (FLOMAX) 0.4 MG CAPS capsule Take 0.4 mg by mouth daily.     COVID-19 mRNA bivalent vaccine, Pfizer, (PFIZER COVID-19 VAC BIVALENT) injection Inject into the muscle. (Patient not taking: Reported on 12/08/2021) 0.3 mL 0    No results found for this or any previous visit (from the past 48 hour(s)). No results found.  Review of Systems  Musculoskeletal:  Positive for gait problem.  Neurological:  Positive for weakness and numbness.    Blood pressure (!) 140/81, pulse 78, temperature 97.9 F (36.6 C), temperature source Oral, resp. rate 18, height '5\' 7"'$  (1.702 m), weight 63.5 kg, SpO2 99 %. Physical Exam HENT:     Head: Normocephalic.     Right Ear: Tympanic membrane normal.     Nose: Nose normal.     Mouth/Throat:     Mouth: Mucous membranes are moist.  Eyes:     Pupils: Pupils are equal, round, and reactive to light.  Cardiovascular:     Rate and Rhythm: Normal rate.  Pulmonary:     Effort: Pulmonary effort is normal.  Abdominal:     General: Abdomen is flat.  Musculoskeletal:        General: Normal range of motion.  Neurological:     Mental Status: He is alert.     Comments: Patient awake and alert strength has some weakness in grips bilaterally and 4 out of 5 triceps 4 to 4+ out of 5.  Otherwise 5 out of 5      Assessment/Plan 81 year old presents for ACDF C3-4.  [name] Hoops, MD 12/17/2021, 7:26 AM

## 2021-12-18 DIAGNOSIS — R2681 Unsteadiness on feet: Secondary | ICD-10-CM | POA: Diagnosis not present

## 2021-12-18 DIAGNOSIS — Z951 Presence of aortocoronary bypass graft: Secondary | ICD-10-CM | POA: Diagnosis not present

## 2021-12-18 DIAGNOSIS — M5001 Cervical disc disorder with myelopathy,  high cervical region: Secondary | ICD-10-CM | POA: Diagnosis not present

## 2021-12-18 DIAGNOSIS — Z79899 Other long term (current) drug therapy: Secondary | ICD-10-CM | POA: Diagnosis not present

## 2021-12-18 DIAGNOSIS — Z87891 Personal history of nicotine dependence: Secondary | ICD-10-CM | POA: Diagnosis not present

## 2021-12-18 DIAGNOSIS — I2510 Atherosclerotic heart disease of native coronary artery without angina pectoris: Secondary | ICD-10-CM | POA: Diagnosis not present

## 2021-12-18 DIAGNOSIS — M4712 Other spondylosis with myelopathy, cervical region: Secondary | ICD-10-CM | POA: Diagnosis not present

## 2021-12-18 DIAGNOSIS — I10 Essential (primary) hypertension: Secondary | ICD-10-CM | POA: Diagnosis not present

## 2021-12-18 DIAGNOSIS — E039 Hypothyroidism, unspecified: Secondary | ICD-10-CM | POA: Diagnosis not present

## 2021-12-18 MED ORDER — HYDROCODONE-ACETAMINOPHEN 5-325 MG PO TABS: 2.0000 | ORAL_TABLET | ORAL | 0 refills | Status: DC | PRN

## 2021-12-18 NOTE — Evaluation (Signed)
Physical Therapy Evaluation Patient Details Name: [name] [name]. MRN: [id number] DOB: [birth year] Today's Date: 12/18/2021  History of Present Illness  81 yo M s/p ACDF C3-4 on 12/17/21.  PMH includes: CAD, CABG, arthritis, HTN.  Clinical Impression  Pt presents with condition above and deficits mentioned below, see PT Problem List. PTA, he was living alone and IND without using an AD. He demonstrates deficits in bil lower extremity sensation (L worse than R), L anterior tibialis strength, balance, and activity tolerance. He is at risk for falls and benefits from using a RW to reduce this risk. He is able to perform all functional mobility with UE support without physical assistance though. Provided education to pt and his brother-in-law (who plans to stay with him to assist him for the next 5 days or so) on car transfers, waiting for MD to clear pt to drive, adjusting DME to pt's height, use of RW to reduce falls risk, guarding on stairs, and cervical precautions. They verbalized understanding. Will continue to follow acutely. Recommending HHPT follow-up.      Recommendations for follow up therapy are one component of a multi-disciplinary discharge planning process, led by the attending physician.  Recommendations may be updated based on patient status, additional functional criteria and insurance authorization.  Follow Up Recommendations Home health PT      Assistance Recommended at Discharge PRN  Patient can return home with the following  Assistance with cooking/housework;Assist for transportation;Help with stairs or ramp for entrance    Equipment Recommendations None recommended by PT  Recommendations for Other Services       Functional Status Assessment Patient has had a recent decline in their functional status and demonstrates the ability to make significant improvements in function in a reasonable and predictable amount of time.     Precautions / Restrictions  Precautions Precautions: Cervical Precaution Booklet Issued: Yes (comment) Precaution Comments: reviewed Required Braces or Orthoses: Cervical Brace Cervical Brace: Soft collar;For comfort Restrictions Weight Bearing Restrictions: No      Mobility  Bed Mobility               General bed mobility comments: Pt sitting up on EOB upon arrival    Transfers Overall transfer level: Needs assistance Equipment used: Rolling walker (2 wheels) Transfers: Sit to/from Stand Sit to Stand: Min guard           General transfer comment: Min guard for safety with multiple transfers from EOB. Cues provided to push up from surface to stand and reach back for surface for controlled sit, good carryover noted.    Ambulation/Gait Ambulation/Gait assistance: Min guard, Supervision Gait Distance (Feet): 280 Feet Assistive device: Rolling walker (2 wheels) Gait Pattern/deviations: Step-through pattern, Decreased stride length, Decreased dorsiflexion - left, Trunk flexed Gait velocity: reduced Gait velocity interpretation: <1.8 ft/sec, indicate of risk for recurrent falls   General Gait Details: Pt with slightly flexed posture and noted decreased L dorsiflexion with occasional foot slap. No LOB but unsteadiness noted, min guard-supervision for safety. Needs reminders to remain within RW  Stairs Stairs: Yes Stairs assistance: Min guard Stair Management: One rail Left, One rail Right, Step to pattern, Forwards Number of Stairs: 3 General stair comments: Ascends with L rail and descends with R rail to simulate home. Leads up with R foot and down with L as cued for improved ease and safety, no LOB, min guard for safety.  Wheelchair Mobility    Modified Rankin (Stroke Patients Only)  Balance Overall balance assessment: Needs assistance Sitting-balance support: Feet supported Sitting balance-Leahy Scale: Normal Sitting balance - Comments: able to reach off BOS to dress self    Standing balance support: Single extremity supported, Bilateral upper extremity supported, During functional activity Standing balance-Leahy Scale: Poor Standing balance comment: Reliant on UE support                             Pertinent Vitals/Pain Pain Assessment Pain Assessment: Faces Faces Pain Scale: Hurts a little bit Pain Location: neck Pain Descriptors / Indicators: Sore, Operative site guarding Pain Intervention(s): Limited activity within patient's tolerance, Monitored during session, Repositioned    Home Living Family/patient expects to be discharged to:: Private residence Living Arrangements: Alone Available Help at Discharge: Family;Available PRN/intermittently Type of Home: House Home Access: Stairs to enter Entrance Stairs-Rails: Right Entrance Stairs-Number of Steps: 4   Home Layout: One level Home Equipment: Advice worker (2 wheels);Cane - single point;BSC/3in1 Additional Comments: most DME was his spouses.  His BIL will be staying with him for a few days.    Prior Function Prior Level of Function : Independent/Modified Independent;Driving             Mobility Comments: Admits to unsteadiness, but walks w/o AD ADLs Comments: Ind with ADL, iADL, drives, continues to do his own yardwork.     Hand Dominance   Dominant Hand: Right    Extremity/Trunk Assessment   Upper Extremity Assessment Upper Extremity Assessment: Defer to OT evaluation    Lower Extremity Assessment Lower Extremity Assessment: LLE deficits/detail;RLE deficits/detail RLE Deficits / Details: slight tingling/numbness throughout, but worse on L; MMT scores of 4+ to 5 grossly RLE Sensation: decreased light touch LLE Deficits / Details: tingling/numbness throughout, worse on L than on R; MMT scores of 4+ to 5 grossly except ankle dorsiflexion was 4/5 LLE Sensation: decreased light touch    Cervical / Trunk Assessment Cervical / Trunk Assessment: Neck Surgery   Communication   Communication: No difficulties  Cognition Arousal/Alertness: Awake/alert Behavior During Therapy: WFL for tasks assessed/performed Overall Cognitive Status: Within Functional Limits for tasks assessed                                 General Comments: Slow processing when multi-tasking, but likely baseline        General Comments General comments (skin integrity, edema, etc.): educated pt and his BIL on car transfers, waiting for MD to clear pt to drive, adjusting DME to pt's height, use of RW to reduce falls risk, guarding on stairs, cervical precautions    Exercises     Assessment/Plan    PT Assessment Patient needs continued PT services  PT Problem List Decreased strength;Decreased balance;Decreased mobility;Decreased activity tolerance;Decreased knowledge of use of DME;Decreased knowledge of precautions;Impaired sensation       PT Treatment Interventions DME instruction;Gait training;Stair training;Functional mobility training;Therapeutic activities;Therapeutic exercise;Neuromuscular re-education;Balance training;Patient/family education    PT Goals (Current goals can be found in the Care Plan section)  Acute Rehab PT Goals Patient Stated Goal: to go home today PT Goal Formulation: With patient/family Time For Goal Achievement: 12/25/21 Potential to Achieve Goals: Good    Frequency Min 5X/week     Co-evaluation               AM-PAC PT "6 Clicks" Mobility  Outcome Measure Help needed turning from your back to your  side while in a flat bed without using bedrails?: A Little Help needed moving from lying on your back to sitting on the side of a flat bed without using bedrails?: A Little Help needed moving to and from a bed to a chair (including a wheelchair)?: A Little Help needed standing up from a chair using your arms (e.g., wheelchair or bedside chair)?: A Little Help needed to walk in hospital room?: A Little Help needed climbing  3-5 steps with a railing? : A Little 6 Click Score: 18    End of Session Equipment Utilized During Treatment: Cervical collar Activity Tolerance: Patient tolerated treatment well Patient left: in chair;with call bell/phone within reach;with family/visitor present Nurse Communication: Mobility status PT Visit Diagnosis: Unsteadiness on feet (R26.81);Other abnormalities of gait and mobility (R26.89);Muscle weakness (generalized) (M62.81);Difficulty in walking, not elsewhere classified (R26.2);Other symptoms and signs involving the nervous system (G66.599)    Time: 3570-1779 PT Time Calculation (min) (ACUTE ONLY): 29 min   Charges:   PT Evaluation $PT Eval Moderate Complexity: 1 Mod PT Treatments $Gait Training: 8-22 mins        Moishe Spice, PT, DPT Acute Rehabilitation Services  Office: [place] 12/18/2021, 9:04 AM

## 2021-12-18 NOTE — TOC Transition Note (Signed)
Transition of Care [facility]) - CM/SW Discharge Note   Patient Details  Name: [name] [name]. MRN: [id number] Date of Birth: June 25, 1940  Transition of Care [facility]) CM/SW Contact:  Bartholomew Crews, RN Phone Number: [phone] 12/18/2021, 9:52 AM   Clinical Narrative:     Spoke with patient's granddaughter, Sherrine Maples, on hospital room phone. Patient consented to [facility] talking through his granddaughter. Discussed recommendations for [facility] PT. Offered agency choice - no preference except that cost be covered by insurance. Referral accepted by [facility]. No further TOC needs identified.    Final next level of care: [place] Barriers to Discharge: No Barriers Identified   Patient Goals and CMS Choice Patient states their goals for this hospitalization and ongoing recovery are:: return home CMS Medicare.gov Compare Post Acute Care list provided to:: Patient Choice offered to / list presented to : Patient  Discharge Placement                       Discharge Plan and Services                DME Arranged: N/A DME Agency: NA       HH Arranged: PT HH Agency: [place] Date [place]: 12/18/21 Time [place]: [phone] Representative spoke with at [place]: Otila Kluver  Social Determinants of Health (SDOH) Interventions     Readmission Risk Interventions     No data to display

## 2021-12-18 NOTE — Progress Notes (Signed)
Pt discharged home. Pt's granddaughter and brother-in-law present for discharge education. They have no questions at this time. PIV removed and all belongings sent home with pt.  Justice Rocher, RN

## 2021-12-18 NOTE — Discharge Summary (Signed)
Physician Discharge Summary  Patient ID: [name] [name] Age. MRN: [id number] DOB/AGE: 07-27-1940 81 y.o. Estimated body mass index is 21.93 kg/m as calculated from the following:   Height as of this encounter: '5\' 7"'$  (1.702 m).   Weight as of this encounter: 63.5 kg.   Admit date: 12/17/2021 Discharge date: 12/18/2021  Admission Diagnoses: Cervical spondylitic myelopathy  Discharge Diagnoses: Same Principal Problem:   Cervical myelopathy ([place]) Active Problems:   HNP (herniated nucleus pulposus) with myelopathy, cervical   Discharged Condition: good  Hospital Course: Patient was admitted to the hospital underwent anterior cervical discectomy and fusion at C3-4 postoperatively patient did very well with covering the floor on the floor was ambulating and voiding spontaneously tolerating regular diet and stable for discharge home.  Consults: Significant Diagnostic Studies: Treatments: ACDF C3-4 Discharge Exam: Blood pressure 130/86, pulse (!) 54, temperature 97.8 F (36.6 C), temperature source Oral, resp. rate 18, height '5\' 7"'$  (1.702 m), weight 63.5 kg, SpO2 99 %. Strength 5 out of 5 wound clean dry and intact  Disposition: Home   Allergies as of 12/18/2021       Reactions   Contrast Media [iodinated Contrast Media] Other (See Comments)   Unknown    Crestor [rosuvastatin]    SEVERE LEG CRAMPS   Sulfa Antibiotics Swelling   REACTION WAS YRS AGO - SWELLING, SKIN REDNESS   Bactrim [sulfamethoxazole-trimethoprim] Rash   Doxycycline Rash        Medication List     TAKE these medications    aspirin 81 MG tablet Take 81 mg by mouth daily.   atorvastatin 20 MG tablet Commonly known as: LIPITOR TAKE 1 TABLET EVERY DAY AT 6 P.M. What changed: See the new instructions.   Co Q-10 300 MG Caps Take 300 mg by mouth daily.   enalapril 20 MG tablet Commonly known as: VASOTEC Take 1 tablet (20 mg total) by mouth at bedtime.   FISH OIL PO Take 1 capsule by mouth daily.    HYDROcodone-acetaminophen 5-325 MG tablet Commonly known as: NORCO/VICODIN Take 2 tablets by mouth every 4 (four) hours as needed for severe pain ((score 7 to 10)).   levothyroxine 125 MCG tablet Commonly known as: SYNTHROID Take 125 mcg by mouth daily before breakfast.   metoprolol succinate 25 MG 24 hr tablet Commonly known as: TOPROL-XL TAKE 1 TABLET EVERY DAY (NEED MD APPOINTMENT)   NON FORMULARY CPAP MACHINE   Pfizer COVID-19 Vac Bivalent injection Generic drug: COVID-19 mRNA bivalent vaccine (Pfizer) Inject into the muscle.   polyvinyl alcohol 1.4 % ophthalmic solution Commonly known as: LIQUIFILM TEARS Place 1 drop into both eyes as needed for dry eyes.   tamsulosin 0.4 MG Caps capsule Commonly known as: FLOMAX Take 0.4 mg by mouth daily.         Signed: Elaina Hoops 12/18/2021, 8:30 AM

## 2021-12-20 ENCOUNTER — Encounter (HOSPITAL_COMMUNITY): Payer: Self-pay | Admitting: Neurosurgery

## 2021-12-21 MED FILL — Thrombin For Soln 5000 Unit: CUTANEOUS | Qty: 2 | Status: AC

## 2021-12-29 ENCOUNTER — Other Ambulatory Visit: Payer: Self-pay | Admitting: Cardiovascular Disease

## 2022-02-03 DIAGNOSIS — G959 Disease of spinal cord, unspecified: Secondary | ICD-10-CM | POA: Diagnosis not present

## 2022-02-10 DIAGNOSIS — N200 Calculus of kidney: Secondary | ICD-10-CM | POA: Diagnosis not present

## 2022-02-15 DIAGNOSIS — Z23 Encounter for immunization: Secondary | ICD-10-CM | POA: Diagnosis not present

## 2022-03-17 DIAGNOSIS — G959 Disease of spinal cord, unspecified: Secondary | ICD-10-CM | POA: Diagnosis not present

## 2022-03-29 DIAGNOSIS — G4733 Obstructive sleep apnea (adult) (pediatric): Secondary | ICD-10-CM | POA: Diagnosis not present

## 2022-03-29 DIAGNOSIS — Z0000 Encounter for general adult medical examination without abnormal findings: Secondary | ICD-10-CM | POA: Diagnosis not present

## 2022-03-29 DIAGNOSIS — N182 Chronic kidney disease, stage 2 (mild): Secondary | ICD-10-CM | POA: Diagnosis not present

## 2022-03-29 DIAGNOSIS — R7301 Impaired fasting glucose: Secondary | ICD-10-CM | POA: Diagnosis not present

## 2022-03-29 DIAGNOSIS — E039 Hypothyroidism, unspecified: Secondary | ICD-10-CM | POA: Diagnosis not present

## 2022-03-29 DIAGNOSIS — E782 Mixed hyperlipidemia: Secondary | ICD-10-CM | POA: Diagnosis not present

## 2022-03-29 DIAGNOSIS — M1990 Unspecified osteoarthritis, unspecified site: Secondary | ICD-10-CM | POA: Diagnosis not present

## 2022-03-29 DIAGNOSIS — I2510 Atherosclerotic heart disease of native coronary artery without angina pectoris: Secondary | ICD-10-CM | POA: Diagnosis not present

## 2022-03-29 DIAGNOSIS — I119 Hypertensive heart disease without heart failure: Secondary | ICD-10-CM | POA: Diagnosis not present

## 2022-04-19 DIAGNOSIS — H524 Presbyopia: Secondary | ICD-10-CM | POA: Diagnosis not present

## 2022-04-19 DIAGNOSIS — H52223 Regular astigmatism, bilateral: Secondary | ICD-10-CM | POA: Diagnosis not present

## 2022-04-19 DIAGNOSIS — H5203 Hypermetropia, bilateral: Secondary | ICD-10-CM | POA: Diagnosis not present

## 2022-04-20 DIAGNOSIS — H5203 Hypermetropia, bilateral: Secondary | ICD-10-CM | POA: Diagnosis not present

## 2022-04-20 DIAGNOSIS — H52209 Unspecified astigmatism, unspecified eye: Secondary | ICD-10-CM | POA: Diagnosis not present

## 2022-04-20 DIAGNOSIS — H524 Presbyopia: Secondary | ICD-10-CM | POA: Diagnosis not present

## 2022-06-13 DIAGNOSIS — L570 Actinic keratosis: Secondary | ICD-10-CM | POA: Diagnosis not present

## 2022-06-13 DIAGNOSIS — Z85828 Personal history of other malignant neoplasm of skin: Secondary | ICD-10-CM | POA: Diagnosis not present

## 2022-06-13 DIAGNOSIS — L814 Other melanin hyperpigmentation: Secondary | ICD-10-CM | POA: Diagnosis not present

## 2022-06-13 DIAGNOSIS — L821 Other seborrheic keratosis: Secondary | ICD-10-CM | POA: Diagnosis not present

## 2022-06-20 ENCOUNTER — Encounter: Payer: Self-pay | Admitting: Cardiology

## 2022-06-20 ENCOUNTER — Ambulatory Visit: Payer: Medicare HMO | Attending: Cardiology | Admitting: Cardiology

## 2022-06-20 VITALS — BP 146/78 | HR 58 | Ht 67.0 in | Wt 167.2 lb

## 2022-06-20 DIAGNOSIS — I2510 Atherosclerotic heart disease of native coronary artery without angina pectoris: Secondary | ICD-10-CM

## 2022-06-20 DIAGNOSIS — E785 Hyperlipidemia, unspecified: Secondary | ICD-10-CM | POA: Diagnosis not present

## 2022-06-20 DIAGNOSIS — I10 Essential (primary) hypertension: Secondary | ICD-10-CM | POA: Diagnosis not present

## 2022-06-20 DIAGNOSIS — R002 Palpitations: Secondary | ICD-10-CM

## 2022-06-20 DIAGNOSIS — G4733 Obstructive sleep apnea (adult) (pediatric): Secondary | ICD-10-CM

## 2022-06-20 NOTE — Patient Instructions (Addendum)
Medication Instructions:   No changes  *If you need a refill on your cardiac medications before your next appointment, please call your pharmacy*   Lab Work: Not needed    Testing/Procedures:  Not needed  Follow-Up: At [facility], you and your health needs are our priority.  As part of our continuing mission to provide you with exceptional heart care, we have created designated Provider Care Teams.  These Care Teams include your primary Cardiologist (physician) and Advanced Practice Providers (APPs -  Physician Assistants and Nurse Practitioners) who all work together to provide you with the care you need, when you need it.     Your next appointment:   6 month(s)  The format for your next appointment:   In Person  Provider:   Coletta Memos, FNP, [name], PA-C, Jory Sims, DNP, ANP, or Diona Browner, NP    Then, [name], MD will plan to see you again in 12 month(s).   Other Instructions    Recommendations for vagal maneuvers if you have fast heartrate  "Bearing down" Coughing Gagging Icy, cold towel on face or drink ice cold water    Avoid triggers Keep hydrate Do not use caffeine Try to avoid stress

## 2022-06-20 NOTE — Progress Notes (Signed)
Primary Care Provider: Mayra [name], [employer] Cardiologist: [name] Hew, MD Electrophysiologist: None  He returns at the request of [name] Neer, MD.  Clinic Note: Chief Complaint  Patient presents with   Follow-up    Annual follow-up delayed because of surgery.  Doing well.   Coronary Artery Disease    No angina or heart failure.   Tachycardia    Maybe 1 or 2 spells in the last 3 months-break with vagal maneuvers.   ===================================  ASSESSMENT/PLAN   Problem List Items Addressed This Visit       Cardiology Problems   Hyperlipidemia with target LDL less than 70 (Chronic)    Most recent labs showed LDL 73 which is better than it was in the past.  I would be reluctant to be overly aggressive, but may want to consider switching to rosuvastatin at same dose if his levels are not at goal on follow-up check.  Would like to see LDL less than 70 if not closer to 55.      Essential hypertension (Chronic)    Stable BP.  Borderline elevated today but in light of his history of orthostatic symptoms, and intermittent tachycardia episodes, would prefer to allow for mild permissive hypertension.  Target BP range for him should be systolic pressures in the 130s to 150s.      CAD in native artery -- status post CABG x3 (LIMA-LAD, SVG RPDA, SVG-OM) - Primary (Chronic)    Doing well with no active anginal symptoms.  No heart failure symptoms on current regimen. Based on previous discussions, we have decided to avoid further stress testing or other surveillance testing unless he has symptoms that warrant evaluation.  In that instance, would probably consider going straight to catheterization.  Plan: Continue aspirin, statin, beta-blocker and ACE inhibitor at current doses:  Atorvastatin 10 mg daily along with co-Q10 (although with LDL of 73, may want to consider increasing dose of levels and overall direction),  Enalapril 20 mg nightly and Toprol  25 mg daily. Okay to hold aspirin for surgeries or procedures.      Relevant Orders   EKG 12-Lead (Completed)     Other   Sleep apnea (Chronic)    Tolerating CPAP.  Stressed the importance of using as long as tolerated.      Palpitations; ? short runs of SVT/PAT (Chronic)    I think his episodes probably are consistent with short runs of atrial tachycardia or SVT.  They are relatively short-lived and tend to break with vagal maneuvers.  Continue to discuss avoiding triggers, maintaining adequate hydration.  Reviewed vagal maneuvers.  We will not build to titrate beta-blocker any further with resting heart rate of 58 bpm.  Continue to monitor.      Relevant Orders   EKG 12-Lead (Completed)   ===================================  HPI:    [name] [name]. is a 82 y.o. male with a PMH below who presents today for essentially delayed annual follow-up.  1990 => RCA PTCA -> 95% proximal and 90% distal RCA lesion treated with PTCA only. 2001 recurrent Angina --> CABGx3 RE-look Cath in Nov2011: Patent LIMA-LAD, small non-intervenable D1 ostial 70%; patent SVG-OM w/ retrograde flow to the native LCx& 90% stenosis in main Cx. 60-70% lesion in AVG Cx. Patent SVG-RPDA with retrograde flow filling the PL system.  [name] [name]. was last seen on March 30, 2021 -> he was started to get back into his normal self. Wife died in 2019-09-20 after  a long battle.  He was trying to keep things going but was himself somewhat lonely.  Had decreased his walking during COVID and avoiding gyms.  Was outside doing yard work, and taking the dogs for a walk..  No complaints of chest pain pressure dyspnea with rest exertion.  No heart failure symptoms of PND, orthopnea or edema.  May be a couple episodes every 2 to 3 months of fast heart rates lasting 5 to 10 minutes.  Usually resolves with vagal maneuvers and lying down.  Balance still little off but trying to hydrate.  He was most recently seen for preop  evaluation on July 31 via Telehealth by Margarite Gouge, NP.  Noted doing well with the exception of some mild dizziness with current dose of enalapril.  No further syncope or near syncopal symptoms.  Occasional episodes of fast heart rates lasting a for 5 minutes that are relieved with Valsalva or drinking ice water.  Otherwise no cardiac complaints no chest pain pressure dyspnea with rest or exertion.  Occasional isolated low blood pressures but usually in the 120s. => Felt to be low risk based on the RCRI-0.9%). Exercise capacity ~ 6 METS  => "Cleared for Surgery"  Recent Hospitalizations:  12/17/2021 - Anterior Cervical Decompression. (Dr. Saintclair Halsted)  Reviewed  CV studies:    The following studies were reviewed today: (if available, images/films reviewed: From Epic Chart or Care Everywhere) N/A:  Interval History:   [name] [name]. returns here today overall doing fairly well.  Feeling much better after his neck surgery.  He said he said 1 episode in the last 3 months where he felt his heart rate go up really fast.  It lasted less than 2 minutes-cleared with vagal maneuvers.  Otherwise, his heart rate is usually in the 60s.  Thankfully, with the faster rate spells he denied any chest pain or pressure.  No PND or orthopnea.  Trivial edema.  Occasional leg cramping after being seated for long period of time. He uses CPAP. Still trying to stay active walking the dogs and doing yard work.  Has not had any further dizzy spells, other than what he had tachycardia.  BP has been stable.  CV Review of Symptoms (Summary):  no chest pain or dyspnea on exertion positive for - palpitations, rapid heart rate, and associated dizziness.  Some cramping. negative for - edema, orthopnea, paroxysmal nocturnal dyspnea, shortness of breath, or syncope or near syncope, TIA/amaurosis fugax or claudication.  REVIEWED OF SYSTEMS   Review of Systems  Constitutional:  Negative for malaise/fatigue (Does not exercise or do  as much activity as he used to, mild exercise intolerance but stable.) and weight loss.  HENT:  Negative for nosebleeds.   Respiratory:  Negative for cough and shortness of breath.   Gastrointestinal:  Negative for blood in stool and melena.  Genitourinary:  Negative for hematuria (No recurrent hematuria.  (At least visible)).  Musculoskeletal:  Positive for joint pain (Mild aches and pains). Negative for falls and myalgias.  Neurological:  Negative for dizziness (Only with rare occasions of tachycardia), seizures and loss of consciousness.  Psychiatric/Behavioral: Negative.  Negative for depression and memory loss. The patient is not nervous/anxious and does not have insomnia.     I have reviewed and (if needed) personally updated the patient's problem list, medications, allergies, past medical and surgical history, social and family history.   PAST MEDICAL HISTORY   Past Medical History:  Diagnosis Date   Arthritis  CAD in native artery 05/09/1988   Status post PCI to the RCA --> CABG in 2002:   Dyslipidemia, goal LDL below 70     on statin   Hematuria    OCASIONAL HEMATURIA UNK CAUSE   History of kidney stones    Hypertension    Hypothyroidism    OSA (obstructive sleep apnea)    AHI-18.3/hr, AHI REM-3.1/hr UNABLE TO ADJUST TO USING C-PAP   PONV (postoperative nausea and vomiting)    S/P CABG x 3 05/09/2000   LIMA-LAD, SVG-PDA, SVG-OM.    PAST SURGICAL HISTORY   Past Surgical History:  Procedure Laterality Date   ANTERIOR CERVICAL DECOMP/DISCECTOMY FUSION N/A 12/17/2021   Procedure: Anterior Cervical Decompression Fusion - Cervical three-Cervical four;  Surgeon: Kary Kos, MD;  Location: [facility];  Service: Neurosurgery;  Laterality: N/A;   CARDIAC CATHETERIZATION  03/17/2010   Patent LIMA-LAD, small non-intervenable D1 with ostial 70% stenosis; patent SVG-OM with retrograde flow to the native circumflex & 90% stenosis in main Cx. 60-70% lesion in AVG Cx. Patent SVG-RPDA with  retrograde flow filling the PL system.   CARDIAC CATHETERIZATION  03/28/2000   Recommended CABG --> CABG in Jan 2002   CARDIAC CATHETERIZATION  10/21/1988   Severe disease in Mid and Distal RCA. Mid portion-90%, distal portion-80%   CHOLECYSTECTOMY     1997   CORONARY ANGIOPLASTY  12/02/1988   Proximal RCA 95% stenosis -- PTCA inflated with a 2.44m balloon --> 3.0 mm Piccolino balloon resulting in a 20-30% irregularity; Distal RCA 90% stenosis --> 2.5 balloon results were normal   CORONARY ANGIOPLASTY  08/04/1988   Distal RCA 95% occluded -> PTCA w/ 2.5 mm balloon -->.  30% irregularity   CORONARY ARTERY BYPASS GRAFT  Jan 2002   3 VESSELS 2002; LIMA-LAD, SVG-OM, SVG to RPDA.   JOINT REPLACEMENT     NM MYOVIEW LTD  September 2011   mild ischemia in basal inerolateral, mid inferolateral, and apical regions, post-stress EF 61%, Low-Risk Scan;   TOTAL KNEE ARTHROPLASTY  03/21/2011   Procedure: TOTAL KNEE ARTHROPLASTY;  Surgeon: FGearlean Alf  Location: [facility];  Service: Orthopedics;  Laterality: Left;   TOTAL KNEE ARTHROPLASTY  06/01/2012   Procedure: TOTAL KNEE ARTHROPLASTY;  Surgeon: FGearlean Alf MD;  Location: [facility];  Service: Orthopedics;  Laterality: Right;   MEDICATIONS/ALLERGIES   Current Meds  Medication Sig   aspirin 81 MG tablet Take 81 mg by mouth daily.   atorvastatin (LIPITOR) 20 MG tablet TAKE 1 TABLET EVERY DAY AT 6 P.M. (Patient taking differently: 10 mg by mouth daily.)   Coenzyme Q10 (CO Q-10) 300 MG CAPS Take 300 mg by mouth daily.   enalapril (VASOTEC) 20 MG tablet Take 1 tablet (20 mg total) by mouth at bedtime.   HYDROcodone-acetaminophen (NORCO/VICODIN) 5-325 MG tablet Take 2 tablets by mouth every 4 (four) hours as needed for severe pain ((score 7 to 10)).   levothyroxine (SYNTHROID, LEVOTHROID) 125 MCG tablet Take 125 mcg by mouth daily before breakfast.   metoprolol succinate (TOPROL-XL) 25 MG 24 hr tablet TAKE 1 TABLET EVERY DAY (NEED MD APPOINTMENT)   NON  FORMULARY CPAP MACHINE   Omega-3 Fatty Acids (FISH OIL PO) Take 1 capsule by mouth daily.   tamsulosin (FLOMAX) 0.4 MG CAPS capsule Take 0.4 mg by mouth daily.    Allergies  Allergen Reactions   Contrast Media [Iodinated Contrast Media] Other (See Comments)    Unknown    Crestor [Rosuvastatin]  SEVERE LEG CRAMPS   Sulfa Antibiotics Swelling    REACTION WAS YRS AGO - SWELLING, SKIN REDNESS   Bactrim [Sulfamethoxazole-Trimethoprim] Rash   Doxycycline Rash    SOCIAL HISTORY/FAMILY HISTORY   Reviewed in Epic:  Pertinent findings:  Social History   Tobacco Use   Smoking status: Former    Types: Cigarettes    Quit date: 03/14/1971    Years since quitting: 51.3   Smokeless tobacco: Never  Vaping Use   Vaping Use: Never used  Substance Use Topics   Alcohol use: No   Drug use: No   Social History   Social History Narrative   Widowed father of one, grandfather of 2. Now working out at the gym 3 days a week. Does not smoke and does not drink.      His wife died on Oct 31, 2019 after long handle on his arms.    OBJCTIVE -PE, EKG, labs   Wt Readings from Last 3 Encounters:  06/20/22 167 lb 3.2 oz (75.8 kg)  12/17/21 140 lb (63.5 kg)  12/13/21 156 lb 4.8 oz (70.9 kg)    Physical Exam: BP (!) 146/78   Pulse (!) 58   Ht 5' 7"$  (1.702 m)   Wt 167 lb 3.2 oz (75.8 kg)   SpO2 98%   BMI 26.19 kg/m  Physical Exam Vitals reviewed.  Constitutional:      General: He is not in acute distress.    Appearance: Normal appearance. He is normal weight. He is not ill-appearing (Well-nourished and well-groomed.  Appears well for stated [name].) or toxic-appearing.  HENT:     Head: Normocephalic and atraumatic.  Neck:     Vascular: No carotid bruit.  Cardiovascular:     Rate and Rhythm: Normal rate and regular rhythm.     Pulses: Normal pulses.     Heart sounds: Normal heart sounds. No murmur heard.    No friction rub. No gallop.  Pulmonary:     Effort: Pulmonary effort is  normal. No respiratory distress.     Breath sounds: Normal breath sounds. No wheezing, rhonchi or rales.  Chest:     Chest wall: No tenderness.  Musculoskeletal:        General: No swelling. Normal range of motion.     Cervical back: Normal range of motion and neck supple.  Skin:    General: Skin is warm and dry.  Neurological:     General: No focal deficit present.     Mental Status: He is alert and oriented to person, place, and time.     Gait: Gait normal.  Psychiatric:        Mood and Affect: Mood normal.        Behavior: Behavior normal.        Thought Content: Thought content normal.        Judgment: Judgment normal.     Adult ECG Report  Rate: 58;  Rhythm: sinus bradycardia; RBBB normal axis, intervals and durations.  Cannot exclude septal MI, [name]-indeterminate.  Narrative Interpretation: Stable  Recent Labs:   03/29/2022: TC 136, TG 56, HDL 50, LDL 73; A1c 6.0.Cr 0.99, K+ 4.8; TSH 0.98.    Latest Ref Rng & Units 12/13/2021   11:14 AM 06/03/2012    4:33 AM 06/02/2012    5:18 AM  CBC  WBC 4.0 - 10.5 K/uL 5.6  13.9  9.0   Hemoglobin 13.0 - 17.0 g/dL 12.5  9.9  9.9   Hematocrit 39.0 - 52.0 %  36.4  27.9  28.5   Platelets 150 - 400 K/uL 196  192  165     No results found for: "HGBA1C" No results found for: "TSH"  ================================================== I spent a total of 16 minutes with the patient spent in direct patient consultation.  Additional time spent with chart review  / charting (studies, outside notes, etc): 18 min Total Time: 34 min  Current medicines are reviewed at length with the patient today.  (+/- concerns) none  Notice: This dictation was prepared with Dragon dictation along with smart phrase technology. Any transcriptional errors that result from this process are unintentional and may not be corrected upon review.  Studies Ordered:   Orders Placed This Encounter  Procedures   EKG 12-Lead   No orders of the defined types were placed  in this encounter.   Patient Instructions / Medication Changes & Studies & Tests Ordered   Patient Instructions  Medication Instructions:   No changes  *If you need a refill on your cardiac medications before your next appointment, please call your pharmacy*   Lab Work: Not needed    Testing/Procedures:  Not needed  Follow-Up: At [facility], you and your health needs are our priority.  As part of our continuing mission to provide you with exceptional heart care, we have created designated Provider Care Teams.  These Care Teams include your primary Cardiologist (physician) and Advanced Practice Providers (APPs -  Physician Assistants and Nurse Practitioners) who all work together to provide you with the care you need, when you need it.     Your next appointment:   6 month(s)  The format for your next appointment:   In Person  Provider:   Coletta Memos, FNP, [name], PA-C, Jory Sims, DNP, ANP, or Diona Browner, NP    Then, [name] Hew, MD will plan to see you again in 12 month(s).   Other Instructions    Recommendations for vagal maneuvers if you have fast heartrate  "Bearing down" Coughing Gagging Icy, cold towel on face or drink ice cold water    Avoid triggers Keep hydrate Do not use caffeine Try to avoid stress     Leonie Man, MD, MS [name] [name], M.D., M.S. Interventional Cardiologist  [place]  Pager # [phone] Phone # [phone] [address] [place], [place] [address]   Thank you for choosing [facility]!!

## 2022-06-28 ENCOUNTER — Encounter: Payer: Self-pay | Admitting: Cardiology

## 2022-06-28 NOTE — Assessment & Plan Note (Signed)
Stable BP.  Borderline elevated today but in light of his history of orthostatic symptoms, and intermittent tachycardia episodes, would prefer to allow for mild permissive hypertension.  Target BP range for him should be systolic pressures in the 130s to 150s.

## 2022-06-28 NOTE — Assessment & Plan Note (Signed)
I think his episodes probably are consistent with short runs of atrial tachycardia or SVT.  They are relatively short-lived and tend to break with vagal maneuvers.  Continue to discuss avoiding triggers, maintaining adequate hydration.  Reviewed vagal maneuvers.  We will not build to titrate beta-blocker any further with resting heart rate of 58 bpm.  Continue to monitor.

## 2022-06-28 NOTE — Assessment & Plan Note (Signed)
Tolerating CPAP.  Stressed the importance of using as long as tolerated.

## 2022-06-28 NOTE — Assessment & Plan Note (Signed)
Most recent labs showed LDL 73 which is better than it was in the past.  I would be reluctant to be overly aggressive, but may want to consider switching to rosuvastatin at same dose if his levels are not at goal on follow-up check.  Would like to see LDL less than 70 if not closer to 55.

## 2022-06-28 NOTE — Assessment & Plan Note (Signed)
Doing well with no active anginal symptoms.  No heart failure symptoms on current regimen. Based on previous discussions, we have decided to avoid further stress testing or other surveillance testing unless he has symptoms that warrant evaluation.  In that instance, would probably consider going straight to catheterization.  Plan: Continue aspirin, statin, beta-blocker and ACE inhibitor at current doses:  Atorvastatin 10 mg daily along with co-Q10 (although with LDL of 73, may want to consider increasing dose of levels and overall direction),  Enalapril 20 mg nightly and Toprol 25 mg daily. Okay to hold aspirin for surgeries or procedures.

## 2022-08-03 DIAGNOSIS — I119 Hypertensive heart disease without heart failure: Secondary | ICD-10-CM | POA: Diagnosis not present

## 2022-08-03 DIAGNOSIS — G4733 Obstructive sleep apnea (adult) (pediatric): Secondary | ICD-10-CM | POA: Diagnosis not present

## 2022-09-19 ENCOUNTER — Other Ambulatory Visit: Payer: Self-pay | Admitting: Cardiology

## 2022-09-29 DIAGNOSIS — I2510 Atherosclerotic heart disease of native coronary artery without angina pectoris: Secondary | ICD-10-CM | POA: Diagnosis not present

## 2022-09-29 DIAGNOSIS — I119 Hypertensive heart disease without heart failure: Secondary | ICD-10-CM | POA: Diagnosis not present

## 2022-09-29 DIAGNOSIS — N182 Chronic kidney disease, stage 2 (mild): Secondary | ICD-10-CM | POA: Diagnosis not present

## 2022-09-29 DIAGNOSIS — E782 Mixed hyperlipidemia: Secondary | ICD-10-CM | POA: Diagnosis not present

## 2022-09-29 DIAGNOSIS — Z0000 Encounter for general adult medical examination without abnormal findings: Secondary | ICD-10-CM | POA: Diagnosis not present

## 2022-09-29 DIAGNOSIS — R7301 Impaired fasting glucose: Secondary | ICD-10-CM | POA: Diagnosis not present

## 2022-09-29 DIAGNOSIS — E039 Hypothyroidism, unspecified: Secondary | ICD-10-CM | POA: Diagnosis not present

## 2022-09-29 LAB — LAB REPORT - SCANNED
A1c: 5.8
EGFR: 86

## 2022-11-26 NOTE — Progress Notes (Signed)
Labs from [facility] 09/29/2022 Na+ 140, K+ 5.2, Cl- 107, HCO3-29, BUN 30, Cr 0.7, Glu 97, Ca2+ 9.7; AST 17, ALT 18, AlkP 50; A1c 5.8; TSH 1.02

## 2022-12-13 DIAGNOSIS — D225 Melanocytic nevi of trunk: Secondary | ICD-10-CM | POA: Diagnosis not present

## 2022-12-13 DIAGNOSIS — D2261 Melanocytic nevi of right upper limb, including shoulder: Secondary | ICD-10-CM | POA: Diagnosis not present

## 2022-12-13 DIAGNOSIS — D692 Other nonthrombocytopenic purpura: Secondary | ICD-10-CM | POA: Diagnosis not present

## 2022-12-13 DIAGNOSIS — L570 Actinic keratosis: Secondary | ICD-10-CM | POA: Diagnosis not present

## 2022-12-13 DIAGNOSIS — L814 Other melanin hyperpigmentation: Secondary | ICD-10-CM | POA: Diagnosis not present

## 2022-12-13 DIAGNOSIS — L821 Other seborrheic keratosis: Secondary | ICD-10-CM | POA: Diagnosis not present

## 2022-12-13 DIAGNOSIS — Z85828 Personal history of other malignant neoplasm of skin: Secondary | ICD-10-CM | POA: Diagnosis not present

## 2023-01-09 ENCOUNTER — Other Ambulatory Visit: Payer: Self-pay | Admitting: Cardiovascular Disease

## 2023-01-24 DIAGNOSIS — Z23 Encounter for immunization: Secondary | ICD-10-CM | POA: Diagnosis not present

## 2023-01-24 DIAGNOSIS — G959 Disease of spinal cord, unspecified: Secondary | ICD-10-CM | POA: Diagnosis not present

## 2023-02-21 DIAGNOSIS — N200 Calculus of kidney: Secondary | ICD-10-CM | POA: Diagnosis not present

## 2023-02-21 DIAGNOSIS — N400 Enlarged prostate without lower urinary tract symptoms: Secondary | ICD-10-CM | POA: Diagnosis not present

## 2023-02-21 DIAGNOSIS — N3943 Post-void dribbling: Secondary | ICD-10-CM | POA: Diagnosis not present

## 2023-03-23 DIAGNOSIS — C4442 Squamous cell carcinoma of skin of scalp and neck: Secondary | ICD-10-CM | POA: Diagnosis not present

## 2023-03-23 DIAGNOSIS — Z85828 Personal history of other malignant neoplasm of skin: Secondary | ICD-10-CM | POA: Diagnosis not present

## 2023-04-05 ENCOUNTER — Encounter: Payer: Self-pay | Admitting: Family Medicine

## 2023-04-05 DIAGNOSIS — I2510 Atherosclerotic heart disease of native coronary artery without angina pectoris: Secondary | ICD-10-CM | POA: Diagnosis not present

## 2023-04-05 DIAGNOSIS — R7301 Impaired fasting glucose: Secondary | ICD-10-CM | POA: Diagnosis not present

## 2023-04-05 DIAGNOSIS — N182 Chronic kidney disease, stage 2 (mild): Secondary | ICD-10-CM | POA: Diagnosis not present

## 2023-04-05 DIAGNOSIS — Z0000 Encounter for general adult medical examination without abnormal findings: Secondary | ICD-10-CM | POA: Diagnosis not present

## 2023-04-05 DIAGNOSIS — I119 Hypertensive heart disease without heart failure: Secondary | ICD-10-CM | POA: Diagnosis not present

## 2023-04-05 DIAGNOSIS — M1990 Unspecified osteoarthritis, unspecified site: Secondary | ICD-10-CM | POA: Diagnosis not present

## 2023-04-05 DIAGNOSIS — N400 Enlarged prostate without lower urinary tract symptoms: Secondary | ICD-10-CM | POA: Diagnosis not present

## 2023-04-05 DIAGNOSIS — E039 Hypothyroidism, unspecified: Secondary | ICD-10-CM | POA: Diagnosis not present

## 2023-04-05 DIAGNOSIS — E782 Mixed hyperlipidemia: Secondary | ICD-10-CM | POA: Diagnosis not present

## 2023-04-05 DIAGNOSIS — G4733 Obstructive sleep apnea (adult) (pediatric): Secondary | ICD-10-CM | POA: Diagnosis not present

## 2023-04-05 LAB — LAB REPORT - SCANNED
A1c: 5.9
EGFR: 85

## 2023-04-24 DIAGNOSIS — H52209 Unspecified astigmatism, unspecified eye: Secondary | ICD-10-CM | POA: Diagnosis not present

## 2023-04-24 DIAGNOSIS — H5203 Hypermetropia, bilateral: Secondary | ICD-10-CM | POA: Diagnosis not present

## 2023-04-24 DIAGNOSIS — H52223 Regular astigmatism, bilateral: Secondary | ICD-10-CM | POA: Diagnosis not present

## 2023-04-24 DIAGNOSIS — H524 Presbyopia: Secondary | ICD-10-CM | POA: Diagnosis not present

## 2023-05-18 DIAGNOSIS — G959 Disease of spinal cord, unspecified: Secondary | ICD-10-CM | POA: Diagnosis not present

## 2023-06-05 ENCOUNTER — Other Ambulatory Visit (HOSPITAL_COMMUNITY): Payer: Self-pay

## 2023-06-06 ENCOUNTER — Other Ambulatory Visit: Payer: Self-pay | Admitting: Cardiology

## 2023-06-06 ENCOUNTER — Other Ambulatory Visit (HOSPITAL_COMMUNITY): Payer: Self-pay

## 2023-06-06 MED ORDER — LEVOTHYROXINE SODIUM 112 MCG PO TABS
112.0000 ug | ORAL_TABLET | Freq: Every morning | ORAL | 0 refills | Status: DC
  Filled 2023-06-06: qty 90, 90d supply, fill #0

## 2023-06-07 ENCOUNTER — Other Ambulatory Visit (HOSPITAL_COMMUNITY): Payer: Self-pay

## 2023-06-07 ENCOUNTER — Other Ambulatory Visit: Payer: Self-pay

## 2023-06-07 MED ORDER — ENALAPRIL MALEATE 20 MG PO TABS
20.0000 mg | ORAL_TABLET | Freq: Every day | ORAL | 3 refills | Status: DC
  Filled 2023-06-07: qty 90, 90d supply, fill #0
  Filled 2023-09-01: qty 90, 90d supply, fill #1
  Filled 2023-11-27: qty 90, 90d supply, fill #2

## 2023-06-07 MED ORDER — ATORVASTATIN CALCIUM 10 MG PO TABS
10.0000 mg | ORAL_TABLET | Freq: Every day | ORAL | 3 refills | Status: DC
  Filled 2023-06-07: qty 90, 90d supply, fill #0
  Filled 2023-09-01: qty 90, 90d supply, fill #1
  Filled 2023-11-27: qty 90, 90d supply, fill #2

## 2023-06-08 ENCOUNTER — Other Ambulatory Visit (HOSPITAL_COMMUNITY): Payer: Self-pay

## 2023-06-09 ENCOUNTER — Other Ambulatory Visit (HOSPITAL_COMMUNITY): Payer: Self-pay

## 2023-06-10 ENCOUNTER — Other Ambulatory Visit (HOSPITAL_COMMUNITY): Payer: Self-pay

## 2023-06-10 MED ORDER — LEVOTHYROXINE SODIUM 125 MCG PO TABS
125.0000 ug | ORAL_TABLET | Freq: Every morning | ORAL | 3 refills | Status: DC
  Filled 2023-07-07: qty 90, 90d supply, fill #0

## 2023-06-10 MED ORDER — ENALAPRIL MALEATE 20 MG PO TABS
20.0000 mg | ORAL_TABLET | Freq: Every day | ORAL | 2 refills | Status: DC
Start: 2023-05-01 — End: 2024-01-04
  Filled 2023-07-07 – 2023-09-05 (×3): qty 100, 100d supply, fill #0

## 2023-06-10 MED ORDER — TAMSULOSIN HCL 0.4 MG PO CAPS
0.4000 mg | ORAL_CAPSULE | Freq: Every day | ORAL | 3 refills | Status: AC
  Filled 2023-06-10: qty 90, 90d supply, fill #0
  Filled 2023-09-05: qty 90, 90d supply, fill #1

## 2023-06-12 ENCOUNTER — Other Ambulatory Visit (HOSPITAL_COMMUNITY): Payer: Self-pay

## 2023-06-12 ENCOUNTER — Other Ambulatory Visit: Payer: Self-pay

## 2023-06-13 DIAGNOSIS — M4803 Spinal stenosis, cervicothoracic region: Secondary | ICD-10-CM | POA: Diagnosis not present

## 2023-06-13 DIAGNOSIS — E869 Volume depletion, unspecified: Secondary | ICD-10-CM | POA: Diagnosis not present

## 2023-06-13 DIAGNOSIS — M4802 Spinal stenosis, cervical region: Secondary | ICD-10-CM | POA: Diagnosis not present

## 2023-06-13 DIAGNOSIS — Z981 Arthrodesis status: Secondary | ICD-10-CM | POA: Diagnosis not present

## 2023-07-04 DIAGNOSIS — Z6825 Body mass index (BMI) 25.0-25.9, adult: Secondary | ICD-10-CM | POA: Diagnosis not present

## 2023-07-04 DIAGNOSIS — G959 Disease of spinal cord, unspecified: Secondary | ICD-10-CM | POA: Diagnosis not present

## 2023-07-06 ENCOUNTER — Other Ambulatory Visit (HOSPITAL_COMMUNITY): Payer: Self-pay

## 2023-07-06 ENCOUNTER — Other Ambulatory Visit: Payer: Self-pay

## 2023-07-06 ENCOUNTER — Other Ambulatory Visit (HOSPITAL_BASED_OUTPATIENT_CLINIC_OR_DEPARTMENT_OTHER): Payer: Self-pay

## 2023-07-06 MED ORDER — METHYLPREDNISOLONE 4 MG PO TBPK
ORAL_TABLET | ORAL | 0 refills | Status: DC
  Filled 2023-07-06 – 2023-07-07 (×2): qty 21, 6d supply, fill #0

## 2023-07-07 ENCOUNTER — Other Ambulatory Visit: Payer: Self-pay

## 2023-07-07 ENCOUNTER — Other Ambulatory Visit (HOSPITAL_COMMUNITY): Payer: Self-pay

## 2023-07-07 MED FILL — Metoprolol Succinate Tab ER 24HR 25 MG (Tartrate Equiv): ORAL | 90 days supply | Qty: 90 | Fill #0 | Status: AC

## 2023-07-13 ENCOUNTER — Other Ambulatory Visit: Payer: Self-pay

## 2023-07-28 DIAGNOSIS — E039 Hypothyroidism, unspecified: Secondary | ICD-10-CM | POA: Diagnosis not present

## 2023-08-01 ENCOUNTER — Other Ambulatory Visit (HOSPITAL_COMMUNITY): Payer: Self-pay

## 2023-08-02 ENCOUNTER — Other Ambulatory Visit (HOSPITAL_COMMUNITY): Payer: Self-pay

## 2023-08-02 MED ORDER — LEVOTHYROXINE SODIUM 112 MCG PO TABS
112.0000 ug | ORAL_TABLET | Freq: Every morning | ORAL | 0 refills | Status: DC
  Filled 2023-08-02 – 2023-08-28 (×2): qty 90, 90d supply, fill #0

## 2023-08-09 DIAGNOSIS — G4733 Obstructive sleep apnea (adult) (pediatric): Secondary | ICD-10-CM | POA: Diagnosis not present

## 2023-08-09 DIAGNOSIS — I119 Hypertensive heart disease without heart failure: Secondary | ICD-10-CM | POA: Diagnosis not present

## 2023-08-28 ENCOUNTER — Other Ambulatory Visit: Payer: Self-pay

## 2023-08-28 ENCOUNTER — Other Ambulatory Visit (HOSPITAL_COMMUNITY): Payer: Self-pay

## 2023-09-01 ENCOUNTER — Other Ambulatory Visit (HOSPITAL_COMMUNITY): Payer: Self-pay

## 2023-09-01 ENCOUNTER — Other Ambulatory Visit: Payer: Self-pay

## 2023-09-05 ENCOUNTER — Other Ambulatory Visit (HOSPITAL_COMMUNITY): Payer: Self-pay

## 2023-09-05 ENCOUNTER — Other Ambulatory Visit: Payer: Self-pay

## 2023-09-14 DIAGNOSIS — M25512 Pain in left shoulder: Secondary | ICD-10-CM | POA: Diagnosis not present

## 2023-10-04 DIAGNOSIS — N400 Enlarged prostate without lower urinary tract symptoms: Secondary | ICD-10-CM | POA: Diagnosis not present

## 2023-10-04 DIAGNOSIS — N182 Chronic kidney disease, stage 2 (mild): Secondary | ICD-10-CM | POA: Diagnosis not present

## 2023-10-04 DIAGNOSIS — E782 Mixed hyperlipidemia: Secondary | ICD-10-CM | POA: Diagnosis not present

## 2023-10-04 DIAGNOSIS — I2510 Atherosclerotic heart disease of native coronary artery without angina pectoris: Secondary | ICD-10-CM | POA: Diagnosis not present

## 2023-10-04 DIAGNOSIS — I119 Hypertensive heart disease without heart failure: Secondary | ICD-10-CM | POA: Diagnosis not present

## 2023-10-04 DIAGNOSIS — E039 Hypothyroidism, unspecified: Secondary | ICD-10-CM | POA: Diagnosis not present

## 2023-10-04 DIAGNOSIS — R7301 Impaired fasting glucose: Secondary | ICD-10-CM | POA: Diagnosis not present

## 2023-10-04 LAB — LAB REPORT - SCANNED
A1c: 5.9
Creatinine, POC: 103 mg/dL
EGFR: 80
Microalb Creat Ratio: 128.8
Microalbumin, Urine: 13.3

## 2023-10-05 ENCOUNTER — Encounter: Payer: Self-pay | Admitting: Family Medicine

## 2023-10-10 DIAGNOSIS — M25512 Pain in left shoulder: Secondary | ICD-10-CM | POA: Diagnosis not present

## 2023-10-10 DIAGNOSIS — M5417 Radiculopathy, lumbosacral region: Secondary | ICD-10-CM | POA: Diagnosis not present

## 2023-10-10 DIAGNOSIS — G609 Hereditary and idiopathic neuropathy, unspecified: Secondary | ICD-10-CM | POA: Diagnosis not present

## 2023-10-10 MED FILL — Metoprolol Succinate Tab ER 24HR 25 MG (Tartrate Equiv): ORAL | 90 days supply | Qty: 90 | Fill #1 | Status: AC

## 2023-10-11 ENCOUNTER — Other Ambulatory Visit (HOSPITAL_COMMUNITY): Payer: Self-pay

## 2023-11-16 ENCOUNTER — Other Ambulatory Visit (HOSPITAL_COMMUNITY): Payer: Self-pay

## 2023-11-16 MED ORDER — LEVOTHYROXINE SODIUM 112 MCG PO TABS
112.0000 ug | ORAL_TABLET | Freq: Every morning | ORAL | 1 refills | Status: DC
  Filled 2023-11-16: qty 100, 100d supply, fill #0
  Filled 2024-02-18: qty 100, 100d supply, fill #1

## 2023-11-17 ENCOUNTER — Other Ambulatory Visit: Payer: Self-pay

## 2023-12-13 DIAGNOSIS — Z85828 Personal history of other malignant neoplasm of skin: Secondary | ICD-10-CM | POA: Diagnosis not present

## 2023-12-13 DIAGNOSIS — L814 Other melanin hyperpigmentation: Secondary | ICD-10-CM | POA: Diagnosis not present

## 2023-12-13 DIAGNOSIS — L821 Other seborrheic keratosis: Secondary | ICD-10-CM | POA: Diagnosis not present

## 2023-12-13 DIAGNOSIS — L570 Actinic keratosis: Secondary | ICD-10-CM | POA: Diagnosis not present

## 2023-12-13 DIAGNOSIS — D225 Melanocytic nevi of trunk: Secondary | ICD-10-CM | POA: Diagnosis not present

## 2024-01-03 ENCOUNTER — Other Ambulatory Visit: Payer: Self-pay | Admitting: Cardiovascular Disease

## 2024-01-04 ENCOUNTER — Other Ambulatory Visit: Payer: Self-pay

## 2024-01-04 ENCOUNTER — Other Ambulatory Visit (HOSPITAL_COMMUNITY): Payer: Self-pay

## 2024-01-04 MED ORDER — ATORVASTATIN CALCIUM 10 MG PO TABS
10.0000 mg | ORAL_TABLET | Freq: Every day | ORAL | 0 refills | Status: DC
  Filled 2024-01-04 – 2024-02-21 (×2): qty 30, 30d supply, fill #0

## 2024-01-04 MED ORDER — METOPROLOL SUCCINATE ER 25 MG PO TB24
25.0000 mg | ORAL_TABLET | Freq: Every day | ORAL | 0 refills | Status: DC
  Filled 2024-01-04: qty 30, 30d supply, fill #0

## 2024-01-04 MED ORDER — ENALAPRIL MALEATE 20 MG PO TABS
20.0000 mg | ORAL_TABLET | Freq: Every day | ORAL | 0 refills | Status: DC
  Filled 2024-01-04 – 2024-02-21 (×2): qty 30, 30d supply, fill #0

## 2024-02-02 ENCOUNTER — Other Ambulatory Visit: Payer: Self-pay

## 2024-02-02 ENCOUNTER — Other Ambulatory Visit: Payer: Self-pay | Admitting: Cardiology

## 2024-02-02 MED ORDER — METOPROLOL SUCCINATE ER 25 MG PO TB24
25.0000 mg | ORAL_TABLET | Freq: Every day | ORAL | 1 refills | Status: DC
  Filled 2024-02-02: qty 30, 30d supply, fill #0
  Filled 2024-02-21 – 2024-03-02 (×2): qty 30, 30d supply, fill #1

## 2024-02-21 ENCOUNTER — Other Ambulatory Visit: Payer: Self-pay

## 2024-02-21 ENCOUNTER — Other Ambulatory Visit (HOSPITAL_COMMUNITY): Payer: Self-pay

## 2024-02-21 MED ORDER — LEVOTHYROXINE SODIUM 112 MCG PO TABS
112.0000 ug | ORAL_TABLET | Freq: Every morning | ORAL | 0 refills | Status: AC
  Filled 2024-02-21 – 2024-02-27 (×7): qty 100, 100d supply, fill #0

## 2024-02-22 ENCOUNTER — Other Ambulatory Visit: Payer: Self-pay

## 2024-02-23 ENCOUNTER — Other Ambulatory Visit (HOSPITAL_COMMUNITY): Payer: Self-pay

## 2024-02-24 ENCOUNTER — Other Ambulatory Visit (HOSPITAL_COMMUNITY): Payer: Self-pay

## 2024-02-25 ENCOUNTER — Other Ambulatory Visit (HOSPITAL_COMMUNITY): Payer: Self-pay

## 2024-02-26 ENCOUNTER — Other Ambulatory Visit (HOSPITAL_COMMUNITY): Payer: Self-pay

## 2024-02-26 DIAGNOSIS — R3912 Poor urinary stream: Secondary | ICD-10-CM | POA: Diagnosis not present

## 2024-02-26 DIAGNOSIS — R399 Unspecified symptoms and signs involving the genitourinary system: Secondary | ICD-10-CM | POA: Diagnosis not present

## 2024-02-26 DIAGNOSIS — Z125 Encounter for screening for malignant neoplasm of prostate: Secondary | ICD-10-CM | POA: Diagnosis not present

## 2024-02-27 ENCOUNTER — Other Ambulatory Visit: Payer: Self-pay

## 2024-03-07 ENCOUNTER — Other Ambulatory Visit (HOSPITAL_COMMUNITY): Payer: Self-pay

## 2024-03-08 ENCOUNTER — Other Ambulatory Visit (HOSPITAL_COMMUNITY): Payer: Self-pay

## 2024-03-08 MED ORDER — TAMSULOSIN HCL 0.4 MG PO CAPS
0.4000 mg | ORAL_CAPSULE | Freq: Every day | ORAL | 3 refills | Status: AC
  Filled 2024-03-08: qty 90, 90d supply, fill #0
  Filled 2024-06-03: qty 90, 90d supply, fill #1

## 2024-03-11 ENCOUNTER — Other Ambulatory Visit: Payer: Self-pay

## 2024-03-22 ENCOUNTER — Other Ambulatory Visit: Payer: Self-pay | Admitting: Cardiology

## 2024-03-25 ENCOUNTER — Other Ambulatory Visit (HOSPITAL_COMMUNITY): Payer: Self-pay

## 2024-03-25 MED ORDER — ENALAPRIL MALEATE 20 MG PO TABS
20.0000 mg | ORAL_TABLET | Freq: Every day | ORAL | 0 refills | Status: DC
  Filled 2024-03-25: qty 15, 15d supply, fill #0

## 2024-03-25 MED ORDER — ATORVASTATIN CALCIUM 10 MG PO TABS
10.0000 mg | ORAL_TABLET | Freq: Every day | ORAL | 0 refills | Status: DC
  Filled 2024-03-25: qty 15, 15d supply, fill #0

## 2024-03-27 ENCOUNTER — Encounter: Payer: Self-pay | Admitting: Cardiology

## 2024-03-27 ENCOUNTER — Ambulatory Visit: Attending: Cardiology | Admitting: Cardiology

## 2024-03-27 ENCOUNTER — Other Ambulatory Visit: Payer: Self-pay | Admitting: Cardiology

## 2024-03-27 VITALS — BP 146/68 | HR 58 | Ht 67.0 in | Wt 163.2 lb

## 2024-03-27 DIAGNOSIS — R002 Palpitations: Secondary | ICD-10-CM

## 2024-03-27 DIAGNOSIS — I2510 Atherosclerotic heart disease of native coronary artery without angina pectoris: Secondary | ICD-10-CM

## 2024-03-27 DIAGNOSIS — E785 Hyperlipidemia, unspecified: Secondary | ICD-10-CM

## 2024-03-27 DIAGNOSIS — G4733 Obstructive sleep apnea (adult) (pediatric): Secondary | ICD-10-CM | POA: Diagnosis not present

## 2024-03-27 DIAGNOSIS — I10 Essential (primary) hypertension: Secondary | ICD-10-CM

## 2024-03-27 MED ORDER — METOPROLOL SUCCINATE ER 25 MG PO TB24
25.0000 mg | ORAL_TABLET | Freq: Every day | ORAL | 1 refills | Status: AC
  Filled 2024-03-27: qty 90, 90d supply, fill #0

## 2024-03-27 NOTE — Progress Notes (Signed)
 Cardiology Office Note:  .   Date:  04/06/2024  ID:  [name] [name]., DOB 03/13/1941, MRN [id number] PCP: Loreli Kins, MD  [facility] Providers Cardiologist:  Alm Clay, MD     Chief Complaint  Patient presents with   Follow-up    Delayed annual follow-up   Coronary Artery Disease    No active angina.   Hypertension    Home blood pressure monitoring log reviewed.    Patient Profile: .     [name] [name]. is a  83 y.o. male with a PMH notable CAD, HTN HLD (reviewed below) who presents here for 65-month follow-up at the request of Loreli Kins, MD.  PHM CAD: 1990 => RCA PTCA -> 95% proximal and 90% distal RCA lesion treated with PTCA only. 2001 recurrent Angina --> CABGx3 (LIMA-LAD, SVG-OM, SVG-RPDA) RE-look Cath in [id number]: Patent LIMA-LAD, small non-intervenable D1 ostial 70%; patent SVG-OM w/ retrograde flow to the native LCx& 90% stenosis in main Cx. 60-70% lesion in AVG Cx. Patent SVG-RPDA with retrograde flow filling the PL system HTN & HLD Likely PSVT    [name] [name]. was last seen on June 20, 2022: Overall feeling well.  He just had neck surgery he had 1 episode where he felt his heart rate going fast over the previous 3 months that lasted about 1-2 minute and cleared with vagal maneuvers.  Otherwise his baseline heart rate in the 60s.  Denied a chest pain or pressure associated with these episodes.  No heart failure or angina.  Was active walking the dogs and doing yard work.  No changes made.  Plan was 41-month follow-up with APP.  Subjective  Discussed the use of AI scribe software for clinical note transcription with the patient, who gave verbal consent to proceed.  History of Present Illness [name] [name]. is an 83 year old male with coronary artery disease and hypertension who presents for a delayed follow-up visit.  He experiences elevated blood pressure readings at the doctor's office, while at home his blood pressure  typically runs around 130-140 mmHg systolic. He is on enalapril  20 mg, metoprolol  succinate 25 mg, and aspirin  81 mg for blood pressure management.  He underwent coronary artery bypass grafting in 2001 and has had a stress test in the past. He describes episodes of palpitations, feeling like his heart 'was getting take off,' which have not occurred in the past few months. These episodes last 10-15 minutes, with one lasting 45 minutes, relieved by coughing or drinking cold water . No chest pain, pressure, or tightness during physical activities.  No shortness of breath, orthopnea, paroxysmal nocturnal dyspnea, headaches, blurred vision, dizziness, stroke-like symptoms, or syncope. He remains active, engaging in yard work and playing golf without significant limitations.  No gastrointestinal bleeding, hematuria, or epistaxis. He notes occasional minor discoloration in his urine but does not find it concerning. No leg swelling or claudication, but his legs feel tired after physical activity.  Recent blood work from May shows well-controlled cholesterol levels with a total cholesterol of 136 mg/dL, HDL of 54 mg/dL, LDL of 69 mg/dL, triglycerides of 65 mg/dL, and an J8r of 4.0%. He is taking Lipitor 10 mg with CoQ10 300 mg, omega fatty acid capsules, Synthroid  112 mcg, and Flomax  at night.   Cardiovascular ROS: no chest pain or dyspnea on exertion positive for - palpitations, rapid heart rate, and these are rare & last ~10-15 min - usually break with vagal maneuvers negative for -  edema, loss of consciousness, orthopnea, palpitations, paroxysmal nocturnal dyspnea, rapid heart rate, shortness of breath, or syncope/near syncope or TIA/or arthrosis fugax; claudication  ROS:  Review of Systems - Negative except rare hematuria    Objective   Current Meds: Aspirin  81 mg daily, atorvastatin  10 mg daily, co-Q10 3 mg daily, fish oil caplets daily, Synthroid  112 mcg daily, Toprol -XL preferably daily, tamsulosin   0.4 mg daily.  Studies Reviewed: SABRA   EKG Interpretation Date/Time:  Wednesday March 27 2024 10:57:48 EST Ventricular Rate:  58 PR Interval:  170 QRS Duration:  178 QT Interval:  458 QTC Calculation: 449 R Axis:   -52  Text Interpretation: Sinus bradycardia Left axis deviation Right bundle branch block Minimal voltage criteria for LVH, may be normal variant ( R in aVL ) Septal infarct (cited on or before 18-May-2000) When compared with ECG of 24-May-2012 10:08, Premature ventricular complexes are no longer Present Confirmed by Anner Lenis ([id number]) on 03/27/2024 11:41:49 AM    Results LABS Cholesterol: 136 (10/04/2023) HDL: 54 (10/04/2023) LDL: 69 (10/04/2023) Triglycerides: 65 (10/04/2023) A1c: 5.9 (10/04/2023) Creatinine: 1.03 (10/04/2023) Potassium: 4.8 (10/04/2023) TSH: 1.28 (10/04/2023)  DIAGNOSTIC ECG: Bundle branch block, no ectopic beats NO RECENT SUDIES   Risk Assessment/Calculations:     HYPERTENSION CONTROL Vitals:   03/27/24 1048 03/27/24 1151  BP: (!) 174/70 (!) 146/68    The patient's blood pressure is elevated above target today.  In order to address the patient's elevated BP: The blood pressure is usually elevated in clinic.  Blood pressures monitored at home have been optimal.; Blood pressure will be monitored at home to determine if medication changes need to be made.          Physical Exam:   VS:  BP (!) 146/68   Pulse (!) 58   Ht 5' 7 (1.702 m)   Wt 163 lb 3.2 oz (74 kg)   SpO2 96%   BMI 25.56 kg/m    Wt Readings from Last 3 Encounters:  03/27/24 163 lb 3.2 oz (74 kg)  06/20/22 167 lb 3.2 oz (75.8 kg)  12/17/21 140 lb (63.5 kg)      GEN: Healthy-Appearing.  Well-groomed.  No acute distress. NECK: No JVD; No carotid bruits CARDIAC: Normal S1, S2; RRR, no murmurs, rubs, gallops RESPIRATORY:  Clear to auscultation without rales, wheezing or rhonchi ; nonlabored, good air movement. ABDOMEN: Soft, non-tender, non-distended EXTREMITIES:   No edema; No deformity      ASSESSMENT AND PLAN: .    Problem List Items Addressed This Visit       Cardiology Problems   CAD in native artery -- status post CABG x3 (LIMA-LAD, SVG RPDA, SVG-OM) - Primary (Chronic)   Coronary artery bypass grafting in 2001. EKG shows persistent bundle branch block. Cholesterol levels controlled on statin. - Continue aspirin  81 mg, atorvastatin  10 mg, fish oil caplets and co-Q10. - Continue low-dose Toprol -XL 25 mg and enalapril  20 mg daily      Relevant Orders   EKG 12-Lead (Completed)   Essential hypertension (Chronic)   Blood pressure elevated in office, typically 130s-140s at home. Possible white coat hypertension. No symptoms reported. - Monitor blood pressure at home twice daily for two weeks. - Record blood pressure and heart rate readings and bring log to primary care appointment. - Discuss potential medication adjustment if home readings are consistently high.      Relevant Orders   EKG 12-Lead (Completed)   Hyperlipidemia with target LDL less than 70 (Chronic)  Last LDL was 69.  Premature at goal given his age.  Tolerating 10 mg atorvastatin , fish oil caplets and co-Q10.  No myalgias. -Continue current regimen.        Other   Palpitations; ? short runs of SVT/PAT (Chronic)   Pretty much stable on beta-blocker.      Sleep apnea (Chronic)   No indications of is not using CPAP.               Follow-Up: Return in about 1 year (around 03/27/2025) for Routine follow up with me, [employer].     Signed, Alm MICAEL Clay, MD, MS Alm Clay, M.D., M.S. Interventional Cardiologist  [facility] Pager # [phone]

## 2024-03-27 NOTE — Patient Instructions (Signed)
 Medication Instructions:  Your physician recommends that you continue on your current medications as directed. Please refer to the Current Medication list given to you today.  *If you need a refill on your cardiac medications before your next appointment, please call your pharmacy*  Lab Work: None ordered  Testing/Procedures: None ordered  Follow-Up: At [facility], you and your health needs are our priority.  As part of our continuing mission to provide you with exceptional heart care, our providers are all part of one team.  This team includes your primary Cardiologist (physician) and Advanced Practice Providers or APPs (Physician Assistants and Nurse Practitioners) who all work together to provide you with the care you need, when you need it.  Your next appointment:   1 year(s)  Provider:   Alm Clay, MD     Thank you for choosing [facility]!!   [phone]   Other Instructions  Keep track of your blood pressure at home for the next week or two.  If staying elevated at home (greater than 130/80) call your primary care provider to discuss further.

## 2024-03-28 ENCOUNTER — Other Ambulatory Visit (HOSPITAL_COMMUNITY): Payer: Self-pay

## 2024-03-28 ENCOUNTER — Other Ambulatory Visit: Payer: Self-pay

## 2024-04-05 ENCOUNTER — Other Ambulatory Visit: Payer: Self-pay | Admitting: Cardiology

## 2024-04-06 ENCOUNTER — Encounter: Payer: Self-pay | Admitting: Cardiology

## 2024-04-06 NOTE — Assessment & Plan Note (Signed)
 Last LDL was 69.  Premature at goal given his age.  Tolerating 10 mg atorvastatin , fish oil caplets and co-Q10.  No myalgias. -Continue current regimen.

## 2024-04-06 NOTE — Assessment & Plan Note (Signed)
 Coronary artery bypass grafting in 2001. EKG shows persistent bundle branch block. Cholesterol levels controlled on statin. - Continue aspirin  81 mg, atorvastatin  10 mg, fish oil caplets and co-Q10. - Continue low-dose Toprol -XL 25 mg and enalapril  20 mg daily

## 2024-04-06 NOTE — Assessment & Plan Note (Signed)
 Pretty much stable on beta-blocker.

## 2024-04-06 NOTE — Assessment & Plan Note (Signed)
 No indications of is not using CPAP.

## 2024-04-06 NOTE — Assessment & Plan Note (Signed)
 Blood pressure elevated in office, typically 130s-140s at home. Possible white coat hypertension. No symptoms reported. - Monitor blood pressure at home twice daily for two weeks. - Record blood pressure and heart rate readings and bring log to primary care appointment. - Discuss potential medication adjustment if home readings are consistently high.

## 2024-04-09 ENCOUNTER — Other Ambulatory Visit: Payer: Self-pay

## 2024-04-09 ENCOUNTER — Other Ambulatory Visit (HOSPITAL_COMMUNITY): Payer: Self-pay

## 2024-04-09 MED ORDER — ATORVASTATIN CALCIUM 10 MG PO TABS
10.0000 mg | ORAL_TABLET | Freq: Every day | ORAL | 1 refills | Status: AC
  Filled 2024-04-09: qty 90, 90d supply, fill #0

## 2024-04-09 MED ORDER — ENALAPRIL MALEATE 20 MG PO TABS
20.0000 mg | ORAL_TABLET | Freq: Every day | ORAL | 1 refills | Status: AC
  Filled 2024-04-09 (×2): qty 90, 90d supply, fill #0

## 2024-04-16 DIAGNOSIS — M1990 Unspecified osteoarthritis, unspecified site: Secondary | ICD-10-CM | POA: Diagnosis not present

## 2024-04-16 DIAGNOSIS — R7301 Impaired fasting glucose: Secondary | ICD-10-CM | POA: Diagnosis not present

## 2024-04-16 DIAGNOSIS — E782 Mixed hyperlipidemia: Secondary | ICD-10-CM | POA: Diagnosis not present

## 2024-04-16 DIAGNOSIS — N400 Enlarged prostate without lower urinary tract symptoms: Secondary | ICD-10-CM | POA: Diagnosis not present

## 2024-04-16 DIAGNOSIS — I119 Hypertensive heart disease without heart failure: Secondary | ICD-10-CM | POA: Diagnosis not present

## 2024-04-16 DIAGNOSIS — Z1331 Encounter for screening for depression: Secondary | ICD-10-CM | POA: Diagnosis not present

## 2024-04-16 DIAGNOSIS — G4733 Obstructive sleep apnea (adult) (pediatric): Secondary | ICD-10-CM | POA: Diagnosis not present

## 2024-04-16 DIAGNOSIS — E039 Hypothyroidism, unspecified: Secondary | ICD-10-CM | POA: Diagnosis not present

## 2024-04-16 DIAGNOSIS — Z0000 Encounter for general adult medical examination without abnormal findings: Secondary | ICD-10-CM | POA: Diagnosis not present

## 2024-04-16 DIAGNOSIS — I2510 Atherosclerotic heart disease of native coronary artery without angina pectoris: Secondary | ICD-10-CM | POA: Diagnosis not present

## 2024-04-16 DIAGNOSIS — N182 Chronic kidney disease, stage 2 (mild): Secondary | ICD-10-CM | POA: Diagnosis not present

## 2024-04-16 LAB — LAB REPORT - SCANNED
A1c: 5.8
EGFR: 76

## 2024-04-23 ENCOUNTER — Other Ambulatory Visit (HOSPITAL_COMMUNITY): Payer: Self-pay

## 2024-04-23 MED ORDER — HEMICLOR 12.5 MG PO TABS
12.5000 mg | ORAL_TABLET | Freq: Every morning | ORAL | 1 refills | Status: AC
  Filled 2024-04-23 – 2024-04-25 (×2): qty 30, 30d supply, fill #0

## 2024-04-24 ENCOUNTER — Other Ambulatory Visit (HOSPITAL_COMMUNITY): Payer: Self-pay

## 2024-04-24 ENCOUNTER — Other Ambulatory Visit: Payer: Self-pay

## 2024-04-24 MED ORDER — CHLORTHALIDONE 25 MG PO TABS
12.5000 mg | ORAL_TABLET | Freq: Every day | ORAL | 1 refills | Status: AC
  Filled 2024-04-24 (×2): qty 45, 90d supply, fill #0

## 2024-04-25 ENCOUNTER — Other Ambulatory Visit: Payer: Self-pay

## 2024-04-25 ENCOUNTER — Other Ambulatory Visit (HOSPITAL_COMMUNITY): Payer: Self-pay

## 2024-06-03 ENCOUNTER — Other Ambulatory Visit (HOSPITAL_COMMUNITY): Payer: Self-pay

## 2024-06-03 ENCOUNTER — Encounter (HOSPITAL_COMMUNITY): Payer: Self-pay

## 2024-06-03 ENCOUNTER — Other Ambulatory Visit: Payer: Self-pay

## 2024-06-04 ENCOUNTER — Other Ambulatory Visit: Payer: Self-pay
